# Patient Record
Sex: Female | Born: 1955 | Race: White | Hispanic: No | Marital: Married | Smoking: Former smoker
Health system: Southern US, Community
[De-identification: ages and names within clinical notes are randomized; demographics above are authoritative.]

## PROBLEM LIST (undated history)

## (undated) DIAGNOSIS — Z720 Tobacco use: Secondary | ICD-10-CM

## (undated) DIAGNOSIS — I214 Non-ST elevation (NSTEMI) myocardial infarction: Secondary | ICD-10-CM

## (undated) DIAGNOSIS — E785 Hyperlipidemia, unspecified: Secondary | ICD-10-CM

## (undated) DIAGNOSIS — I509 Heart failure, unspecified: Secondary | ICD-10-CM

## (undated) DIAGNOSIS — M1990 Unspecified osteoarthritis, unspecified site: Secondary | ICD-10-CM

## (undated) DIAGNOSIS — J8410 Pulmonary fibrosis, unspecified: Secondary | ICD-10-CM

## (undated) DIAGNOSIS — J439 Emphysema, unspecified: Secondary | ICD-10-CM

## (undated) HISTORY — PX: SPINE SURGERY: SHX786

## (undated) HISTORY — DX: Emphysema, unspecified: J43.9

## (undated) HISTORY — DX: Non-ST elevation (NSTEMI) myocardial infarction: I21.4

## (undated) HISTORY — DX: Hyperlipidemia, unspecified: E78.5

## (undated) HISTORY — DX: Heart failure, unspecified: I50.9

## (undated) HISTORY — DX: Unspecified osteoarthritis, unspecified site: M19.90

---

## 1998-05-20 ENCOUNTER — Other Ambulatory Visit: Admission: RE | Admit: 1998-05-20 | Discharge: 1998-05-20 | Payer: Self-pay | Admitting: Family Medicine

## 1999-04-29 ENCOUNTER — Encounter: Payer: Self-pay | Admitting: Internal Medicine

## 1999-04-29 ENCOUNTER — Emergency Department (HOSPITAL_COMMUNITY): Admission: EM | Admit: 1999-04-29 | Discharge: 1999-04-29 | Payer: Self-pay | Admitting: Emergency Medicine

## 1999-05-06 ENCOUNTER — Ambulatory Visit (HOSPITAL_COMMUNITY): Admission: RE | Admit: 1999-05-06 | Discharge: 1999-05-06 | Payer: Self-pay | Admitting: Neurosurgery

## 1999-05-06 ENCOUNTER — Encounter: Payer: Self-pay | Admitting: Neurosurgery

## 1999-05-20 ENCOUNTER — Encounter: Payer: Self-pay | Admitting: Neurosurgery

## 1999-05-20 ENCOUNTER — Ambulatory Visit (HOSPITAL_COMMUNITY): Admission: RE | Admit: 1999-05-20 | Discharge: 1999-05-20 | Payer: Self-pay | Admitting: Neurosurgery

## 1999-06-03 ENCOUNTER — Encounter: Payer: Self-pay | Admitting: Neurosurgery

## 1999-06-03 ENCOUNTER — Ambulatory Visit (HOSPITAL_COMMUNITY): Admission: RE | Admit: 1999-06-03 | Discharge: 1999-06-03 | Payer: Self-pay | Admitting: Neurosurgery

## 1999-07-28 ENCOUNTER — Encounter: Admission: RE | Admit: 1999-07-28 | Discharge: 1999-08-23 | Payer: Self-pay | Admitting: Neurosurgery

## 2001-02-03 ENCOUNTER — Ambulatory Visit (HOSPITAL_COMMUNITY): Admission: RE | Admit: 2001-02-03 | Discharge: 2001-02-03 | Payer: Self-pay | Admitting: Neurosurgery

## 2001-02-03 ENCOUNTER — Encounter: Payer: Self-pay | Admitting: Neurosurgery

## 2012-02-15 ENCOUNTER — Other Ambulatory Visit: Payer: Self-pay | Admitting: Orthopaedic Surgery

## 2012-02-15 DIAGNOSIS — M549 Dorsalgia, unspecified: Secondary | ICD-10-CM

## 2012-03-01 ENCOUNTER — Ambulatory Visit
Admission: RE | Admit: 2012-03-01 | Discharge: 2012-03-01 | Disposition: A | Discharge status: Home / Self Care | Payer: No Typology Code available for payment source | Source: Ambulatory Visit | Attending: Orthopaedic Surgery | Admitting: Orthopaedic Surgery

## 2012-03-01 VITALS — BP 93/51 | HR 73 | Ht 62.0 in | Wt 140.0 lb

## 2012-03-01 DIAGNOSIS — M549 Dorsalgia, unspecified: Secondary | ICD-10-CM

## 2012-03-01 MED ORDER — DIAZEPAM 5 MG PO TABS
5.0000 mg | ORAL_TABLET | Freq: Once | ORAL | Status: AC
  Administered 2012-03-01: 5 mg via ORAL

## 2012-03-01 MED ORDER — HYDROMORPHONE HCL PF 1 MG/ML IJ SOLN
1.0000 mg | Freq: Once | INTRAMUSCULAR | Status: AC
  Administered 2012-03-01: 1 mg via INTRAMUSCULAR

## 2012-03-01 MED ORDER — ONDANSETRON HCL 4 MG/2ML IJ SOLN
4.0000 mg | Freq: Once | INTRAMUSCULAR | Status: AC
  Administered 2012-03-01: 4 mg via INTRAMUSCULAR

## 2012-03-01 MED ORDER — IOHEXOL 180 MG/ML  SOLN
15.0000 mL | Freq: Once | INTRAMUSCULAR | Status: AC | PRN
  Administered 2012-03-01: 15 mL via INTRATHECAL

## 2012-03-01 NOTE — Discharge Instructions (Signed)

## 2012-11-06 ENCOUNTER — Ambulatory Visit: Payer: Managed Care, Other (non HMO) | Attending: Orthopaedic Surgery | Admitting: Physical Therapy

## 2012-11-06 DIAGNOSIS — IMO0001 Reserved for inherently not codable concepts without codable children: Secondary | ICD-10-CM | POA: Insufficient documentation

## 2012-11-06 DIAGNOSIS — R5381 Other malaise: Secondary | ICD-10-CM | POA: Insufficient documentation

## 2012-11-06 DIAGNOSIS — R293 Abnormal posture: Secondary | ICD-10-CM | POA: Insufficient documentation

## 2012-11-06 DIAGNOSIS — M545 Low back pain, unspecified: Secondary | ICD-10-CM | POA: Insufficient documentation

## 2012-11-11 ENCOUNTER — Encounter: Payer: BC Managed Care – PPO | Admitting: Physical Therapy

## 2012-11-13 ENCOUNTER — Ambulatory Visit: Payer: Managed Care, Other (non HMO) | Attending: Orthopaedic Surgery | Admitting: Physical Therapy

## 2012-11-13 DIAGNOSIS — R5381 Other malaise: Secondary | ICD-10-CM | POA: Insufficient documentation

## 2012-11-13 DIAGNOSIS — R293 Abnormal posture: Secondary | ICD-10-CM | POA: Insufficient documentation

## 2012-11-13 DIAGNOSIS — IMO0001 Reserved for inherently not codable concepts without codable children: Secondary | ICD-10-CM | POA: Insufficient documentation

## 2012-11-13 DIAGNOSIS — M545 Low back pain, unspecified: Secondary | ICD-10-CM | POA: Insufficient documentation

## 2012-11-19 ENCOUNTER — Ambulatory Visit: Payer: Managed Care, Other (non HMO) | Admitting: Physical Therapy

## 2012-11-21 ENCOUNTER — Ambulatory Visit: Payer: Managed Care, Other (non HMO) | Admitting: Physical Therapy

## 2012-11-28 ENCOUNTER — Ambulatory Visit: Payer: Managed Care, Other (non HMO) | Admitting: Physical Therapy

## 2012-12-03 ENCOUNTER — Ambulatory Visit: Payer: Managed Care, Other (non HMO) | Admitting: Physical Therapy

## 2012-12-05 ENCOUNTER — Ambulatory Visit: Payer: Managed Care, Other (non HMO) | Admitting: Physical Therapy

## 2012-12-10 ENCOUNTER — Ambulatory Visit: Payer: Managed Care, Other (non HMO) | Attending: Orthopaedic Surgery | Admitting: Physical Therapy

## 2012-12-10 DIAGNOSIS — M545 Low back pain, unspecified: Secondary | ICD-10-CM | POA: Insufficient documentation

## 2012-12-10 DIAGNOSIS — IMO0001 Reserved for inherently not codable concepts without codable children: Secondary | ICD-10-CM | POA: Insufficient documentation

## 2012-12-10 DIAGNOSIS — R293 Abnormal posture: Secondary | ICD-10-CM | POA: Insufficient documentation

## 2012-12-10 DIAGNOSIS — R5381 Other malaise: Secondary | ICD-10-CM | POA: Insufficient documentation

## 2012-12-12 ENCOUNTER — Ambulatory Visit: Payer: Managed Care, Other (non HMO) | Admitting: Physical Therapy

## 2012-12-17 ENCOUNTER — Ambulatory Visit: Payer: Managed Care, Other (non HMO) | Admitting: Physical Therapy

## 2012-12-19 ENCOUNTER — Ambulatory Visit: Payer: Managed Care, Other (non HMO) | Admitting: Physical Therapy

## 2012-12-24 ENCOUNTER — Encounter: Payer: BC Managed Care – PPO | Admitting: Physical Therapy

## 2012-12-26 ENCOUNTER — Encounter: Payer: BC Managed Care – PPO | Admitting: Physical Therapy

## 2013-03-26 ENCOUNTER — Other Ambulatory Visit: Payer: Self-pay | Admitting: Orthopaedic Surgery

## 2013-03-26 DIAGNOSIS — M419 Scoliosis, unspecified: Secondary | ICD-10-CM

## 2013-03-29 ENCOUNTER — Ambulatory Visit
Admission: RE | Admit: 2013-03-29 | Discharge: 2013-03-29 | Disposition: A | Discharge status: Home / Self Care | Payer: Managed Care, Other (non HMO) | Source: Ambulatory Visit | Attending: Orthopaedic Surgery | Admitting: Orthopaedic Surgery

## 2013-03-29 DIAGNOSIS — M419 Scoliosis, unspecified: Secondary | ICD-10-CM

## 2013-12-29 ENCOUNTER — Telehealth: Payer: Self-pay | Admitting: General Practice

## 2013-12-29 NOTE — Telephone Encounter (Signed)
Patient reschedual for 4/23

## 2014-01-01 ENCOUNTER — Encounter (INDEPENDENT_AMBULATORY_CARE_PROVIDER_SITE_OTHER): Payer: Self-pay

## 2014-01-01 ENCOUNTER — Encounter: Payer: Self-pay | Admitting: General Practice

## 2014-01-01 ENCOUNTER — Ambulatory Visit (INDEPENDENT_AMBULATORY_CARE_PROVIDER_SITE_OTHER): Payer: PRIVATE HEALTH INSURANCE | Admitting: General Practice

## 2014-01-01 VITALS — BP 128/88 | HR 93 | Temp 97.0°F | Ht 62.0 in | Wt 182.6 lb

## 2014-01-01 DIAGNOSIS — G8929 Other chronic pain: Secondary | ICD-10-CM | POA: Insufficient documentation

## 2014-01-01 DIAGNOSIS — G629 Polyneuropathy, unspecified: Secondary | ICD-10-CM

## 2014-01-01 DIAGNOSIS — Z0000 Encounter for general adult medical examination without abnormal findings: Secondary | ICD-10-CM

## 2014-01-01 DIAGNOSIS — Z7689 Persons encountering health services in other specified circumstances: Secondary | ICD-10-CM

## 2014-01-01 DIAGNOSIS — M549 Dorsalgia, unspecified: Secondary | ICD-10-CM

## 2014-01-01 DIAGNOSIS — M419 Scoliosis, unspecified: Secondary | ICD-10-CM | POA: Insufficient documentation

## 2014-01-01 LAB — POCT CBC
GRANULOCYTE PERCENT: 63.2 % (ref 37–80)
HCT, POC: 49.5 % — AB (ref 37.7–47.9)
Hemoglobin: 16 g/dL (ref 12.2–16.2)
LYMPH, POC: 2.6 (ref 0.6–3.4)
MCH, POC: 28.8 pg (ref 27–31.2)
MCHC: 32.4 g/dL (ref 31.8–35.4)
MCV: 89 fL (ref 80–97)
MPV: 7.3 fL (ref 0–99.8)
PLATELET COUNT, POC: 284 10*3/uL (ref 142–424)
POC GRANULOCYTE: 4.8 (ref 2–6.9)
POC LYMPH %: 34.7 % (ref 10–50)
RBC: 5.6 M/uL — AB (ref 4.04–5.48)
RDW, POC: 12.4 %
WBC: 7.6 10*3/uL (ref 4.6–10.2)

## 2014-01-01 NOTE — Patient Instructions (Signed)

## 2014-01-01 NOTE — Progress Notes (Signed)
   Subjective:    Patient ID: Dawn Nichols, female    DOB: 09-12-1955, 58 y.o.   MRN: [id number]  HPI Patient presents today to establish care. She denies having a complete physical or labs drawn in many years. Denies any known chronic health conditions. Reports having back surgery in August 2013 at [facility]. Also being followed by Spine/Scoliosis specilaist in once monthly, were she is receiving gabepentin, meloxicam, and percocet 10/325 (1 tablet, 4 times daily). Patient will continue to be managed by this specialist for back pain and get medications related from there. Smoking 1ppd x 40 years. Denies wanting to stop smoking at this time.    Review of Systems  Constitutional: Negative for fever and chills.  Respiratory: Negative for chest tightness and shortness of breath.   Cardiovascular: Negative for chest pain and palpitations.  Gastrointestinal: Negative for nausea, vomiting, abdominal pain, diarrhea, constipation and blood in stool.  Musculoskeletal: Positive for back pain.       Chronic left leg and back pain  Neurological: Negative for dizziness, weakness and headaches.  Psychiatric/Behavioral: Negative for suicidal ideas, sleep disturbance and self-injury. The patient is not nervous/anxious.        Objective:   Physical Exam  Constitutional: She is oriented to person, place, and time. She appears well-developed and well-nourished.  HENT:  Head: Normocephalic and atraumatic.  Right Ear: External ear normal.  Left Ear: External ear normal.  Mouth/Throat: Oropharynx is clear and moist.  Eyes: EOM are normal. Pupils are equal, round, and reactive to light.  Neck: Normal range of motion. Neck supple. No thyromegaly present.  Cardiovascular: Normal rate, regular rhythm and normal heart sounds.   Pulmonary/Chest: Effort normal and breath sounds normal. No respiratory distress. She exhibits no tenderness.  Abdominal: Soft. Bowel sounds are normal. She exhibits no  distension. There is no tenderness.  Lymphadenopathy:    She has no cervical adenopathy.  Neurological: She is alert and oriented to person, place, and time.  Skin: Skin is warm and dry.  Psychiatric: She has a normal mood and affect.          Assessment & Plan:  1. Annual physical exam  - POCT CBC - CMP14+EGFR - Lipid panel  2. Encounter to establish care Labs pending Discussed benefits of healthy eating RTO prn Patient verbalized understanding Erby Pian, FNP-C

## 2014-01-02 LAB — CMP14+EGFR
ALBUMIN: 4.4 g/dL (ref 3.5–5.5)
ALT: 12 IU/L (ref 0–32)
AST: 18 IU/L (ref 0–40)
Albumin/Globulin Ratio: 1.8 (ref 1.1–2.5)
Alkaline Phosphatase: 40 IU/L (ref 39–117)
BUN/Creatinine Ratio: 7 — ABNORMAL LOW (ref 9–23)
BUN: 6 mg/dL (ref 6–24)
CALCIUM: 9.7 mg/dL (ref 8.7–10.2)
CHLORIDE: 101 mmol/L (ref 97–108)
CO2: 25 mmol/L (ref 18–29)
CREATININE: 0.82 mg/dL (ref 0.57–1.00)
GFR calc Af Amer: 93 mL/min/{1.73_m2} (ref >59)
GFR calc non Af Amer: 80 mL/min/{1.73_m2} (ref >59)
GLOBULIN, TOTAL: 2.5 g/dL (ref 1.5–4.5)
Glucose: 96 mg/dL (ref 65–99)
Potassium: 5 mmol/L (ref 3.5–5.2)
Sodium: 140 mmol/L (ref 134–144)
Total Bilirubin: 0.4 mg/dL (ref 0.0–1.2)
Total Protein: 6.9 g/dL (ref 6.0–8.5)

## 2014-01-02 LAB — LIPID PANEL
CHOL/HDL RATIO: 4.6 ratio — AB (ref 0.0–4.4)
CHOLESTEROL TOTAL: 158 mg/dL (ref 100–199)
HDL: 34 mg/dL — AB (ref >39)
LDL CALC: 107 mg/dL — AB (ref 0–99)
TRIGLYCERIDES: 87 mg/dL (ref 0–149)
VLDL Cholesterol Cal: 17 mg/dL (ref 5–40)

## 2014-01-04 ENCOUNTER — Encounter: Payer: Self-pay | Admitting: General Practice

## 2014-01-04 NOTE — Progress Notes (Signed)
This encounter was created in error - please disregard.

## 2014-01-06 ENCOUNTER — Ambulatory Visit: Payer: Self-pay | Admitting: General Practice

## 2017-05-01 ENCOUNTER — Other Ambulatory Visit: Payer: Self-pay | Admitting: Orthopaedic Surgery

## 2017-05-01 DIAGNOSIS — M546 Pain in thoracic spine: Secondary | ICD-10-CM

## 2017-05-01 DIAGNOSIS — M545 Low back pain, unspecified: Secondary | ICD-10-CM

## 2017-05-16 ENCOUNTER — Ambulatory Visit
Admission: RE | Admit: 2017-05-16 | Discharge: 2017-05-16 | Disposition: A | Discharge status: Home / Self Care | Payer: 59 | Source: Ambulatory Visit | Attending: Orthopaedic Surgery | Admitting: Orthopaedic Surgery

## 2017-05-16 ENCOUNTER — Ambulatory Visit
Admission: RE | Admit: 2017-05-16 | Discharge: 2017-05-16 | Disposition: A | Discharge status: Home / Self Care | Payer: Managed Care, Other (non HMO) | Source: Ambulatory Visit | Attending: Orthopaedic Surgery | Admitting: Orthopaedic Surgery

## 2017-05-16 DIAGNOSIS — M545 Low back pain, unspecified: Secondary | ICD-10-CM

## 2017-05-16 DIAGNOSIS — M546 Pain in thoracic spine: Secondary | ICD-10-CM

## 2018-06-24 ENCOUNTER — Encounter (HOSPITAL_COMMUNITY): Payer: Self-pay

## 2018-06-24 ENCOUNTER — Inpatient Hospital Stay (HOSPITAL_COMMUNITY)
Admission: AD | Admit: 2018-06-24 | Discharge: 2018-06-28 | DRG: 280 | Disposition: A | Discharge status: Home / Self Care | Payer: 59 | Source: Other Acute Inpatient Hospital | Attending: Internal Medicine | Admitting: Internal Medicine

## 2018-06-24 ENCOUNTER — Other Ambulatory Visit: Payer: Self-pay

## 2018-06-24 DIAGNOSIS — I2510 Atherosclerotic heart disease of native coronary artery without angina pectoris: Secondary | ICD-10-CM | POA: Diagnosis present

## 2018-06-24 DIAGNOSIS — I2699 Other pulmonary embolism without acute cor pulmonale: Secondary | ICD-10-CM

## 2018-06-24 DIAGNOSIS — E669 Obesity, unspecified: Secondary | ICD-10-CM | POA: Diagnosis present

## 2018-06-24 DIAGNOSIS — G8929 Other chronic pain: Secondary | ICD-10-CM | POA: Diagnosis present

## 2018-06-24 DIAGNOSIS — I513 Intracardiac thrombosis, not elsewhere classified: Secondary | ICD-10-CM | POA: Diagnosis present

## 2018-06-24 DIAGNOSIS — E876 Hypokalemia: Secondary | ICD-10-CM | POA: Diagnosis present

## 2018-06-24 DIAGNOSIS — D72829 Elevated white blood cell count, unspecified: Secondary | ICD-10-CM | POA: Diagnosis present

## 2018-06-24 DIAGNOSIS — J449 Chronic obstructive pulmonary disease, unspecified: Secondary | ICD-10-CM | POA: Diagnosis present

## 2018-06-24 DIAGNOSIS — Z79899 Other long term (current) drug therapy: Secondary | ICD-10-CM

## 2018-06-24 DIAGNOSIS — I236 Thrombosis of atrium, auricular appendage, and ventricle as current complications following acute myocardial infarction: Secondary | ICD-10-CM | POA: Diagnosis not present

## 2018-06-24 DIAGNOSIS — R748 Abnormal levels of other serum enzymes: Secondary | ICD-10-CM

## 2018-06-24 DIAGNOSIS — I214 Non-ST elevation (NSTEMI) myocardial infarction: Secondary | ICD-10-CM | POA: Diagnosis present

## 2018-06-24 DIAGNOSIS — R918 Other nonspecific abnormal finding of lung field: Secondary | ICD-10-CM | POA: Diagnosis not present

## 2018-06-24 DIAGNOSIS — R0609 Other forms of dyspnea: Secondary | ICD-10-CM

## 2018-06-24 DIAGNOSIS — J9601 Acute respiratory failure with hypoxia: Secondary | ICD-10-CM | POA: Diagnosis present

## 2018-06-24 DIAGNOSIS — I509 Heart failure, unspecified: Secondary | ICD-10-CM | POA: Diagnosis not present

## 2018-06-24 DIAGNOSIS — J439 Emphysema, unspecified: Secondary | ICD-10-CM | POA: Diagnosis not present

## 2018-06-24 DIAGNOSIS — R0902 Hypoxemia: Secondary | ICD-10-CM | POA: Diagnosis not present

## 2018-06-24 DIAGNOSIS — F17210 Nicotine dependence, cigarettes, uncomplicated: Secondary | ICD-10-CM | POA: Diagnosis present

## 2018-06-24 DIAGNOSIS — I240 Acute coronary thrombosis not resulting in myocardial infarction: Secondary | ICD-10-CM

## 2018-06-24 DIAGNOSIS — G629 Polyneuropathy, unspecified: Secondary | ICD-10-CM | POA: Diagnosis present

## 2018-06-24 DIAGNOSIS — I5030 Unspecified diastolic (congestive) heart failure: Secondary | ICD-10-CM | POA: Diagnosis not present

## 2018-06-24 DIAGNOSIS — I5031 Acute diastolic (congestive) heart failure: Secondary | ICD-10-CM | POA: Diagnosis present

## 2018-06-24 DIAGNOSIS — M545 Low back pain: Secondary | ICD-10-CM | POA: Diagnosis present

## 2018-06-24 DIAGNOSIS — J432 Centrilobular emphysema: Secondary | ICD-10-CM | POA: Diagnosis present

## 2018-06-24 DIAGNOSIS — M419 Scoliosis, unspecified: Secondary | ICD-10-CM | POA: Diagnosis present

## 2018-06-24 DIAGNOSIS — R0602 Shortness of breath: Secondary | ICD-10-CM | POA: Diagnosis present

## 2018-06-24 HISTORY — DX: Tobacco use: Z72.0

## 2018-06-24 LAB — CBC WITH DIFFERENTIAL/PLATELET
Abs Immature Granulocytes: 0.05 10*3/uL (ref 0.00–0.07)
Basophils Absolute: 0.1 10*3/uL (ref 0.0–0.1)
Basophils Relative: 0 %
EOS PCT: 0 %
Eosinophils Absolute: 0 10*3/uL (ref 0.0–0.5)
HEMATOCRIT: 43.6 % (ref 36.0–46.0)
HEMOGLOBIN: 13.8 g/dL (ref 12.0–15.0)
Immature Granulocytes: 0 %
LYMPHS ABS: 2.6 10*3/uL (ref 0.7–4.0)
Lymphocytes Relative: 21 %
MCH: 27.8 pg (ref 26.0–34.0)
MCHC: 31.7 g/dL (ref 30.0–36.0)
MCV: 87.9 fL (ref 80.0–100.0)
MONO ABS: 0.7 10*3/uL (ref 0.1–1.0)
Monocytes Relative: 6 %
Neutro Abs: 8.6 10*3/uL — ABNORMAL HIGH (ref 1.7–7.7)
Neutrophils Relative %: 73 %
PLATELETS: 300 10*3/uL (ref 150–400)
RBC: 4.96 MIL/uL (ref 3.87–5.11)
RDW: 13.5 % (ref 11.5–15.5)
WBC: 12 10*3/uL — AB (ref 4.0–10.5)
nRBC: 0 % (ref 0.0–0.2)

## 2018-06-24 NOTE — Plan of Care (Signed)
  Problem: Education: Goal: Knowledge of General Education information will improve Description: Including pain rating scale, medication(s)/side effects and non-pharmacologic comfort measures Outcome: Progressing   Problem: Health Behavior/Discharge Planning: Goal: Ability to manage health-related needs will improve Outcome: Progressing   Problem: Clinical Measurements: Goal: Ability to maintain clinical measurements within normal limits will improve Outcome: Progressing   Problem: Clinical Measurements: Goal: Diagnostic test results will improve Outcome: Progressing   Problem: Clinical Measurements: Goal: Respiratory complications will improve Outcome: Progressing   

## 2018-06-24 NOTE — Progress Notes (Signed)
Patient arrived to unit via [facility]link. Patient alert and oriented x4. [facility] paged to notify of patient's arrival. CCMD notified. Awaiting new orders. Will continue to monitor patient.

## 2018-06-24 NOTE — Care Management (Signed)
This is a no charge note  Transfer from [facility] per Dr. Ramiro Harvest  62 year old lady with past medical history of tobacco abuse, scoliosis, back pain, peripheral neuropathy, who presents with weakness and shortness of breath.  Patient was found to have an elevated BNP 5000, troponin 0.45, no chest pain, EKG showed T wave inversion.  CT angiogram showed bilateral small PE. Blood pressure 117/89, heart rate 87, RR 20, oxygen saturation 91% on room air. IV heparin was started. Pt is accepted to tele bed as inpt. Card, Dr. Radford Pax was consulted by EDP.   Please call manager of [facility]ists at [phone] when pt arrives to floor   Ivor Costa, MD  [facility] Pager [phone]  If 7PM-7AM, please contact night-coverage [email or web address] Password [email or web address] 06/24/2018, 9:17 PM

## 2018-06-24 NOTE — Consult Note (Signed)
Cardiology Consultation Note    Patient ID: Dawn Nichols, MRN: [id number], DOB/AGE: [birth year] 61 y.o. Admit date: 06/24/2018   Date of Consult: 06/24/2018 Primary Physician: Patient, No Pcp Per  Reason for Consultation: Elevated troponin Requesting MD: Dr. Blaine Hamper  HPI: Dawn Nichols is a 62 y.o. female with a history of extensive tobacco use, who presents with worsening shortness of breath and weakness over the past 2-3 weeks.  The patient was in her usual state of health until 2 to 3 weeks ago, when she is at the beach and first develops a GI bug.  Upon going home she felt better but after that slowly had progressively worsening shortness of breath.  He denies having chest pains at any point.  She has had issues with lower extremity edema for several months.  Dawn Nichols her progressive shortness of breath, she presented to the [facility] ED.  There, she was hypoxemic requiring 4 to 5 L nasal cannula.  Labs were notable for a normal BMP and CBC, NT proBNP of over 5000, troponin T of 0.48.  She underwent a CT PE protocol, which showed small bilateral pulmonary emboli, severe emphysema, concern for right lower lobe mass and possible infectious/inflammatory changes in the right lung.  Patient was given a Lasix bolus with significant urine output, however her dyspnea was not improved with this.  She was started on a heparin drip and was transferred to [facility] for further evaluation.  Upon my interview, she reports continued shortness of breath, but denies any other focal complaints, including chest pain.  History reviewed. No pertinent past medical history.    Surgical History:  Past Surgical History:  Procedure Laterality Date  . CESAREAN SECTION     Two  . SPINE SURGERY       Home Meds: Prior to Admission medications   Medication Sig Start Date End Date Taking? Authorizing Provider  amoxicillin (AMOXIL) 500 MG capsule  12/23/13   [provider]  gabapentin (NEURONTIN) 600 MG tablet  Take 600 mg by mouth as needed. 12/07/13   [provider]  meloxicam (MOBIC) 15 MG tablet Take 15 mg by mouth daily. 12/29/13   [provider]  oxyCODONE-acetaminophen (PERCOCET) 10-325 MG per tablet Take 10-32 tablets by mouth 3 (three) times daily. PRN up to three times daily 12/12/13   [provider]    Inpatient Medications:     Allergies: No Known Allergies  Social History   Socioeconomic History  . Marital status: Married    Spouse name: Not on file  . Number of children: Not on file  . Years of education: Not on file  . Highest education level: Not on file  Occupational History  . Not on file  Social Needs  . Financial resource strain: Not on file  . Food insecurity:    Worry: Not on file    Inability: Not on file  . Transportation needs:    Medical: Not on file    Non-medical: Not on file  Tobacco Use  . Smoking status: Current Every Day Smoker    Packs/day: 1.00    Years: 38.00    Pack years: 38.00    Types: Cigarettes  . Smokeless tobacco: Never Used  Substance and Sexual Activity  . Alcohol use: Not on file  . Drug use: Not on file  . Sexual activity: Not on file  Lifestyle  . Physical activity:    Days per week: Not on file  Minutes per session: Not on file  . Stress: Not on file  Relationships  . Social connections:    Talks on phone: Not on file    Gets together: Not on file    Attends religious service: Not on file    Active member of club or organization: Not on file    Attends meetings of clubs or organizations: Not on file    Relationship status: Not on file  . Intimate partner violence:    Fear of current or ex partner: Not on file    Emotionally abused: Not on file    Physically abused: Not on file    Forced sexual activity: Not on file  Other Topics Concern  . Not on file  Social History Narrative  . Not on file     Family History  Problem Relation Age of Onset  . Cancer Mother   . Cancer Father         Brain tumor     Review of Systems: All other systems reviewed and are otherwise negative except as noted above.  Labs: No results for input(s): CKTOTAL, CKMB, TROPONINI in the last 72 hours. Lab Results  Component Value Date   WBC 7.6 01/01/2014   HGB 16.0 01/01/2014   HCT 49.5 (A) 01/01/2014   MCV 89.0 01/01/2014   No results for input(s): NA, K, CL, CO2, BUN, CREATININE, CALCIUM, PROT, BILITOT, ALKPHOS, ALT, AST, GLUCOSE in the last 168 hours.  Invalid input(s): LABALBU Lab Results  Component Value Date   CHOL 158 01/01/2014   HDL 34 (L) 01/01/2014   LDLCALC 107 (H) 01/01/2014   TRIG 87 01/01/2014   No results found for: DDIMER  Radiology/Studies:  No results found.  Wt Readings from Last 3 Encounters:  06/24/18 80.2 kg  01/01/14 82.8 kg  03/01/12 63.5 kg    EKG: Ordered and pending.  Physical Exam: Blood pressure 113/81, pulse 81, temperature 98.3 F (36.8 C), temperature source Oral, resp. rate 18, height 5\' 3"  (1.6 m), weight 80.2 kg, SpO2 99 %. Body mass index is 31.32 kg/m. General: Well developed, well nourished, in no acute distress. Head: Normocephalic, atraumatic, sclera non-icteric, no xanthomas, nares are without discharge.  Neck: Negative for carotid bruits.  JVP elevated to angle of mandible Lungs: Bibasilar crackles, mildly dyspneic  heart: RRR with S1 S2. No murmurs, rubs, or gallops appreciated. Abdomen: Soft, non-tender, non-distended with normoactive bowel sounds. No hepatomegaly. No rebound/guarding. No obvious abdominal masses. Msk:  Strength and tone appear normal for age. Extremities: No clubbing or cyanosis.  2+ pitting edema bilaterally.  Distal pedal pulses are 2+ and equal bilaterally. Neuro: Alert and oriented X 3. No facial asymmetry. No focal deficit. Moves all extremities spontaneously. Psych:  Responds to questions appropriately with a normal affect.     Assessment and Plan  62 year old lady who presents with progressive dyspnea  that is multifactorial in etiology.  She does have small bilateral pulmonary emboli, and a concern for a right lower lobe lung mass seen on her chest CT.   The patient also has evidence of volume overload on exam.  Her troponin is elevated, but she does not have any symptoms concerning for acute plaque rupture physiology.  More likely, this represents demand ischemia in the context of PE, heart failure, new lung mass, and resulting hypoxemia from all the above.  She will continue on IV heparin for her pulmonary emboli.  Plan to check echocardiogram in the morning.  Would continue gentle diuresis (  as the PEs are small, do not suspect that she is preload dependent).  Work-up of lung mass per primary team.  We will follow along with you.  Signed, Doylene Canning, MD 06/24/2018, 11:01 PM

## 2018-06-25 ENCOUNTER — Inpatient Hospital Stay (HOSPITAL_COMMUNITY): Payer: 59

## 2018-06-25 ENCOUNTER — Encounter (HOSPITAL_COMMUNITY): Payer: Self-pay | Admitting: Internal Medicine

## 2018-06-25 DIAGNOSIS — I5030 Unspecified diastolic (congestive) heart failure: Secondary | ICD-10-CM

## 2018-06-25 DIAGNOSIS — J432 Centrilobular emphysema: Secondary | ICD-10-CM | POA: Diagnosis present

## 2018-06-25 DIAGNOSIS — I214 Non-ST elevation (NSTEMI) myocardial infarction: Secondary | ICD-10-CM

## 2018-06-25 DIAGNOSIS — R918 Other nonspecific abnormal finding of lung field: Secondary | ICD-10-CM

## 2018-06-25 DIAGNOSIS — I5031 Acute diastolic (congestive) heart failure: Secondary | ICD-10-CM

## 2018-06-25 DIAGNOSIS — J439 Emphysema, unspecified: Secondary | ICD-10-CM

## 2018-06-25 DIAGNOSIS — I236 Thrombosis of atrium, auricular appendage, and ventricle as current complications following acute myocardial infarction: Secondary | ICD-10-CM

## 2018-06-25 DIAGNOSIS — R0902 Hypoxemia: Secondary | ICD-10-CM

## 2018-06-25 DIAGNOSIS — I252 Old myocardial infarction: Secondary | ICD-10-CM | POA: Insufficient documentation

## 2018-06-25 DIAGNOSIS — I2699 Other pulmonary embolism without acute cor pulmonale: Secondary | ICD-10-CM

## 2018-06-25 DIAGNOSIS — I509 Heart failure, unspecified: Secondary | ICD-10-CM

## 2018-06-25 HISTORY — DX: Non-ST elevation (NSTEMI) myocardial infarction: I21.4

## 2018-06-25 LAB — COMPREHENSIVE METABOLIC PANEL
ALBUMIN: 2.9 g/dL — AB (ref 3.5–5.0)
ALK PHOS: 28 U/L — AB (ref 38–126)
ALT: 109 U/L — AB (ref 0–44)
AST: 100 U/L — AB (ref 15–41)
Anion gap: 10 (ref 5–15)
BUN: 11 mg/dL (ref 8–23)
CALCIUM: 8.4 mg/dL — AB (ref 8.9–10.3)
CO2: 32 mmol/L (ref 22–32)
Chloride: 100 mmol/L (ref 98–111)
Creatinine, Ser: 0.85 mg/dL (ref 0.44–1.00)
GFR calc Af Amer: >60 mL/min (ref >60)
GFR calc non Af Amer: >60 mL/min (ref >60)
GLUCOSE: 107 mg/dL — AB (ref 70–99)
Potassium: 3.2 mmol/L — ABNORMAL LOW (ref 3.5–5.1)
SODIUM: 142 mmol/L (ref 135–145)
TOTAL PROTEIN: 7 g/dL (ref 6.5–8.1)
Total Bilirubin: 1.1 mg/dL (ref 0.3–1.2)

## 2018-06-25 LAB — RESPIRATORY PANEL BY PCR
Adenovirus: NOT DETECTED
Bordetella pertussis: NOT DETECTED
CORONAVIRUS 229E-RVPPCR: NOT DETECTED
CORONAVIRUS HKU1-RVPPCR: NOT DETECTED
CORONAVIRUS NL63-RVPPCR: NOT DETECTED
CORONAVIRUS OC43-RVPPCR: NOT DETECTED
Chlamydophila pneumoniae: NOT DETECTED
INFLUENZA A-RVPPCR: NOT DETECTED
INFLUENZA B-RVPPCR: NOT DETECTED
Influenza A H1 2009: NOT DETECTED
Influenza A H1: NOT DETECTED
Influenza A H3: NOT DETECTED
Metapneumovirus: NOT DETECTED
Mycoplasma pneumoniae: NOT DETECTED
PARAINFLUENZA VIRUS 1-RVPPCR: NOT DETECTED
PARAINFLUENZA VIRUS 3-RVPPCR: NOT DETECTED
PARAINFLUENZA VIRUS 4-RVPPCR: NOT DETECTED
Parainfluenza Virus 2: NOT DETECTED
RHINOVIRUS / ENTEROVIRUS - RVPPCR: NOT DETECTED
Respiratory Syncytial Virus: NOT DETECTED

## 2018-06-25 LAB — CBC
HCT: 41.1 % (ref 36.0–46.0)
Hemoglobin: 13.5 g/dL (ref 12.0–15.0)
MCH: 28.8 pg (ref 26.0–34.0)
MCHC: 32.8 g/dL (ref 30.0–36.0)
MCV: 87.6 fL (ref 80.0–100.0)
PLATELETS: 277 10*3/uL (ref 150–400)
RBC: 4.69 MIL/uL (ref 3.87–5.11)
RDW: 13.5 % (ref 11.5–15.5)
WBC: 11.9 10*3/uL — ABNORMAL HIGH (ref 4.0–10.5)
nRBC: 0 % (ref 0.0–0.2)

## 2018-06-25 LAB — URINALYSIS, ROUTINE W REFLEX MICROSCOPIC
BILIRUBIN URINE: NEGATIVE
Glucose, UA: NEGATIVE mg/dL
HGB URINE DIPSTICK: NEGATIVE
Ketones, ur: NEGATIVE mg/dL
LEUKOCYTES UA: NEGATIVE
NITRITE: NEGATIVE
PROTEIN: 30 mg/dL — AB
Specific Gravity, Urine: 1.039 — ABNORMAL HIGH (ref 1.005–1.030)
pH: 6 (ref 5.0–8.0)

## 2018-06-25 LAB — ECHOCARDIOGRAM COMPLETE
Height: 63 in
Weight: 2820.8 oz

## 2018-06-25 LAB — BRAIN NATRIURETIC PEPTIDE: B NATRIURETIC PEPTIDE 5: 458.3 pg/mL — AB (ref 0.0–100.0)

## 2018-06-25 LAB — PROCALCITONIN: Procalcitonin: <0.1 ng/mL

## 2018-06-25 LAB — HIV ANTIBODY (ROUTINE TESTING W REFLEX): HIV SCREEN 4TH GENERATION: NONREACTIVE

## 2018-06-25 LAB — TROPONIN I
Troponin I: 1.76 ng/mL (ref <0.03)
Troponin I: 1.83 ng/mL (ref <0.03)
Troponin I: 1.09 ng/mL (ref <0.03)

## 2018-06-25 LAB — MAGNESIUM: Magnesium: 1.9 mg/dL (ref 1.7–2.4)

## 2018-06-25 LAB — C-REACTIVE PROTEIN: CRP: 6.4 mg/dL — ABNORMAL HIGH (ref <1.0)

## 2018-06-25 LAB — SEDIMENTATION RATE: Sed Rate: 30 mm/hr — ABNORMAL HIGH (ref 0–22)

## 2018-06-25 LAB — HEPARIN LEVEL (UNFRACTIONATED): Heparin Unfractionated: <0.1 IU/mL — ABNORMAL LOW (ref 0.30–0.70)

## 2018-06-25 MED ORDER — ASPIRIN 81 MG PO CHEW
324.0000 mg | CHEWABLE_TABLET | Freq: Once | ORAL | Status: AC
  Administered 2018-06-25: 324 mg via ORAL
  Filled 2018-06-25: qty 4

## 2018-06-25 MED ORDER — GABAPENTIN 600 MG PO TABS
600.0000 mg | ORAL_TABLET | Freq: Every day | ORAL | Status: DC | PRN
  Administered 2018-06-27: 600 mg via ORAL
  Filled 2018-06-25: qty 1

## 2018-06-25 MED ORDER — HEPARIN BOLUS VIA INFUSION
2000.0000 [IU] | Freq: Once | INTRAVENOUS | Status: AC
  Administered 2018-06-25: 2000 [IU] via INTRAVENOUS
  Filled 2018-06-25: qty 2000

## 2018-06-25 MED ORDER — ATORVASTATIN CALCIUM 80 MG PO TABS
80.0000 mg | ORAL_TABLET | Freq: Every day | ORAL | Status: DC
  Administered 2018-06-25 – 2018-06-27 (×3): 80 mg via ORAL
  Filled 2018-06-25 (×2): qty 1
  Filled 2018-06-25: qty 2

## 2018-06-25 MED ORDER — POTASSIUM CHLORIDE CRYS ER 20 MEQ PO TBCR
40.0000 meq | EXTENDED_RELEASE_TABLET | Freq: Once | ORAL | Status: AC
  Administered 2018-06-25: 40 meq via ORAL
  Filled 2018-06-25: qty 2

## 2018-06-25 MED ORDER — FUROSEMIDE 10 MG/ML IJ SOLN
20.0000 mg | Freq: Every day | INTRAMUSCULAR | Status: DC
  Administered 2018-06-25 – 2018-06-28 (×4): 20 mg via INTRAVENOUS
  Filled 2018-06-25 (×4): qty 2

## 2018-06-25 MED ORDER — ONDANSETRON HCL 4 MG PO TABS: 4.0000 mg | ORAL_TABLET | Freq: Four times a day (QID) | ORAL | Status: DC | PRN

## 2018-06-25 MED ORDER — ASPIRIN 81 MG PO CHEW
81.0000 mg | CHEWABLE_TABLET | Freq: Every day | ORAL | Status: DC
  Administered 2018-06-26: 81 mg via ORAL
  Filled 2018-06-25: qty 1

## 2018-06-25 MED ORDER — OXYCODONE-ACETAMINOPHEN 10-325 MG PO TABS: 1.0000 | ORAL_TABLET | Freq: Three times a day (TID) | ORAL | Status: DC | PRN

## 2018-06-25 MED ORDER — OXYCODONE-ACETAMINOPHEN 5-325 MG PO TABS
1.0000 | ORAL_TABLET | Freq: Three times a day (TID) | ORAL | Status: DC | PRN
  Administered 2018-06-25 – 2018-06-28 (×6): 1 via ORAL
  Filled 2018-06-25 (×6): qty 1

## 2018-06-25 MED ORDER — HEPARIN (PORCINE) IN NACL 100-0.45 UNIT/ML-% IJ SOLN
1700.0000 [IU]/h | INTRAMUSCULAR | Status: DC
  Administered 2018-06-26: 1500 [IU]/h via INTRAVENOUS
  Administered 2018-06-26: 1700 [IU]/h via INTRAVENOUS
  Filled 2018-06-25: qty 250

## 2018-06-25 MED ORDER — ACETAMINOPHEN 650 MG RE SUPP: 650.0000 mg | Freq: Four times a day (QID) | RECTAL | Status: DC | PRN

## 2018-06-25 MED ORDER — ONDANSETRON HCL 4 MG/2ML IJ SOLN: 4.0000 mg | Freq: Four times a day (QID) | INTRAMUSCULAR | Status: DC | PRN

## 2018-06-25 MED ORDER — HEPARIN (PORCINE) IN NACL 100-0.45 UNIT/ML-% IJ SOLN
1300.0000 [IU]/h | INTRAMUSCULAR | Status: DC
  Administered 2018-06-25 (×2): 1300 [IU]/h via INTRAVENOUS
  Administered 2018-06-25: 1000 [IU]/h via INTRAVENOUS
  Administered 2018-06-25: 1300 [IU]/h via INTRAVENOUS
  Filled 2018-06-25: qty 250

## 2018-06-25 MED ORDER — FUROSEMIDE 10 MG/ML IJ SOLN
20.0000 mg | Freq: Once | INTRAMUSCULAR | Status: AC
  Administered 2018-06-25: 20 mg via INTRAVENOUS
  Filled 2018-06-25: qty 2

## 2018-06-25 MED ORDER — ACETAMINOPHEN 325 MG PO TABS: 650.0000 mg | ORAL_TABLET | Freq: Four times a day (QID) | ORAL | Status: DC | PRN

## 2018-06-25 MED ORDER — OXYCODONE HCL 5 MG PO TABS
5.0000 mg | ORAL_TABLET | Freq: Three times a day (TID) | ORAL | Status: DC | PRN
  Administered 2018-06-25 – 2018-06-28 (×10): 5 mg via ORAL
  Filled 2018-06-25 (×10): qty 1

## 2018-06-25 MED ORDER — HEPARIN BOLUS VIA INFUSION
2000.0000 [IU] | Freq: Once | INTRAVENOUS | Status: AC
  Administered 2018-06-26: 2000 [IU] via INTRAVENOUS
  Filled 2018-06-25: qty 2000

## 2018-06-25 MED FILL — Heparin Sodium (Porcine) 100 Unt/ML in Sodium Chloride 0.45%: INTRAMUSCULAR | Qty: 250 | Status: AC

## 2018-06-25 NOTE — H&P (Signed)
History and Physical    QUANIYA DAMAS HYI:[id number] DOB: Aug 28, 1956 DOA: 06/24/2018  PCP: Patient, No Pcp Per  Patient coming from: Patient was transferred from [facility]  Chief Complaint: Shortness of breath.  HPI: Dawn Nichols is a 62 y.o. female with history of tobacco abuse and scoliosis with low back pain and peripheral neuropathy has been experiencing shortness of breath on exertion which has been progressively worsening over the last 3 weeks.  Patient symptoms started after she visited the beach.  Denies any chest pain productive cough fever or chills.  ED Course: In the ER at [facility] patient had CT angiogram of the chest which was positive for bilateral PE scan also showed features concerning for malignancy.  Patient's troponin was positive at 0.45 with EKG showing T wave inversions anterolateral leads.  Patient was started on heparin.  On-call cardiologist at [facility] Dr. Radford Pax was consulted and patient transferred here.  On my exam patient is not in distress mildly short of breath.  Has already received 60 mg IV Lasix at [facility].  Cardiologist has already reviewed the patient.  Patient admitted for pulmonary embolism acute CHF non-ST elevation MI and abnormal lesions in the lung.  Review of Systems: As per HPI, rest all negative.   History reviewed. No pertinent past medical history.  Past Surgical History:  Procedure Laterality Date  . CESAREAN SECTION     Two  . SPINE SURGERY       reports that she has been smoking cigarettes. She has a 38.00 pack-year smoking history. She has never used smokeless tobacco. She reports that she drank alcohol. Her drug history is not on file.  No Known Allergies  Family History  Problem Relation Age of Onset  . Cancer Mother   . Cancer Father        Brain tumor    Prior to Admission medications   Medication Sig Start Date End Date Taking? Authorizing Provider  amoxicillin (AMOXIL) 500 MG capsule  12/23/13   [provider]  gabapentin (NEURONTIN) 600 MG tablet Take 600 mg by mouth as needed. 12/07/13   [provider]  meloxicam (MOBIC) 15 MG tablet Take 15 mg by mouth daily. 12/29/13   [provider]  oxyCODONE-acetaminophen (PERCOCET) 10-325 MG per tablet Take 10-32 tablets by mouth 3 (three) times daily. PRN up to three times daily 12/12/13   [provider]    Physical Exam: Vitals:   06/24/18 2218 06/24/18 2359  BP: 113/81 122/79  Pulse: 81 82  Resp: 18 18  Temp: 98.3 F (36.8 C) 98.5 F (36.9 C)  TempSrc: Oral Oral  SpO2: 99% 93%  Weight: 80.2 kg   Height: 5\' 3"  (1.6 m)       Constitutional: Moderately built and nourished. Vitals:   06/24/18 2218 06/24/18 2359  BP: 113/81 122/79  Pulse: 81 82  Resp: 18 18  Temp: 98.3 F (36.8 C) 98.5 F (36.9 C)  TempSrc: Oral Oral  SpO2: 99% 93%  Weight: 80.2 kg   Height: 5\' 3"  (1.6 m)    Eyes: Anicteric no pallor. ENMT: No discharge from the ears eyes nose or mouth. Neck: No mass felt.  No neck rigidity.  No JVD appreciated. Respiratory: No rhonchi or crepitations. Cardiovascular: S1-S2 heard no murmurs appreciated. Abdomen: Soft nontender bowel sounds present. Musculoskeletal: No edema.  No joint effusion. Skin: No rash. Neurologic: Alert awake oriented to time place and person.  Moves all extremities. Psychiatric: Appears normal  per normal affect.   Labs on Admission: I have personally reviewed following labs and imaging studies  CBC: Recent Labs  Lab 06/24/18 2325  WBC 12.0*  NEUTROABS 8.6*  HGB 13.8  HCT 43.6  MCV 87.9  PLT 703   Basic Metabolic Panel: Recent Labs  Lab 06/24/18 2325  NA 142  K 3.2*  CL 100  CO2 32  GLUCOSE 107*  BUN 11  CREATININE 0.85  CALCIUM 8.4*   GFR: Estimated Creatinine Clearance: 69.7 mL/min (by C-G formula based on SCr of 0.85 mg/dL). Liver Function Tests: Recent Labs  Lab 06/24/18 2325  AST 100*  ALT 109*  ALKPHOS 28*  BILITOT 1.1  PROT 7.0    ALBUMIN 2.9*   No results for input(s): LIPASE, AMYLASE in the last 168 hours. No results for input(s): AMMONIA in the last 168 hours. Coagulation Profile: No results for input(s): INR, PROTIME in the last 168 hours. Cardiac Enzymes: Recent Labs  Lab 06/24/18 2325  TROPONINI 1.76*   BNP (last 3 results) No results for input(s): PROBNP in the last 8760 hours. HbA1C: No results for input(s): HGBA1C in the last 72 hours. CBG: No results for input(s): GLUCAP in the last 168 hours. Lipid Profile: No results for input(s): CHOL, HDL, LDLCALC, TRIG, CHOLHDL, LDLDIRECT in the last 72 hours. Thyroid Function Tests: No results for input(s): TSH, T4TOTAL, FREET4, T3FREE, THYROIDAB in the last 72 hours. Anemia Panel: No results for input(s): VITAMINB12, FOLATE, FERRITIN, TIBC, IRON, RETICCTPCT in the last 72 hours. Urine analysis: No results found for: COLORURINE, APPEARANCEUR, LABSPEC, PHURINE, GLUCOSEU, HGBUR, BILIRUBINUR, KETONESUR, PROTEINUR, UROBILINOGEN, NITRITE, LEUKOCYTESUR Sepsis Labs: @LABRCNTIP (procalcitonin:4,lacticidven:4) )No results found for this or any previous visit (from the past 240 hour(s)).   Radiological Exams on Admission: No results found.  EKG: Independently reviewed.  Normal sinus rhythm with anterolateral T wave changes and changes concerning.  Assessment/Plan Active Problems:   Pulmonary embolism (HCC)   Acute CHF (congestive heart failure) (HCC)   NSTEMI (non-ST elevated myocardial infarction) (HCC)   Emphysema lung (HCC)   Non-ST elevated myocardial infarction ([place])    1. Acute bilateral pulmonary embolism -on heparin.  Will check Dopplers of the lower extremity and check 2D echo.  If patient remains hemodynamically stable and no cardiology procedures anticipated then change to oral anticoagulation. 2. Non-ST elevation MI -appreciate cardiology consult.  Cardiology feels patient MI secondary to pulmonary embolism.  On heparin.  Cycle cardiac markers  check 2D echo. 3. Acute CHF -check 2D echo patient received 60 mg IV Lasix in the ER at [facility].  I have added 20 more milligrams.  Further doses based on the response.  Closely follow intake output metabolic panel and daily weights. 4. Abnormal lung lesion seen in the CAT scan concerning for malignancy will need pulmonary consult in the morning. 5. History of scoliosis with low back pain on Percocet and gabapentin. 6. Tobacco abuse advised about quitting.   DVT prophylaxis: Heparin. Code Status: Full code. Family Communication: Discussed with patient. Disposition Plan: Home. Consults called: Cardiology. Admission status: Inpatient.   Rise Patience MD [facility] Pager [phone].  If 7PM-7AM, please contact night-coverage [email or web address] Password [email or web address]  06/25/2018, 4:07 AM

## 2018-06-25 NOTE — Progress Notes (Signed)
Bilateral lower extremity venous duplex has been completed. Negative for DVT.  06/25/18 12:40 PM Dawn Nichols RVT

## 2018-06-25 NOTE — Progress Notes (Addendum)
Same day note/AM admission.  Patient was seen and examined at bedside.   Brief narrative.  Patient with a history of tobacco abuse peripheral neuropathy presented to our hospital with shortness of breath for 3 weeks.  CT angiogram  of the chest done in the ED at [facility] showed bilateral pulmonary embolism and was started on IV heparin.  There was mild elevation troponin and T wave inversion in anterolateral leads and cardiology was consulted.  Patient received a 60 mg of Lasix at the outside hospital and was transferred to our hospital for further evaluation and treatment.  Assessment and plan.  Bilateral pulmonary embolism.  Lower extremity duplex ultrasound has been ordered pending.  Heparin drip.  Hemodynamically stable at this time.  Possible Non non-ST elevation MI.  Elevated troponins on heparin.  Check 2D echocardiogram,  cardiology has seen the patient.  Acute congestive heart failure.  2D echocardiogram.  Patient received 1 time dose of 60 mg of Lasix at the outside hospital.  Continue on IV Lasix.  Repeat a dose now due to crackles.  Abormal CT scan of the chest.  Right lung mass.  Possible malignancy.  History of tobacco abuse. Consult pulmonary for this findings in the context of pulmonary embolism and need for anticoagulation. Spoke with PCCM  Dr Nelda Marseille he recommends continuation of anticoagulation and outpatient follow up for this mass.    Hypokalemia replenished.  Check levels in a.m.  Leukocytosis.  No fever.  Likely reactive.  Check CBC in a.m.  Spoke with multiple family members at bedside and answered questions.   No charge

## 2018-06-25 NOTE — Progress Notes (Addendum)
ANTICOAGULATION CONSULT NOTE   Pharmacy Consult for Heparin  Indication: pulmonary embolus  No Known Allergies  Patient Measurements: Height: 5\' 3"  (160 cm) Weight: 176 lb 4.8 oz (80 kg) IBW/kg (Calculated) : 52.4 Vital Signs: Temp: 98.3 F (36.8 C) (10/15 1116) Temp Source: Oral (10/15 1116) BP: 118/77 (10/15 1116) Pulse Rate: 75 (10/15 1116)  Labs: Recent Labs    06/24/18 2325 06/25/18 0426 06/25/18 1332  HGB 13.8 13.5  --   HCT 43.6 41.1  --   PLT 300 277  --   HEPARINUNFRC  --  <0.10* <0.10*  CREATININE 0.85  --   --   TROPONINI 1.76* 1.83*  --     Estimated Creatinine Clearance: 69.6 mL/min (by C-G formula based on SCr of 0.85 mg/dL).  Assessment: 62 y/o F transfer from outside hospital with heparin running at 1000 units/hr for new onset PE.   Heparin level this afternoon came back undetectable, on 1150 units/hr. Hgb 13.5, plt 277. No s/sx of bleeding. Venous duplex negative for DVT. ECHO showing possible RV apical thrombus. No infusion issues.    Goal of Therapy:  Heparin level 0.3-0.7 units/ml Monitor platelets by anticoagulation protocol: Yes   Plan:  Heparin 2000 units BOLUS Increase heparin drip to 1300 units/hr Obtain heparin level 6 hours after rate increase Monitor daily heparin level, CBC, and for s/sx of bleeding Will monitor for oral anticoagulation plans  Doylene Canard, PharmD Clinical Pharmacist  Pager: [phone] Phone: [phone] 06/25/2018,2:56 PM  ADDENDUM Heparin level remains undetectable despite bolus and rate increase. No infusion issues per nursing. No signs of infiltration. Level drawn appropriately. Plan for cardiac cath on 10/16 in AM so no plans for oral AC prior. Order heparin bolus 2000 units then 1500 units/hr. Obtain heparin level 6 hours after rate increase.   Doylene Canard, PharmD Clinical Pharmacist  Pager: [phone] Phone: [phone]

## 2018-06-25 NOTE — Progress Notes (Signed)
ANTICOAGULATION CONSULT NOTE - Initial Consult  Pharmacy Consult for Heparin  Indication: pulmonary embolus  No Known Allergies  Patient Measurements: Height: 5\' 3"  (160 cm) Weight: 176 lb 12.9 oz (80.2 kg) IBW/kg (Calculated) : 52.4 Vital Signs: Temp: 98.5 F (36.9 C) (10/14 2359) Temp Source: Oral (10/14 2359) BP: 122/79 (10/14 2359) Pulse Rate: 82 (10/14 2359)  Labs: Recent Labs    06/24/18 2325  HGB 13.8  HCT 43.6  PLT 300    CrCl cannot be calculated (Patient's most recent lab result is older than the maximum 21 days allowed.).  Assessment: 62 y/o F transfer from outside hospital with heparin running at 1000 units/hr for new onset PE. CBC here is good.   Goal of Therapy:  Heparin level 0.3-0.7 units/ml Monitor platelets by anticoagulation protocol: Yes   Plan:  -Cont heparin at 1000 units/hr -Heparin level with AM labs at 0500  Narda Bonds 06/25/2018,12:13 AM

## 2018-06-25 NOTE — Progress Notes (Signed)
CRITICAL VALUE ALERT  Critical Value:  Tropin 1.76  Date & Time Notied:  06/25/18 0040  Provider Notified: Chaney Malling  Orders Received/Actions taken: No new orders a t this time.

## 2018-06-25 NOTE — Care Management (Signed)
#    2.   S/W  DENISE  @ [place] # [phone]  1. ELIQUIS  5 MG BID COVER- YES CO-AY- $ 35.00 TIER- NO PRIOR APPROVAL- NO  2. XARELTO 15 MG BID COVER- YES CO-PAY- $ 35.00 TIER- NO PRIOR APPROVAL- NO  PREFERRED PHARMACY :  YES [facility]

## 2018-06-25 NOTE — Progress Notes (Addendum)
Progress Note  Patient Name: Dawn Nichols Date of Encounter: 06/25/2018  Primary Cardiologist:  Elouise Munroe, MD  Subjective   Breathing about the same. No real improvement since admission.  Inpatient Medications    Scheduled Meds: . furosemide  20 mg Intravenous Daily   Continuous Infusions: . heparin 1,150 Units/hr (06/25/18 1258)   PRN Meds: acetaminophen **OR** acetaminophen, gabapentin, ondansetron **OR** ondansetron (ZOFRAN) IV, oxyCODONE-acetaminophen **AND** oxyCODONE   Vital Signs    Vitals:   06/24/18 2218 06/24/18 2359 06/25/18 0446 06/25/18 1116  BP: 113/81 122/79 130/83 118/77  Pulse: 81 82 90 75  Resp: 18 18 20 20   Temp: 98.3 F (36.8 C) 98.5 F (36.9 C) 98.5 F (36.9 C) 98.3 F (36.8 C)  TempSrc: Oral Oral Oral Oral  SpO2: 99% 93% 90% 95%  Weight: 80.2 kg  80 kg   Height: 5\' 3"  (1.6 m)       Intake/Output Summary (Last 24 hours) at 06/25/2018 1505 Last data filed at 06/25/2018 1258 Gross per 24 hour  Intake 494.49 ml  Output 1550 ml  Net -1055.51 ml   Filed Weights   06/24/18 2218 06/25/18 0446  Weight: 80.2 kg 80 kg    Telemetry    SR, no sig ectopy - Personally Reviewed  ECG    10/14, SR, HR 84, PAC seen, inferolateral ST changes, repeat pending - Personally Reviewed  Physical Exam   General: Well developed, well nourished, female appearing in no acute distress. Head: Normocephalic, atraumatic.  Neck: Supple without bruits, JVD mildly elevated Lungs:  Resp regular and unlabored, dense rales bilaterally. Heart: RRR, S1, S2, no S3, S4, or murmur; no rub. Abdomen: Soft, non-tender, non-distended with normoactive bowel sounds. No hepatomegaly. No rebound/guarding. No obvious abdominal masses. Extremities: No clubbing, cyanosis, no edema. Distal pedal pulses are 2+ bilaterally. Neuro: Alert and oriented X 3. Moves all extremities spontaneously. Psych: Normal affect.  Labs    Hematology Recent Labs  Lab 06/24/18 2325  06/25/18 0426  WBC 12.0* 11.9*  RBC 4.96 4.69  HGB 13.8 13.5  HCT 43.6 41.1  MCV 87.9 87.6  MCH 27.8 28.8  MCHC 31.7 32.8  RDW 13.5 13.5  PLT 300 277    Chemistry Recent Labs  Lab 06/24/18 2325  NA 142  K 3.2*  CL 100  CO2 32  GLUCOSE 107*  BUN 11  CREATININE 0.85  CALCIUM 8.4*  PROT 7.0  ALBUMIN 2.9*  AST 100*  ALT 109*  ALKPHOS 28*  BILITOT 1.1  GFRNONAA >60  GFRAA >60  ANIONGAP 10     Cardiac Enzymes Recent Labs  Lab 06/24/18 2325 06/25/18 0426  TROPONINI 1.76* 1.83*   No results for input(s): TROPIPOC in the last 168 hours.   BNP No results found for: BNP  ProBNP No results found for: PROBNP   Radiology    Images from CT Chest angio not available for review of PE or coronary calcifications.  Cardiac Studies   ECHO:  06/24/2018 - Left ventricle: The cavity size was normal. Systolic function was normal. The estimated ejection fraction was in the range of 55% to 60%. Possible hypokinesis of the basal-midinferior and inferoseptal myocardium; consistent with ischemia or infarction in the distribution of the right coronary artery. - Ventricular septum: The contour showed systolic flattening. These changes are consistent with RV pressure overload. - Right ventricle: Akinesis of the RV apex, consistent with   ischemia or infarction. There was a possible, medium-sized, 2.8 cm (L) x 1.3 cm (  W), protruding, fixed thrombus at the apex, associated with an akinetic segment; based on its appearance, thrombus cannot be excluded.  Impressions:  - Right ventricular function is depressed due to regional apical   dysfunction, with preserved annular excursion. This is the   opposite of the expected pattern seen in acute pulmonary   embolism. Consider primary right ventricular infarction. Possible RV apical thrombus, which could be the source of pulmonary embolism, especially if there is no evidence for lower extremity DVT.   Patient Profile     62 y.o.  female w/ hx tob use was admitted from [facility] w/ elevated pro-BNP, elevated trop, CT +small bilateral PE, severe emphysema, ?RLL mass, ?infection/inflammation R lung. Had Lasix w/ good UOP, on hep gtt.   Assessment & Plan     Active Problems: 1.  Pulmonary embolism ([place]) -Lower extremity Dopplers have been done, preliminary result is negative for DVT -See echo report, concern for RV thrombus  2.  Acute CHF (congestive heart failure) (HCC) - Acute diastolic CHF with RV dysfunction -Unclear if she should be diuresed further as she may be preload dependent  3.  NSTEMI (non-ST elevated myocardial infarction) (HCC) - Elevation in cardiac enzymes and ECG is abnormal. - Echocardiogram is also abnormal with RV wall motion abnormalities concerning for RCA infarct - She has not been n.p.o. But may be able to cath later today - add ASA, high-dose statin - with decreased RV function, not sure about BB  4.  Emphysema lung (HCC) - per IM  5. Possible lung CA: - Pt being evaluated by Pulm team, prelim plan is for repeat CT and decide on further eval as oupt.   SignedRosaria Ferries , PA-C 3:05 PM 06/25/2018 Pager: [phone] ---------------------------------------------------------------------------------------------------   History and all data above reviewed.  Patient examined.  I agree with the findings as above.  Dawn Nichols presents with shortness of breath, with findings of small bilateral PE and evidence of right ventricular apical akinesis with a linear echodensity that is mobile at the right ventricular apex, concerning for thrombus.  She has elevated biomarkers and a worsening ECG, concerning for right ventricular infarction.  She continues to be short of breath today, using supplemental oxygen, but otherwise is without current chest pain.  Constitutional: No acute distress Eyes: pupils equally round and reactive to light, sclera non-icteric, normal conjunctiva and  lids ENMT: moist mucous membranes Cardiovascular: regular rhythm, normal rate, no murmurs. S1 and S2 normal. Radial pulses normal bilaterally. No jugular venous distention.  Respiratory: clear to auscultation bilaterally GI : normal bowel sounds, soft and nontender. No distention.   MSK: extremities warm, well perfused. No edema.  NEURO: grossly nonfocal exam, moves all extremities. PSYCH: alert and oriented x 3, normal mood and affect.   All available labs, radiology testing, previous records reviewed. Agree with documented assessment and plan of my colleague as stated above with the following additions or changes:  Active Problems:   Pulmonary embolism (HCC)   Acute CHF (congestive heart failure) (HCC)   NSTEMI (non-ST elevated myocardial infarction) ([place])   Emphysema lung (HCC)   Non-ST elevated myocardial infarction [facility])   I have independently reviewed the echocardiogram and all relevant ECGs.  Plan: -She is elevated biomarkers and worsening T wave inversions on ECG.  Her troponin has peaked at 1.83, and is downtrending.  With these features of N STEMI, and echo findings suggesting RV apical akinesis with possible thrombus, we will arrange for her to be n.p.o. at midnight  for coronary angiography with possible PCI.  She will need formal cath consent in the morning.    -RV apical thrombus: Continue on heparin anticoagulation. Further [facility] decisions can be made post cath.  - Need to obtain CT scan from outside hospital for burden of PE, and characterization of RV thrombus if possible with a study that has already been completed.  - Possible diastolic HF - will hold diuresis currently due to RV infarct.   Cardiology will follow along - npo and cath tomorrow.  Elouise Munroe, MD [facility] 5:18 PM  06/25/2018

## 2018-06-25 NOTE — Plan of Care (Signed)
  Problem: Education: Goal: Knowledge of General Education information will improve Description Including pain rating scale, medication(s)/side effects and non-pharmacologic comfort measures Outcome: Progressing   Problem: Health Behavior/Discharge Planning: Goal: Ability to manage health-related needs will improve Outcome: Progressing   Problem: Clinical Measurements: Goal: Respiratory complications will improve Outcome: Progressing   Problem: Activity: Goal: Risk for activity intolerance will decrease Outcome: Progressing   Problem: Activity: Goal: Capacity to carry out activities will improve Outcome: Progressing   

## 2018-06-25 NOTE — Progress Notes (Signed)
  Echocardiogram 2D Echocardiogram has been performed.  Dawn Nichols 06/25/2018, 12:23 PM

## 2018-06-25 NOTE — Consult Note (Addendum)
NAME:  Dawn Nichols, MRN:  [id number], DOB:  [birth year], LOS: 1 ADMISSION DATE:  06/24/2018, CONSULTATION DATE:  06/25/2018 REFERRING MD:  Dr. Louanne Belton, CHIEF COMPLAINT:  PE/ lung mass   Brief History   62 yoF w/ 3 week history of progressive SOB found at OSH to have small bilateral PE's, and RLL mass.  Additionally found to have elevated troponin and BNP with EKG changes.  Cardiology evaluating for possible NSTEMI.    Past Medical History  Tobacco abuse, peripheral neuropathy, scoliosis  Significant Hospital Events   10/14 Transferred to [facility] 10/15 Pulm consult  Consults: date of consult/date signed off & final recs:  10/15 Cards 10/15 PCCM  Procedures (surgical and bedside):   Significant Diagnostic Tests:  10/14 CTA PE >> Small bilateral PE; severe emphysema w/ extensive septal thickening and concern for new parenchymal disease in the right lung which could represent an infectious or inflammatory process.  Emphysema.  Mediastinal and hilar lymphadenopathy.  Concern for RLL or right hilar lesion measuring 2.4 cm.   10/15 TTE>>  10/15 Bilateral venous duplex >> neg for DVT  Micro Data:  none  Antimicrobials:  none  Subjective:    Objective   Blood pressure 118/77, pulse 75, temperature 98.3 F (36.8 C), temperature source Oral, resp. rate 20, height 5\' 3"  (1.6 m), weight 80 kg, SpO2 95 %.        Intake/Output Summary (Last 24 hours) at 06/25/2018 1141 Last data filed at 06/25/2018 4098 Gross per 24 hour  Intake 410.73 ml  Output 1550 ml  Net -1139.27 ml   Filed Weights   06/24/18 2218 06/25/18 0446  Weight: 80.2 kg 80 kg    Examination: General:  Adult female sitting up in bed in NAD HEENT: MM pink/moist, no obvious JVD Neuro: alert, oriented, non focal CV:  rrr, no mumur PULM: even/non-labored, lungs bilaterally clear anteriorly, slight prolonged wheeze, posterior rales  GI: obese, soft, non-tender, bs active  Extremities: warm/dry, +2 BLE pitting edema,  no erythema/ warmth/ tenderness Skin: no rashes   Resolved Hospital Problem list    Assessment & Plan:  Abnormal chest CT findings of RLL vs right hilar mass 2.4 cm w/ mediastinal and hilar lymphadenopathy and extensive septal thickening/ fibrotic changes Ongoing tobacco abuse  -  New changes when compared to lung windows on 9/18 thoracic CT -  Can not rule out malignancy at this point given her extensive smoking hx, but ddx include infectious vs inflammatory process +/- edema - not a candidate for inpatient bronch given acute PE P:  - Assess PCT, if positive given mild leukocytosis, will start abx - will check RVP, HSP, and autoimmune panel - diuresis per primary team - no role for steroids at this point - tobacco cessation counseling - Outpatient pulmonary follow-up with November 7 at 215 pm with Wyn Quaker, NP With plans on repeat imaging in 6-8 weeks,  PFTs   Small Bilateral PE with small clot burden - unprovoked  - hemodynamically stable P:  Heparin gtt Pending TTE Bilateral LE venous duplex neg for DVT Wean O2 as needed for sats 90-96%  Elevated troponin's  - r/o NSTEMI given anterolateral EKG changes vs related to right heart strain from PE less likely given small clot burden - 1.76 -> 1.83 P:  Cards following TTE pending  Disposition / Summary of Today's Plan 06/25/18   Pending PCT, RVP, HSP, and autoimmune panel.  PCCM will continue to follow.      Labs  CBC: Recent Labs  Lab 06/24/18 2325 06/25/18 0426  WBC 12.0* 11.9*  NEUTROABS 8.6*  --   HGB 13.8 13.5  HCT 43.6 41.1  MCV 87.9 87.6  PLT 300 371    Basic Metabolic Panel: Recent Labs  Lab 06/24/18 2325 06/25/18 0426  NA 142  --   K 3.2*  --   CL 100  --   CO2 32  --   GLUCOSE 107*  --   BUN 11  --   CREATININE 0.85  --   CALCIUM 8.4*  --   MG  --  1.9   GFR: Estimated Creatinine Clearance: 69.6 mL/min (by C-G formula based on SCr of 0.85 mg/dL). Recent Labs  Lab 06/24/18 2325  06/25/18 0426  WBC 12.0* 11.9*    Liver Function Tests: Recent Labs  Lab 06/24/18 2325  AST 100*  ALT 109*  ALKPHOS 28*  BILITOT 1.1  PROT 7.0  ALBUMIN 2.9*   No results for input(s): LIPASE, AMYLASE in the last 168 hours. No results for input(s): AMMONIA in the last 168 hours.  ABG No results found for: PHART, PCO2ART, PO2ART, HCO3, TCO2, ACIDBASEDEF, O2SAT   Coagulation Profile: No results for input(s): INR, PROTIME in the last 168 hours.  Cardiac Enzymes: Recent Labs  Lab 06/24/18 2325 06/25/18 0426  TROPONINI 1.76* 1.83*    HbA1C: No results found for: HGBA1C  CBG: No results for input(s): GLUCAP in the last 168 hours.  Admitting History of Present Illness.   62 yoF with past medical history of tobacco abuse (1.5 ppd since she was 17), scoliosis and chronic back pain.  She was transferred from [facility] on 10/14 with shortness of breath.   Patient lives at home with her husband.  States she is mostly sedentary.  Quit working in 2013, worked in a [employer].  Denies any known exposures, no mold, or birds.  Denies weight loss or hemoptysis.  Denies family hx of blood clots, lung disease, or autoimmune processes. No prior pulmonary evaluation and doesn't use inhalers.  States has a chronic clear productive cough.  She has been unable to sleep flat for a long time given her scoliosis. She does not see a primary physician on a regular basis, last evaluation 3 years ago.  Symptoms started around Sept 23 when they went to [facility], she reports cold symptoms of nausea and subjective fever.  She felt like she got better but over the last two weeks has had exertional dyspnea.  Additionally she has noticed swelling in both lower legs for "some time".    She presented to [facility] with shortness of breath and felt dizzy after coughing episodes but denies syncope.  She was hypoxic and required supplemental O2.  CTA showed small bilateral PE's, fibrotic changes, and RLL  mass.  Additionally found to have slight leukocytosis, elevated troponin and BNP, and new anterolateral twave inversions. Diuresis started and since patient does not feel like her SOB has improved.  At [facility], she has been afebrile, hemodynamically stable with minimal O2 requirements.  Cardiology following to rule out cardiac event.  Pulmonary consulted given lung mass and PE.   Review of Systems:   POSITIVES IN BOLD Gen: Denies fever, chills, weight change, fatigue, night sweats HEENT: Denies sinus congestion, rhinorrhea, sore throat, dysphagia PULM: Denies shortness of breath, clear productive cough, sputum production, hemoptysis, wheezing CV: Denies chest pain, edema, orthopnea, paroxysmal nocturnal dyspnea, palpitations GI: Denies abdominal pain, nausea, vomiting, diarrhea, change in bowel habits Endocrine:  Denies appetite change Derm: Denies rash, skin change Heme: Denies easy bruising, bleeding,  Neuro: Denies headache, weakness, slurred speech, loss of memory or consciousness  Past Medical History  She,  has no past medical history on file.   Surgical History    Past Surgical History:  Procedure Laterality Date  . CESAREAN SECTION     Two  . SPINE SURGERY       Social History   Social History   Socioeconomic History  . Marital status: Married    Spouse name: Not on file  . Number of children: Not on file  . Years of education: Not on file  . Highest education level: Not on file  Occupational History  . Not on file  Social Needs  . Financial resource strain: Not on file  . Food insecurity:    Worry: Not on file    Inability: Not on file  . Transportation needs:    Medical: Not on file    Non-medical: Not on file  Tobacco Use  . Smoking status: Current Every Day Smoker    Packs/day: 1.00    Years: 38.00    Pack years: 38.00    Types: Cigarettes  . Smokeless tobacco: Never Used  Substance and Sexual Activity  . Alcohol use: Not Currently  . Drug use: Not on  file  . Sexual activity: Not on file  Lifestyle  . Physical activity:    Days per week: Not on file    Minutes per session: Not on file  . Stress: Not on file  Relationships  . Social connections:    Talks on phone: Not on file    Gets together: Not on file    Attends religious service: Not on file    Active member of club or organization: Not on file    Attends meetings of clubs or organizations: Not on file    Relationship status: Not on file  . Intimate partner violence:    Fear of current or ex partner: Not on file    Emotionally abused: Not on file    Physically abused: Not on file    Forced sexual activity: Not on file  Other Topics Concern  . Not on file  Social History Narrative  . Not on file  ,  reports that she has been smoking cigarettes. She has a 38.00 pack-year smoking history. She has never used smokeless tobacco. She reports that she drank alcohol.   Family History   Her family history includes Cancer in her father and mother.   Allergies No Known Allergies   Home Medications  Prior to Admission medications   Medication Sig Start Date End Date Taking? Authorizing Provider  gabapentin (NEURONTIN) 600 MG tablet Take 600 mg by mouth 4 (four) times daily.  12/07/13  Yes [provider]  oxyCODONE-acetaminophen (PERCOCET) 10-325 MG per tablet Take 1 tablet by mouth 3 (three) times daily as needed for pain. PRN up to three times daily 12/12/13  Yes [provider]  amoxicillin (AMOXIL) 500 MG capsule  12/23/13   [provider]  meloxicam (MOBIC) 15 MG tablet Take 15 mg by mouth daily. 12/29/13   [provider]         Kennieth Rad, AGACNP-BC [place] Pgr: [phone] or if no answer [phone] 06/25/2018, 2:55 PM  Attending Note:  61 year old female long term smoker presenting with SOB and was noted to have a PE and a lung mass as  well as diffuse GGO and emphysema.  On exam, bibasilar crackles.  I  reviewed chest CT myself, small PE clot burden noted with GGO and a RLL lung mass noted.  Patient is currently on heparin.  Discussed with [facility]-MD and PCCM-NP.  PE:  - Anticoagulation with heparin  - Will need to determine bridge, given need for bronch eventually would recommend NOAC  Hypoxemia:  - Titrate O2 for sat of 88-92%  - Will need an ambulatory desat study prior to discharge for ?home O2  GGO: ?autoimmune vs infection vs pulmonary edema in a patient with emphysema specially with BNP and trop  - RVP  - Procalcitonin  - No steroids for now  - Autoimmune work up initiated  Lung mass:  - No bronch for now  - Treat PE for 6-8 weeks then f/u with pulmonary as outpatient with a repeat non-contrast CT  PCCM will follow.  Patient seen and examined, agree with above note.  I dictated the care and orders written for this patient under my direction.  Rush Farmer, [place]

## 2018-06-25 NOTE — H&P (View-Only) (Signed)
Progress Note  Patient Name: Dawn Nichols Date of Encounter: 06/25/2018  Primary Cardiologist:  Elouise Munroe, MD  Subjective   Breathing about the same. No real improvement since admission.  Inpatient Medications    Scheduled Meds: . furosemide  20 mg Intravenous Daily   Continuous Infusions: . heparin 1,150 Units/hr (06/25/18 1258)   PRN Meds: acetaminophen **OR** acetaminophen, gabapentin, ondansetron **OR** ondansetron (ZOFRAN) IV, oxyCODONE-acetaminophen **AND** oxyCODONE   Vital Signs    Vitals:   06/24/18 2218 06/24/18 2359 06/25/18 0446 06/25/18 1116  BP: 113/81 122/79 130/83 118/77  Pulse: 81 82 90 75  Resp: 18 18 20 20   Temp: 98.3 F (36.8 C) 98.5 F (36.9 C) 98.5 F (36.9 C) 98.3 F (36.8 C)  TempSrc: Oral Oral Oral Oral  SpO2: 99% 93% 90% 95%  Weight: 80.2 kg  80 kg   Height: 5\' 3"  (1.6 m)       Intake/Output Summary (Last 24 hours) at 06/25/2018 1505 Last data filed at 06/25/2018 1258 Gross per 24 hour  Intake 494.49 ml  Output 1550 ml  Net -1055.51 ml   Filed Weights   06/24/18 2218 06/25/18 0446  Weight: 80.2 kg 80 kg    Telemetry    SR, no sig ectopy - Personally Reviewed  ECG    10/14, SR, HR 84, PAC seen, inferolateral ST changes, repeat pending - Personally Reviewed  Physical Exam   General: Well developed, well nourished, female appearing in no acute distress. Head: Normocephalic, atraumatic.  Neck: Supple without bruits, JVD mildly elevated Lungs:  Resp regular and unlabored, dense rales bilaterally. Heart: RRR, S1, S2, no S3, S4, or murmur; no rub. Abdomen: Soft, non-tender, non-distended with normoactive bowel sounds. No hepatomegaly. No rebound/guarding. No obvious abdominal masses. Extremities: No clubbing, cyanosis, no edema. Distal pedal pulses are 2+ bilaterally. Neuro: Alert and oriented X 3. Moves all extremities spontaneously. Psych: Normal affect.  Labs    Hematology Recent Labs  Lab 06/24/18 2325  06/25/18 0426  WBC 12.0* 11.9*  RBC 4.96 4.69  HGB 13.8 13.5  HCT 43.6 41.1  MCV 87.9 87.6  MCH 27.8 28.8  MCHC 31.7 32.8  RDW 13.5 13.5  PLT 300 277    Chemistry Recent Labs  Lab 06/24/18 2325  NA 142  K 3.2*  CL 100  CO2 32  GLUCOSE 107*  BUN 11  CREATININE 0.85  CALCIUM 8.4*  PROT 7.0  ALBUMIN 2.9*  AST 100*  ALT 109*  ALKPHOS 28*  BILITOT 1.1  GFRNONAA >60  GFRAA >60  ANIONGAP 10     Cardiac Enzymes Recent Labs  Lab 06/24/18 2325 06/25/18 0426  TROPONINI 1.76* 1.83*   No results for input(s): TROPIPOC in the last 168 hours.   BNP No results found for: BNP  ProBNP No results found for: PROBNP   Radiology    Images from CT Chest angio not available for review of PE or coronary calcifications.  Cardiac Studies   ECHO:  06/24/2018 - Left ventricle: The cavity size was normal. Systolic function was normal. The estimated ejection fraction was in the range of 55% to 60%. Possible hypokinesis of the basal-midinferior and inferoseptal myocardium; consistent with ischemia or infarction in the distribution of the right coronary artery. - Ventricular septum: The contour showed systolic flattening. These changes are consistent with RV pressure overload. - Right ventricle: Akinesis of the RV apex, consistent with   ischemia or infarction. There was a possible, medium-sized, 2.8 cm (L) x 1.3 cm (  W), protruding, fixed thrombus at the apex, associated with an akinetic segment; based on its appearance, thrombus cannot be excluded.  Impressions:  - Right ventricular function is depressed due to regional apical   dysfunction, with preserved annular excursion. This is the   opposite of the expected pattern seen in acute pulmonary   embolism. Consider primary right ventricular infarction. Possible RV apical thrombus, which could be the source of pulmonary embolism, especially if there is no evidence for lower extremity DVT.   Patient Profile     62 y.o.  female w/ hx tob use was admitted from [facility] w/ elevated pro-BNP, elevated trop, CT +small bilateral PE, severe emphysema, ?RLL mass, ?infection/inflammation R lung. Had Lasix w/ good UOP, on hep gtt.   Assessment & Plan     Active Problems: 1.  Pulmonary embolism ([place]) -Lower extremity Dopplers have been done, preliminary result is negative for DVT -See echo report, concern for RV thrombus  2.  Acute CHF (congestive heart failure) (HCC) - Acute diastolic CHF with RV dysfunction -Unclear if she should be diuresed further as she may be preload dependent  3.  NSTEMI (non-ST elevated myocardial infarction) (HCC) - Elevation in cardiac enzymes and ECG is abnormal. - Echocardiogram is also abnormal with RV wall motion abnormalities concerning for RCA infarct - She has not been n.p.o. But may be able to cath later today - add ASA, high-dose statin - with decreased RV function, not sure about BB  4.  Emphysema lung (HCC) - per IM  5. Possible lung CA: - Pt being evaluated by Pulm team, prelim plan is for repeat CT and decide on further eval as oupt.   SignedRosaria Ferries , PA-C 3:05 PM 06/25/2018 Pager: [phone] ---------------------------------------------------------------------------------------------------   History and all data above reviewed.  Patient examined.  I agree with the findings as above.  Dawn Nichols presents with shortness of breath, with findings of small bilateral PE and evidence of right ventricular apical akinesis with a linear echodensity that is mobile at the right ventricular apex, concerning for thrombus.  She has elevated biomarkers and a worsening ECG, concerning for right ventricular infarction.  She continues to be short of breath today, using supplemental oxygen, but otherwise is without current chest pain.  Constitutional: No acute distress Eyes: pupils equally round and reactive to light, sclera non-icteric, normal conjunctiva and  lids ENMT: moist mucous membranes Cardiovascular: regular rhythm, normal rate, no murmurs. S1 and S2 normal. Radial pulses normal bilaterally. No jugular venous distention.  Respiratory: clear to auscultation bilaterally GI : normal bowel sounds, soft and nontender. No distention.   MSK: extremities warm, well perfused. No edema.  NEURO: grossly nonfocal exam, moves all extremities. PSYCH: alert and oriented x 3, normal mood and affect.   All available labs, radiology testing, previous records reviewed. Agree with documented assessment and plan of my colleague as stated above with the following additions or changes:  Active Problems:   Pulmonary embolism (HCC)   Acute CHF (congestive heart failure) (HCC)   NSTEMI (non-ST elevated myocardial infarction) ([place])   Emphysema lung (HCC)   Non-ST elevated myocardial infarction [facility])   I have independently reviewed the echocardiogram and all relevant ECGs.  Plan: -She is elevated biomarkers and worsening T wave inversions on ECG.  Her troponin has peaked at 1.83, and is downtrending.  With these features of N STEMI, and echo findings suggesting RV apical akinesis with possible thrombus, we will arrange for her to be n.p.o. at midnight  for coronary angiography with possible PCI.  She will need formal cath consent in the morning.    -RV apical thrombus: Continue on heparin anticoagulation. Further [facility] decisions can be made post cath.  - Need to obtain CT scan from outside hospital for burden of PE, and characterization of RV thrombus if possible with a study that has already been completed.  - Possible diastolic HF - will hold diuresis currently due to RV infarct.   Cardiology will follow along - npo and cath tomorrow.  Elouise Munroe, MD [facility] 5:18 PM  06/25/2018

## 2018-06-25 NOTE — Progress Notes (Signed)
ANTICOAGULATION CONSULT NOTE   Pharmacy Consult for Heparin  Indication: pulmonary embolus  No Known Allergies  Patient Measurements: Height: 5\' 3"  (160 cm) Weight: 176 lb 4.8 oz (80 kg) IBW/kg (Calculated) : 52.4 Vital Signs: Temp: 98.5 F (36.9 C) (10/15 0446) Temp Source: Oral (10/15 0446) BP: 130/83 (10/15 0446) Pulse Rate: 90 (10/15 0446)  Labs: Recent Labs    06/24/18 2325 06/25/18 0426  HGB 13.8 13.5  HCT 43.6 41.1  PLT 300 277  HEPARINUNFRC  --  <0.10*  CREATININE 0.85  --   TROPONINI 1.76*  --     Estimated Creatinine Clearance: 69.6 mL/min (by C-G formula based on SCr of 0.85 mg/dL).  Assessment: 62 y/o F transfer from outside hospital with heparin running at 1000 units/hr for new onset PE. CBC here is good.   10/15 AM update: heparin level low, no issues per RN.   Goal of Therapy:  Heparin level 0.3-0.7 units/ml Monitor platelets by anticoagulation protocol: Yes   Plan:  Heparin 2000 units [employer] heparin drip to 1150 units/hr 1300 HL  Cartel Mauss 06/25/2018,5:25 AM

## 2018-06-26 ENCOUNTER — Encounter (HOSPITAL_COMMUNITY)
Admission: AD | Disposition: A | Discharge status: Home / Self Care | Payer: Self-pay | Source: Other Acute Inpatient Hospital | Attending: Internal Medicine

## 2018-06-26 DIAGNOSIS — I240 Acute coronary thrombosis not resulting in myocardial infarction: Secondary | ICD-10-CM

## 2018-06-26 DIAGNOSIS — R918 Other nonspecific abnormal finding of lung field: Secondary | ICD-10-CM

## 2018-06-26 DIAGNOSIS — R0902 Hypoxemia: Secondary | ICD-10-CM

## 2018-06-26 DIAGNOSIS — I513 Intracardiac thrombosis, not elsewhere classified: Secondary | ICD-10-CM

## 2018-06-26 HISTORY — PX: LEFT HEART CATH AND CORONARY ANGIOGRAPHY: CATH118249

## 2018-06-26 LAB — BASIC METABOLIC PANEL
Anion gap: 10 (ref 5–15)
BUN: 11 mg/dL (ref 8–23)
CALCIUM: 8.3 mg/dL — AB (ref 8.9–10.3)
CO2: 30 mmol/L (ref 22–32)
CREATININE: 0.73 mg/dL (ref 0.44–1.00)
Chloride: 100 mmol/L (ref 98–111)
GFR calc non Af Amer: >60 mL/min (ref >60)
GLUCOSE: 110 mg/dL — AB (ref 70–99)
Potassium: 3.5 mmol/L (ref 3.5–5.1)
Sodium: 140 mmol/L (ref 135–145)

## 2018-06-26 LAB — CBC
HEMATOCRIT: 44.3 % (ref 36.0–46.0)
Hemoglobin: 13.4 g/dL (ref 12.0–15.0)
MCH: 27.3 pg (ref 26.0–34.0)
MCHC: 30.2 g/dL (ref 30.0–36.0)
MCV: 90.4 fL (ref 80.0–100.0)
Platelets: 275 10*3/uL (ref 150–400)
RBC: 4.9 MIL/uL (ref 3.87–5.11)
RDW: 13.7 % (ref 11.5–15.5)
WBC: 8.7 10*3/uL (ref 4.0–10.5)
nRBC: 0 % (ref 0.0–0.2)

## 2018-06-26 LAB — HEPATITIS PANEL, ACUTE
HEP A IGM: NEGATIVE
HEP B C IGM: NEGATIVE
Hepatitis B Surface Ag: NEGATIVE

## 2018-06-26 LAB — MAGNESIUM: Magnesium: 2.2 mg/dL (ref 1.7–2.4)

## 2018-06-26 LAB — ANGIOTENSIN CONVERTING ENZYME: ANGIOTENSIN-CONVERTING ENZYME: 28 U/L (ref 14–82)

## 2018-06-26 LAB — HEPARIN LEVEL (UNFRACTIONATED)
Heparin Unfractionated: 0.14 IU/mL — ABNORMAL LOW (ref 0.30–0.70)
Heparin Unfractionated: 0.3 IU/mL (ref 0.30–0.70)

## 2018-06-26 LAB — ANTI-DNA ANTIBODY, DOUBLE-STRANDED: ds DNA Ab: 2 IU/mL (ref 0–9)

## 2018-06-26 LAB — ANTINUCLEAR ANTIBODIES, IFA: ANTINUCLEAR ANTIBODIES, IFA: NEGATIVE

## 2018-06-26 LAB — RHEUMATOID FACTOR: Rhuematoid fact SerPl-aCnc: <10 IU/mL (ref 0.0–13.9)

## 2018-06-26 SURGERY — LEFT HEART CATH AND CORONARY ANGIOGRAPHY
Anesthesia: LOCAL

## 2018-06-26 MED ORDER — ASPIRIN 81 MG PO CHEW: 81.0000 mg | CHEWABLE_TABLET | ORAL | Status: DC

## 2018-06-26 MED ORDER — SODIUM CHLORIDE 0.9 % IV SOLN
INTRAVENOUS | Status: DC
  Administered 2018-06-26: 15:00:00 via INTRAVENOUS

## 2018-06-26 MED ORDER — HEPARIN (PORCINE) IN NACL 1000-0.9 UT/500ML-% IV SOLN
INTRAVENOUS | Status: AC
  Filled 2018-06-26: qty 1000

## 2018-06-26 MED ORDER — MIDAZOLAM HCL 2 MG/2ML IJ SOLN
INTRAMUSCULAR | Status: DC | PRN
  Administered 2018-06-26: 1 mg via INTRAVENOUS

## 2018-06-26 MED ORDER — LIDOCAINE HCL (PF) 1 % IJ SOLN
INTRAMUSCULAR | Status: DC | PRN
  Administered 2018-06-26: 2 mL

## 2018-06-26 MED ORDER — SODIUM CHLORIDE 0.9% FLUSH: 3.0000 mL | INTRAVENOUS | Status: DC | PRN

## 2018-06-26 MED ORDER — SODIUM CHLORIDE 0.9% FLUSH
3.0000 mL | Freq: Two times a day (BID) | INTRAVENOUS | Status: DC
  Administered 2018-06-26 – 2018-06-28 (×3): 3 mL via INTRAVENOUS

## 2018-06-26 MED ORDER — SODIUM CHLORIDE 0.9 % IV SOLN
INTRAVENOUS | Status: DC
  Administered 2018-06-26 (×2): via INTRAVENOUS

## 2018-06-26 MED ORDER — HEPARIN (PORCINE) IN NACL 1000-0.9 UT/500ML-% IV SOLN
INTRAVENOUS | Status: DC | PRN
  Administered 2018-06-26: 500 mL

## 2018-06-26 MED ORDER — HEPARIN SODIUM (PORCINE) 1000 UNIT/ML IJ SOLN
INTRAMUSCULAR | Status: DC | PRN
  Administered 2018-06-26: 4000 [IU] via INTRAVENOUS

## 2018-06-26 MED ORDER — LIDOCAINE HCL (PF) 1 % IJ SOLN
INTRAMUSCULAR | Status: AC
  Filled 2018-06-26: qty 30

## 2018-06-26 MED ORDER — HEPARIN BOLUS VIA INFUSION
2000.0000 [IU] | Freq: Once | INTRAVENOUS | Status: AC
  Administered 2018-06-26: 2000 [IU] via INTRAVENOUS
  Filled 2018-06-26: qty 2000

## 2018-06-26 MED ORDER — HEPARIN (PORCINE) IN NACL 100-0.45 UNIT/ML-% IJ SOLN
2100.0000 [IU]/h | INTRAMUSCULAR | Status: DC
  Administered 2018-06-26 – 2018-06-27 (×2): 1800 [IU]/h via INTRAVENOUS
  Filled 2018-06-26: qty 250

## 2018-06-26 MED ORDER — SODIUM CHLORIDE 0.9% FLUSH: 3.0000 mL | Freq: Two times a day (BID) | INTRAVENOUS | Status: DC

## 2018-06-26 MED ORDER — ASPIRIN 81 MG PO CHEW
81.0000 mg | CHEWABLE_TABLET | Freq: Every day | ORAL | Status: DC
  Administered 2018-06-27 – 2018-06-28 (×2): 81 mg via ORAL
  Filled 2018-06-26 (×2): qty 1

## 2018-06-26 MED ORDER — VERAPAMIL HCL 2.5 MG/ML IV SOLN
INTRAVENOUS | Status: AC
  Filled 2018-06-26: qty 2

## 2018-06-26 MED ORDER — DIAZEPAM 5 MG PO TABS
5.0000 mg | ORAL_TABLET | Freq: Four times a day (QID) | ORAL | Status: DC | PRN
  Administered 2018-06-26 – 2018-06-27 (×3): 5 mg via ORAL
  Filled 2018-06-26 (×3): qty 1

## 2018-06-26 MED ORDER — SODIUM CHLORIDE 0.9 % IV SOLN: 250.0000 mL | INTRAVENOUS | Status: DC | PRN

## 2018-06-26 MED ORDER — FENTANYL CITRATE (PF) 100 MCG/2ML IJ SOLN
INTRAMUSCULAR | Status: DC | PRN
  Administered 2018-06-26: 25 ug via INTRAVENOUS

## 2018-06-26 MED ORDER — IOHEXOL 350 MG/ML SOLN
INTRAVENOUS | Status: DC | PRN
  Administered 2018-06-26: 60 mL via INTRA_ARTERIAL

## 2018-06-26 MED ORDER — MIDAZOLAM HCL 2 MG/2ML IJ SOLN
INTRAMUSCULAR | Status: AC
  Filled 2018-06-26: qty 2

## 2018-06-26 MED ORDER — ONDANSETRON HCL 4 MG/2ML IJ SOLN: 4.0000 mg | Freq: Four times a day (QID) | INTRAMUSCULAR | Status: DC | PRN

## 2018-06-26 MED ORDER — ACETAMINOPHEN 325 MG PO TABS: 650.0000 mg | ORAL_TABLET | ORAL | Status: DC | PRN

## 2018-06-26 MED ORDER — FENTANYL CITRATE (PF) 100 MCG/2ML IJ SOLN
INTRAMUSCULAR | Status: AC
  Filled 2018-06-26: qty 2

## 2018-06-26 MED ORDER — VERAPAMIL HCL 2.5 MG/ML IV SOLN
INTRAVENOUS | Status: DC | PRN
  Administered 2018-06-26: 17:00:00 via INTRA_ARTERIAL

## 2018-06-26 SURGICAL SUPPLY — 9 items
CATH OPTITORQUE TIG 4.0 5F (CATHETERS) ×2 IMPLANT
DEVICE RAD COMP TR BAND LRG (VASCULAR PRODUCTS) ×2 IMPLANT
GLIDESHEATH SLEND SS 6F .021 (SHEATH) ×2 IMPLANT
GUIDEWIRE INQWIRE 1.5J.035X260 (WIRE) ×1 IMPLANT
INQWIRE 1.5J .035X260CM (WIRE) ×2
KIT HEART LEFT (KITS) ×2 IMPLANT
PACK CARDIAC CATHETERIZATION (CUSTOM PROCEDURE TRAY) ×2 IMPLANT
TRANSDUCER W/STOPCOCK (MISCELLANEOUS) ×2 IMPLANT
TUBING CIL FLEX 10 FLL-RA (TUBING) ×2 IMPLANT

## 2018-06-26 NOTE — Plan of Care (Signed)
  Problem: Activity: Goal: Risk for activity intolerance will decrease Outcome: Progressing   Problem: Safety: Goal: Ability to remain free from injury will improve Outcome: Progressing   Problem: Activity: Goal: Capacity to carry out activities will improve Outcome: Progressing   

## 2018-06-26 NOTE — Interval H&P Note (Signed)
Cath Lab Visit (complete for each Cath Lab visit)  Clinical Evaluation Leading to the Procedure:   ACS: No.  Non-ACS:    Anginal Classification: CCS II  Anti-ischemic medical therapy: No Therapy  Non-Invasive Test Results: No non-invasive testing performed  Prior CABG: No previous CABG      History and Physical Interval Note:  06/26/2018 4:25 PM  Dawn Nichols  has presented today for surgery, with the diagnosis of cp  The various methods of treatment have been discussed with the patient and family. After consideration of risks, benefits and other options for treatment, the patient has consented to  Procedure(s): LEFT HEART CATH AND CORONARY ANGIOGRAPHY (N/A) as a surgical intervention .  The patient's history has been reviewed, patient examined, no change in status, stable for surgery.  I have reviewed the patient's chart and labs.  Questions were answered to the patient's satisfaction.     Shelva Majestic

## 2018-06-26 NOTE — Progress Notes (Addendum)
PROGRESS NOTE  Dawn Nichols:[id number] DOB: 05-25-1956 DOA: 06/24/2018 PCP: Patient, No Pcp Per   LOS: 2 days   Brief narrative:  Assessment/Plan:  Active Problems:   Pulmonary embolism (HCC)   Acute CHF (congestive heart failure) (HCC)   NSTEMI (non-ST elevated myocardial infarction) ([place])   Emphysema lung (HCC)   Non-ST elevated myocardial infarction (HCC)   Thrombus of right ventricle following MI ([place])  Bilateral pulmonary embolism.  Lower extremity duplex ultrasound negative for DVT.  Currently on Heparin drip.  Hemodynamically stable at this time.  Respiratory panel was negative.  Non ST elevation MI, with a right ventricular thrombus from possible right ventricle myocardial infarction.. on heparin drip.  Check 2D echocardiogram showed preserved LV function ejection fraction of 55 to 60% but hypokinesis of the basal mid inferior and inferior septal myocardium consistent with ischemia or infarction in the distribution of the right coronary artery.    There was some akinesis of the right ventricular apex consistent with ischemia or infarction with fixed thrombus at the apex.  Patient is awaiting for cardiac catheterization today.  On aspirin and Lipitor.  Will keep n.p.o.  Congestive heart failure, 2D echocardiogram with preserved LV function.  Right ventricle  pressure overload on echocardiogram. BNP elevated at 458.  Some weight loss documented after IV diuretics.    Abormal CT scan of the chest.  Right lung mass.  Possible malignancy.  History of tobacco abuse.  Seen by pulmonary.  Autoimmune work-up labs have been sent.  HIV is negative.  T-max of 98.7 F  Chronic nicotine abuse.  Patient was counseled about it.  She does not wish for a nicotine patch.   Hypokalemia replenished.    Leukocytosis.  No fever.  Likely reactive.  Normalized.  Code Status:  Full code  Family Communication: No one available at bedside.  Disposition Plan: Home in 2 to 3  days   Consultants:  PCCM, cardiology  Procedures:  None  Antibiotics:  None  HPI/Subjective: Patient complains of lower back pain.  Breathing is little better today.  Has cough without hemoptysis.  Denies chest pain.  Objective: Vitals:   06/25/18 2331 06/26/18 0522  BP: 113/85 119/86  Pulse: 71 70  Resp: 19 18  Temp: 98.2 F (36.8 C) 98.1 F (36.7 C)  SpO2: 95% 94%    Intake/Output Summary (Last 24 hours) at 06/26/2018 0927 Last data filed at 06/26/2018 0920 Gross per 24 hour  Intake 803.76 ml  Output 1100 ml  Net -296.24 ml   Filed Weights   06/24/18 2218 06/25/18 0446 06/26/18 0522  Weight: 80.2 kg 80 kg 78.6 kg    Body mass index is 30.7 kg/m.  Physical Exam:  General: Alert awake, not in obvious distress.  Average built.  Overweight  HEENT: PERRLA , oral mucosa is moist.  No pharyngeal erythema  CVS: S1-S2 heard, no murmur.  Regular rate and rhythm.  Respiratory: Diminished breath sounds bilaterally.  Crackles over the bases.  Abdomen: Soft, nontender bowel sounds are present.  Nondistended  CNS: Alert awake oriented.  No focal neurological deficits.  SKIN: Dry. No rashes.  Musculoskeletal: Normal muscle tone, power and bulk.  Bilateral lower extremity with venous insufficiency.  Data Review: I have personally reviewed the following laboratory data and studies,  CBC: Recent Labs  Lab 06/24/18 2325 06/25/18 0426 06/26/18 0449  WBC 12.0* 11.9* 8.7  NEUTROABS 8.6*  --   --   HGB 13.8 13.5 13.4  HCT 43.6 41.1 44.3  MCV 87.9 87.6 90.4  PLT 300 277 546   Basic Metabolic Panel: Recent Labs  Lab 06/24/18 2325 06/25/18 0426 06/26/18 0449  NA 142  --  140  K 3.2*  --  3.5  CL 100  --  100  CO2 32  --  30  GLUCOSE 107*  --  110*  BUN 11  --  11  CREATININE 0.85  --  0.73  CALCIUM 8.4*  --  8.3*  MG  --  1.9 2.2   Liver Function Tests: Recent Labs  Lab 06/24/18 2325  AST 100*  ALT 109*  ALKPHOS 28*  BILITOT 1.1  PROT 7.0   ALBUMIN 2.9*   No results for input(s): LIPASE, AMYLASE in the last 168 hours. No results for input(s): AMMONIA in the last 168 hours. Cardiac Enzymes: Recent Labs  Lab 06/24/18 2325 06/25/18 0426 06/25/18 1332  TROPONINI 1.76* 1.83* 1.09*   BNP (last 3 results) Recent Labs    06/25/18 1521  BNP 458.3*    ProBNP (last 3 results) No results for input(s): PROBNP in the last 8760 hours.  CBG: No results for input(s): GLUCAP in the last 168 hours. Recent Results (from the past 240 hour(s))  Respiratory Panel by PCR     Status: None   Collection Time: 06/25/18  2:42 PM  Result Value Ref Range Status   Adenovirus NOT DETECTED NOT DETECTED Final   Coronavirus 229E NOT DETECTED NOT DETECTED Final   Coronavirus HKU1 NOT DETECTED NOT DETECTED Final   Coronavirus NL63 NOT DETECTED NOT DETECTED Final   Coronavirus OC43 NOT DETECTED NOT DETECTED Final   Metapneumovirus NOT DETECTED NOT DETECTED Final   Rhinovirus / Enterovirus NOT DETECTED NOT DETECTED Final   Influenza A NOT DETECTED NOT DETECTED Final   Influenza A H1 NOT DETECTED NOT DETECTED Final   Influenza A H1 2009 NOT DETECTED NOT DETECTED Final   Influenza A H3 NOT DETECTED NOT DETECTED Final   Influenza B NOT DETECTED NOT DETECTED Final   Parainfluenza Virus 1 NOT DETECTED NOT DETECTED Final   Parainfluenza Virus 2 NOT DETECTED NOT DETECTED Final   Parainfluenza Virus 3 NOT DETECTED NOT DETECTED Final   Parainfluenza Virus 4 NOT DETECTED NOT DETECTED Final   Respiratory Syncytial Virus NOT DETECTED NOT DETECTED Final   Bordetella pertussis NOT DETECTED NOT DETECTED Final   Chlamydophila pneumoniae NOT DETECTED NOT DETECTED Final   Mycoplasma pneumoniae NOT DETECTED NOT DETECTED Final    Comment: Performed at [facility], [place]. [address], [place], [place] [address]     Studies: No results found.  Scheduled Meds: . aspirin  81 mg Oral Daily  . atorvastatin  80 mg Oral q1800  . furosemide  20 mg Intravenous  Daily   Continuous Infusions: . heparin 1,700 Units/hr (06/26/18 0800)    Time spent: More than 50% of that time was spent in counseling and/or coordination of care.  [facility] [name]  [facility] Pager [phone].  If 7PM-7AM, please contact night-coverage at [email or web address], password [email or web address] 06/26/2018, 9:27 AM

## 2018-06-26 NOTE — Progress Notes (Signed)
Progress Note  Patient Name: Dawn Nichols Date of Encounter: 06/26/2018  Primary Cardiologist:  Elouise Munroe, MD  Subjective   Seen just prior to cardiac cath. She has no acute concerns, is eager to go home from the hospital.  Inpatient Medications    Scheduled Meds: . [START ON 06/27/2018] aspirin  81 mg Oral Daily  . atorvastatin  80 mg Oral q1800  . furosemide  20 mg Intravenous Daily  . sodium chloride flush  3 mL Intravenous Q12H   Continuous Infusions: . sodium chloride 125 mL/hr at 06/26/18 2101  . sodium chloride    . heparin 1,800 Units/hr (06/26/18 2100)   PRN Meds: sodium chloride, acetaminophen, diazepam, gabapentin, ondansetron (ZOFRAN) IV, ondansetron **OR** [DISCONTINUED] ondansetron (ZOFRAN) IV, oxyCODONE-acetaminophen **AND** oxyCODONE, sodium chloride flush   Vital Signs    Vitals:   06/26/18 1656 06/26/18 1701 06/26/18 2004 06/26/18 2007  BP: 128/84 128/84 120/65   Pulse: 77 77 93   Resp: (!) 21 20 18    Temp:   98 F (36.7 C)   TempSrc:   Oral   SpO2: (!) 88% 90% (!) 86% 92%  Weight:      Height:        Intake/Output Summary (Last 24 hours) at 06/26/2018 2154 Last data filed at 06/26/2018 2147 Gross per 24 hour  Intake 240 ml  Output 1050 ml  Net -810 ml   Filed Weights   06/24/18 2218 06/25/18 0446 06/26/18 0522  Weight: 80.2 kg 80 kg 78.6 kg    Telemetry    SR, no sig ectopy - Personally Reviewed  ECG    10/14, SR, HR 84, PAC seen, inferolateral ST changes, repeat pending - Personally Reviewed  Physical Exam   Constitutional: No acute distress Eyes: pupils equally round and reactive to light, sclera non-icteric, normal conjunctiva and lids ENMT: moist mucous membranes Cardiovascular: regular rhythm, normal rate, no murmurs. S1 and S2 normal. Radial pulses normal bilaterally. No jugular venous distention.  Respiratory: clear to auscultation bilaterally GI : normal bowel sounds, soft and nontender. No distention.   MSK:  extremities warm, well perfused. No edema.  NEURO: grossly nonfocal exam, moves all extremities. PSYCH: alert and oriented x 3, normal mood and affect.  Labs    Hematology Recent Labs  Lab 06/24/18 2325 06/25/18 0426 06/26/18 0449  WBC 12.0* 11.9* 8.7  RBC 4.96 4.69 4.90  HGB 13.8 13.5 13.4  HCT 43.6 41.1 44.3  MCV 87.9 87.6 90.4  MCH 27.8 28.8 27.3  MCHC 31.7 32.8 30.2  RDW 13.5 13.5 13.7  PLT 300 277 275    Chemistry Recent Labs  Lab 06/24/18 2325 06/26/18 0449  NA 142 140  K 3.2* 3.5  CL 100 100  CO2 32 30  GLUCOSE 107* 110*  BUN 11 11  CREATININE 0.85 0.73  CALCIUM 8.4* 8.3*  PROT 7.0  --   ALBUMIN 2.9*  --   AST 100*  --   ALT 109*  --   ALKPHOS 28*  --   BILITOT 1.1  --   GFRNONAA >60 >60  GFRAA >60 >60  ANIONGAP 10 10     Cardiac Enzymes Recent Labs  Lab 06/24/18 2325 06/25/18 0426 06/25/18 1332  TROPONINI 1.76* 1.83* 1.09*   No results for input(s): TROPIPOC in the last 168 hours.   BNP    Component Value Date/Time   BNP 458.3 (H) 06/25/2018 1521    ProBNP No results found for: [place]   Radiology  Images from CT Chest angio not available for review of PE or coronary calcifications.  Cardiac Studies   ECHO:  06/24/2018 - Left ventricle: The cavity size was normal. Systolic function was normal. The estimated ejection fraction was in the range of 55% to 60%. Possible hypokinesis of the basal-midinferior and inferoseptal myocardium; consistent with ischemia or infarction in the distribution of the right coronary artery. - Ventricular septum: The contour showed systolic flattening. These changes are consistent with RV pressure overload. - Right ventricle: Akinesis of the RV apex, consistent with   ischemia or infarction. There was a possible, medium-sized, 2.8 cm (L) x 1.3 cm (W), protruding, fixed thrombus at the apex, associated with an akinetic segment; based on its appearance, thrombus cannot be excluded.  Impressions:  -  Right ventricular function is depressed due to regional apical   dysfunction, with preserved annular excursion. This is the   opposite of the expected pattern seen in acute pulmonary   embolism. Consider primary right ventricular infarction. Possible RV apical thrombus, which could be the source of pulmonary embolism, especially if there is no evidence for lower extremity DVT.   Patient Profile     62 y.o. female w/ hx tob use was admitted from [facility] w/ elevated pro-BNP, elevated trop, CT +small bilateral PE, severe emphysema, ?RLL mass, ?infection/inflammation R lung. Had Lasix w/ good UOP, on hep gtt.   Assessment & Plan   Active Problems:   Pulmonary embolism (HCC)   Acute CHF (congestive heart failure) (HCC)   NSTEMI (non-ST elevated myocardial infarction) (HCC)   Emphysema lung (HCC)   Non-ST elevated myocardial infarction (HCC)   Lung mass   Hypoxemia   Ground glass opacity present on imaging of lung   Thrombus of right ventricle without MI  Active Problems: Pulmonary embolism (HCC) -Lower extremity Dopplers have been done, negative for DVT - Likely RV thrombus - will need to decide on oral anticoagulation  RV thrombus: We will need to convert to oral anticoagulation tomorrow. Continue heparin 4-6 hours after cath if access site stable. Will discuss options of DOAC vs lovenox  She will need follow up imaging in approx 4 weeks to reevaluate.  2.  Acute CHF (congestive heart failure) (HCC) - Acute diastolic CHF with RV dysfunction Has done well on gentle diuresis   3.  NSTEMI (non-ST elevated myocardial infarction) (HCC) - Elevation in cardiac enzymes and ECG is abnormal. - Echocardiogram is also abnormal with RV wall motion abnormalities concerning for RCA infarct - add ASA, high-dose statin - Reviewed angiogram - Cath today demonstrates mild CAD  4.  Emphysema lung (HCC) - per IM  5. Possible lung CA: - Pt being evaluated by Pulm team, prelim plan is  for repeat CT and decide on further eval as oupt.  Elouise Munroe, MD [facility] 9:54 PM  06/26/2018

## 2018-06-26 NOTE — Progress Notes (Signed)
ANTICOAGULATION CONSULT NOTE   Pharmacy Consult for Heparin  Indication: pulmonary embolus  No Known Allergies  Patient Measurements: Height: 5\' 3"  (160 cm) Weight: 173 lb 4.8 oz (78.6 kg) IBW/kg (Calculated) : 52.4 Vital Signs: Temp: 98.1 F (36.7 C) (10/16 0522) Temp Source: Oral (10/16 0522) BP: 119/86 (10/16 0522) Pulse Rate: 70 (10/16 0522)  Labs: Recent Labs    06/24/18 2325  06/25/18 0426 06/25/18 1332 06/25/18 2111 06/26/18 0449  HGB 13.8  --  13.5  --   --  13.4  HCT 43.6  --  41.1  --   --  44.3  PLT 300  --  277  --   --  275  HEPARINUNFRC  --    < > <0.10* <0.10* <0.10* 0.14*  CREATININE 0.85  --   --   --   --  0.73  TROPONINI 1.76*  --  1.83* 1.09*  --   --    < > = values in this interval not displayed.    Estimated Creatinine Clearance: 73.3 mL/min (by C-G formula based on SCr of 0.73 mg/dL).  Assessment: 62 y/o F transfer from outside hospital with heparin running at 1000 units/hr for new onset PE.   Heparin level this afternoon came back undetectable, on 1150 units/hr. Hgb 13.5, plt 277. No s/sx of bleeding. Venous duplex negative for DVT. ECHO showing possible RV apical thrombus.  Heparin level 0.14 units/ml this am.  No issues with infusion   Goal of Therapy:  Heparin level 0.3-0.7 units/ml Monitor platelets by anticoagulation protocol: Yes   Plan:  Heparin 2000 units BOLUS Increase heparin drip to 1500 units/hr Obtain heparin level 6 hours after rate increase Monitor daily heparin level, CBC, and for s/sx of bleeding Will monitor for oral anticoagulation plans  Excell Seltzer, PharmD Clinical Pharmacist

## 2018-06-26 NOTE — Progress Notes (Addendum)
ANTICOAGULATION CONSULT NOTE   Pharmacy Consult for Heparin  Indication: pulmonary embolus  No Known Allergies  Patient Measurements: Height: 5\' 3"  (160 cm) Weight: 173 lb 4.8 oz (78.6 kg) IBW/kg (Calculated) : 52.4 Vital Signs: Temp: 98 F (36.7 C) (10/16 1205) Temp Source: Oral (10/16 1205) BP: 114/79 (10/16 1205) Pulse Rate: 74 (10/16 1205)  Labs: Recent Labs    06/24/18 2325  06/25/18 0426 06/25/18 1332 06/25/18 2111 06/26/18 0449 06/26/18 1501  HGB 13.8  --  13.5  --   --  13.4  --   HCT 43.6  --  41.1  --   --  44.3  --   PLT 300  --  277  --   --  275  --   HEPARINUNFRC  --    < > <0.10* <0.10* <0.10* 0.14* 0.30  CREATININE 0.85  --   --   --   --  0.73  --   TROPONINI 1.76*  --  1.83* 1.09*  --   --   --    < > = values in this interval not displayed.    Estimated Creatinine Clearance: 73.3 mL/min (by C-G formula based on SCr of 0.73 mg/dL).  Assessment: 62 y/o F transfer from outside hospital with heparin running at 1000 units/hr for new onset PE.   Heparin level came back therapeutic at 0.3, on 1700 units/hr. No s/sx of bleeding or infusion issues per nursing. Venous duplex negative for DVT. ECHO showing possible RV apical thrombus.  Goal of Therapy:  Heparin level 0.3-0.7 units/ml Monitor platelets by anticoagulation protocol: Yes   Plan:  Increase heparin drip to 1800 units/hr to keep within therapeutic range>> however, patient in route to cath lab currently  Monitor daily heparin level, CBC, and for s/sx of bleeding Will monitor for oral anticoagulation plans after cardiac cath  Doylene Canard, PharmD Clinical Pharmacist  Pager: [phone] Phone: [phone]  ADDENDUM Cath showing mild non-obstructive CAD - plan for medical management. Start heparin 4 hours after sheath removal (10/16@1700 ) for apical thrombus + PE. Will resume at 1800 units/hr at 2100 and obtain heparin level 6 hours after restart.   Doylene Canard, PharmD Clinical Pharmacist

## 2018-06-26 NOTE — Progress Notes (Addendum)
NAME:  Dawn Nichols, MRN:  [id number], DOB:  1956/03/31, LOS: 2 ADMISSION DATE:  06/24/2018, CONSULTATION DATE:  06/25/2018 REFERRING MD:  Dr. Louanne Belton, CHIEF COMPLAINT:  PE/ lung mass   Brief History   62 yoF w/ 3 week history of progressive SOB found at OSH to have small bilateral PE's, and RLL mass.  Additionally found to have elevated troponin and BNP with EKG changes.  Cardiology evaluating for possible NSTEMI.    Past Medical History  Tobacco abuse, peripheral neuropathy, scoliosis  Significant Hospital Events   10/14 Transferred to [facility] 10/15 Pulm consult  Consults: date of consult/date signed off & final recs:  10/15 Cards 10/15 PCCM  Procedures (surgical and bedside):   Significant Diagnostic Tests:  10/14 CTA PE >> Small bilateral PE; severe emphysema w/ extensive septal thickening and concern for new parenchymal disease in the right lung which could represent an infectious or inflammatory process.  Emphysema.  Mediastinal and hilar lymphadenopathy.  Concern for RLL or right hilar lesion measuring 2.4 cm.   10/15 TTE>>EF 55-60%, depressed RV function, possible RV apical thrombus  10/15 Bilateral venous duplex >> neg for DVT   Micro Data:  RVP 10/15>>> neg   Antimicrobials:  none  Subjective:  Feels fine.  Some mild SOB, mostly with exertion  For cardiac cath today  On heparin gtt   Objective   Blood pressure 114/79, pulse 74, temperature 98 F (36.7 C), temperature source Oral, resp. rate 18, height 5\' 3"  (1.6 m), weight 78.6 kg, SpO2 96 %.        Intake/Output Summary (Last 24 hours) at 06/26/2018 1405 Last data filed at 06/26/2018 0920 Gross per 24 hour  Intake 480 ml  Output 1100 ml  Net -620 ml   Filed Weights   06/24/18 2218 06/25/18 0446 06/26/18 0522  Weight: 80.2 kg 80 kg 78.6 kg    Examination: General:  wdwn female, NAD in bed  HEENT: MM pink/moist, no obvious JVD Neuro: awake, alert, appropriate, MAE  CV:  rrr, no mumur PULM: resps  even/non-labored on [place], clear  GI: obese, soft, non-tender, bs active  Extremities: warm/dry, +1 BLE pitting edema, no erythema/ warmth/ tenderness Skin: no rashes   Resolved Hospital Problem list    Assessment & Plan:  PE - hemodynamically stable.  Neg venous doppler.   PLAN -  Continue heparin gtt  Prefer NOAC bridge given need for eventual FOB  Supplemental O2 as needed to keep sats 88-92%  Ambulatory desat prior to d.c   GGO - ?autoimmune v infection v pulmonary edema.  Autoimmune w/u essentially neg thus far.  PLAN -  outpt pulm f/u - scheduled November 7 at 215 pm with Wyn Quaker, NP With plans on repeat imaging in 6-8 weeks,  PFTs  RVP NEG No role steroids at this time   NSTEMI  ? R ventricular thrombus  PLAN -  Cardiology following  For cath today     Disposition / Summary of Today's Plan 06/26/18   For cath today  outpt pulmonary follow up as above  PCCM signing off please call back if needed      Labs   CBC: Recent Labs  Lab 06/24/18 2325 06/25/18 0426 06/26/18 0449  WBC 12.0* 11.9* 8.7  NEUTROABS 8.6*  --   --   HGB 13.8 13.5 13.4  HCT 43.6 41.1 44.3  MCV 87.9 87.6 90.4  PLT 300 277 607    Basic Metabolic Panel: Recent Labs  Lab 06/24/18 2325  06/25/18 0426 06/26/18 0449  NA 142  --  140  K 3.2*  --  3.5  CL 100  --  100  CO2 32  --  30  GLUCOSE 107*  --  110*  BUN 11  --  11  CREATININE 0.85  --  0.73  CALCIUM 8.4*  --  8.3*  MG  --  1.9 2.2   GFR: Estimated Creatinine Clearance: 73.3 mL/min (by C-G formula based on SCr of 0.73 mg/dL). Recent Labs  Lab 06/24/18 2325 06/25/18 0426 06/25/18 1521 06/26/18 0449  PROCALCITON  --   --  <0.10  --   WBC 12.0* 11.9*  --  8.7    Liver Function Tests: Recent Labs  Lab 06/24/18 2325  AST 100*  ALT 109*  ALKPHOS 28*  BILITOT 1.1  PROT 7.0  ALBUMIN 2.9*   No results for input(s): LIPASE, AMYLASE in the last 168 hours. No results for input(s): AMMONIA in the last 168  hours.  ABG No results found for: PHART, PCO2ART, PO2ART, HCO3, TCO2, ACIDBASEDEF, O2SAT   Coagulation Profile: No results for input(s): INR, PROTIME in the last 168 hours.  Cardiac Enzymes: Recent Labs  Lab 06/24/18 2325 06/25/18 0426 06/25/18 1332  TROPONINI 1.76* 1.83* 1.09*    HbA1C: No results found for: HGBA1C  CBG: No results for input(s): GLUCAP in the last 168 hours.   Nickolas Madrid, NP 06/26/2018  2:05 PM Pager: [phone] or [phone]  Attending Note:  62 year old female long term smoker presenting with SOB, a CTA was done that I reviewed myself that noted a PE, lungs mass and diffuse GGO.  On exam, bibasilar crackles.  RVP is negative, PCT <.10 and auto-immune work-up not complete yet but negative thus far.  Discussed with PCCM-NP.  PE:             - Anticoagulation             - Start NOAC if that is the patient's choice  Hypoxemia:             - Titrate O2 for sat of 88-92%             - Will need an ambulatory desat study prior to discharge for home O2  Lungs mass:             - Repeat chest CT in 6-8 wks with f/u with PCCM as outpatient, apt made  GGO:             - Maintain dry as able             - F/U on auto-immune work up  [employer] will sign off, please call back if needed.  Patient seen and examined, agree with above note.  I dictated the care and orders written for this patient under my direction.  Rush Farmer, [place]

## 2018-06-27 ENCOUNTER — Other Ambulatory Visit: Payer: Self-pay | Admitting: Cardiology

## 2018-06-27 ENCOUNTER — Encounter (HOSPITAL_COMMUNITY): Payer: Self-pay | Admitting: Cardiovascular Disease

## 2018-06-27 DIAGNOSIS — I240 Acute coronary thrombosis not resulting in myocardial infarction: Secondary | ICD-10-CM

## 2018-06-27 DIAGNOSIS — I513 Intracardiac thrombosis, not elsewhere classified: Principal | ICD-10-CM

## 2018-06-27 LAB — CBC
HCT: 46.9 % — ABNORMAL HIGH (ref 36.0–46.0)
Hemoglobin: 13.9 g/dL (ref 12.0–15.0)
MCH: 27.3 pg (ref 26.0–34.0)
MCHC: 29.6 g/dL — ABNORMAL LOW (ref 30.0–36.0)
MCV: 92 fL (ref 80.0–100.0)
NRBC: 0 % (ref 0.0–0.2)
Platelets: 307 10*3/uL (ref 150–400)
RBC: 5.1 MIL/uL (ref 3.87–5.11)
RDW: 13.9 % (ref 11.5–15.5)
WBC: 8.1 10*3/uL (ref 4.0–10.5)

## 2018-06-27 LAB — BASIC METABOLIC PANEL
ANION GAP: 11 (ref 5–15)
BUN: 11 mg/dL (ref 8–23)
CALCIUM: 8 mg/dL — AB (ref 8.9–10.3)
CO2: 26 mmol/L (ref 22–32)
Chloride: 102 mmol/L (ref 98–111)
Creatinine, Ser: 0.75 mg/dL (ref 0.44–1.00)
GFR calc Af Amer: >60 mL/min (ref >60)
Glucose, Bld: 93 mg/dL (ref 70–99)
Potassium: 3.3 mmol/L — ABNORMAL LOW (ref 3.5–5.1)
Sodium: 139 mmol/L (ref 135–145)

## 2018-06-27 LAB — HEPARIN LEVEL (UNFRACTIONATED): Heparin Unfractionated: 0.15 IU/mL — ABNORMAL LOW (ref 0.30–0.70)

## 2018-06-27 LAB — MAGNESIUM: Magnesium: 2.1 mg/dL (ref 1.7–2.4)

## 2018-06-27 MED ORDER — APIXABAN 5 MG PO TABS: 5.0000 mg | ORAL_TABLET | Freq: Two times a day (BID) | ORAL | Status: DC

## 2018-06-27 MED ORDER — SALINE SPRAY 0.65 % NA SOLN
1.0000 | NASAL | Status: DC | PRN
  Administered 2018-06-27: 1 via NASAL
  Filled 2018-06-27 (×2): qty 44

## 2018-06-27 MED ORDER — APIXABAN 5 MG PO TABS
10.0000 mg | ORAL_TABLET | Freq: Two times a day (BID) | ORAL | Status: DC
  Administered 2018-06-27 – 2018-06-28 (×2): 10 mg via ORAL
  Filled 2018-06-27 (×2): qty 2

## 2018-06-27 MED ORDER — OXYMETAZOLINE HCL 0.05 % NA SOLN
2.0000 | Freq: Two times a day (BID) | NASAL | Status: DC | PRN
  Filled 2018-06-27: qty 15

## 2018-06-27 NOTE — Progress Notes (Signed)
   Patient was ambulated by nursing student 120 feet without oxygen per MD order.   Initial Oxygen check before ambulation.  06/27/18 1530  Oxygen Therapy  SpO2 88 %  O2 Device Room air  O2 Flow Rate (L/min)    During ambulation patient was Asymptomatic.  06/27/18 1530  Oxygen Therapy  SpO2 79 %  O2 Device Room air  O2 Flow Rate (L/min)    While ambulating oxygen was applied due to decrease oxygen stats . Patient was still asymptomatic.  06/27/18 1545  Oxygen Therapy  SpO2 84%  O2 Device Nasal Cannula  O2 Flow Rate (L/min) 3 L/min   After resting in room with oxygen applied and asymptomatic.    06/27/18 1550  Oxygen Therapy  SpO2 93 %  O2 Device Nasal Cannula  O2 Flow Rate (L/min) 3 L/min   Patient is currently resting, patient denies chest pain and shortness of breath.

## 2018-06-27 NOTE — Plan of Care (Signed)

## 2018-06-27 NOTE — Progress Notes (Signed)
PROGRESS NOTE  Dawn Nichols EXB:[id number] DOB: 02-Feb-1956 DOA: 06/24/2018 PCP: Patient, No Pcp Per   LOS: 3 days   Brief narrative:   Assessment/Plan:  Active Problems:   Pulmonary embolism (HCC)   Acute CHF (congestive heart failure) ([place])   NSTEMI (non-ST elevated myocardial infarction) ([place])   Emphysema lung (HCC)   Non-ST elevated myocardial infarction (HCC)   Lung mass   Hypoxemia   Ground glass opacity present on imaging of lung   Thrombus of right ventricle without MI  Acute hypoxic respiratory failure secondary to bilateral pulmonary embolism, possible COPD, pulmonary vascular congestion, non-ST elevation MI.  Lower extremity duplex ultrasound negative for DVT.  Currently on Heparin drip.  Hemodynamically stable at this time.  Respiratory panel was negative.  Non ST elevation MI, with a right ventricular thrombus from possible right ventricle myocardial infarction.. on heparin drip.  Check 2D echocardiogram showed preserved LV function ejection fraction of 55 to 60% but hypokinesis of the basal mid inferior and inferior septal myocardium consistent with ischemia or infarction in the distribution of the right coronary artery.    There was some akinesis of the right ventricular apex consistent with ischemia or infarction with fixed thrombus at the apex.  Patient is awaiting for cardiac catheterization today.  On aspirin and Lipitor.  Will keep n.p.o.  Congestive heart failure, 2D echocardiogram with preserved LV function.  Right ventricle  pressure overload on echocardiogram. BNP elevated at 458.  Some weight loss documented after IV diuretics.    Abormal CT scan of the chest.  Right lung mass.  Possible malignancy.  History of tobacco abuse.  Seen by pulmonary.  Autoimmune work-up labs have been sent.  HIV is negative.  T-max of 98.7 F  Chronic nicotine abuse.  Patient was counseled about it.  She does not wish for a nicotine patch.   Hypokalemia replenished.     Leukocytosis.  No fever.  Likely reactive.  Normalized.  Code Status:  Full code  Family Communication: No one available at bedside.  Disposition Plan: Home in 2 to 3 days   Consultants:  PCCM, cardiology  Procedures:  None  Antibiotics:  None  HPI/Subjective: Patient complains of lower back pain.  Breathing is little better today.  Has cough without hemoptysis.  Denies chest pain.  Objective: Vitals:   06/27/18 0600 06/27/18 0800  BP: 114/79 102/69  Pulse: 72 71  Resp: 20 18  Temp:  98.1 F (36.7 C)  SpO2: 91% 93%    Intake/Output Summary (Last 24 hours) at 06/27/2018 1055 Last data filed at 06/27/2018 1001 Gross per 24 hour  Intake 1654.54 ml  Output 700 ml  Net 954.54 ml   Filed Weights   06/25/18 0446 06/26/18 0522 06/27/18 0413  Weight: 80 kg 78.6 kg 78.9 kg    Body mass index is 30.82 kg/m.  Physical Exam:  General: Alert awake, not in obvious distress.  Average built.  Overweight  HEENT: PERRLA , oral mucosa is moist.  No pharyngeal erythema  CVS: S1-S2 heard, no murmur.  Regular rate and rhythm.  Respiratory: Diminished breath sounds bilaterally.  Crackles over the bases.  Abdomen: Soft, nontender bowel sounds are present.  Nondistended  CNS: Alert awake oriented.  No focal neurological deficits.  SKIN: Dry. No rashes.  Musculoskeletal: Normal muscle tone, power and bulk.  Bilateral lower extremity with venous insufficiency.  Data Review: I have personally reviewed the following laboratory data and studies,  CBC: Recent Labs  Lab 06/24/18 2325  06/25/18 0426 06/26/18 0449 06/27/18 0547  WBC 12.0* 11.9* 8.7 8.1  NEUTROABS 8.6*  --   --   --   HGB 13.8 13.5 13.4 13.9  HCT 43.6 41.1 44.3 46.9*  MCV 87.9 87.6 90.4 92.0  PLT 300 277 275 937   Basic Metabolic Panel: Recent Labs  Lab 06/24/18 2325 06/25/18 0426 06/26/18 0449 06/27/18 0547  NA 142  --  140 139  K 3.2*  --  3.5 3.3*  CL 100  --  100 102  CO2 32  --  30  26  GLUCOSE 107*  --  110* 93  BUN 11  --  11 11  CREATININE 0.85  --  0.73 0.75  CALCIUM 8.4*  --  8.3* 8.0*  MG  --  1.9 2.2 2.1   Liver Function Tests: Recent Labs  Lab 06/24/18 2325  AST 100*  ALT 109*  ALKPHOS 28*  BILITOT 1.1  PROT 7.0  ALBUMIN 2.9*   No results for input(s): LIPASE, AMYLASE in the last 168 hours. No results for input(s): AMMONIA in the last 168 hours. Cardiac Enzymes: Recent Labs  Lab 06/24/18 2325 06/25/18 0426 06/25/18 1332  TROPONINI 1.76* 1.83* 1.09*   BNP (last 3 results) Recent Labs    06/25/18 1521  BNP 458.3*    ProBNP (last 3 results) No results for input(s): PROBNP in the last 8760 hours.  CBG: No results for input(s): GLUCAP in the last 168 hours. Recent Results (from the past 240 hour(s))  Respiratory Panel by PCR     Status: None   Collection Time: 06/25/18  2:42 PM  Result Value Ref Range Status   Adenovirus NOT DETECTED NOT DETECTED Final   Coronavirus 229E NOT DETECTED NOT DETECTED Final   Coronavirus HKU1 NOT DETECTED NOT DETECTED Final   Coronavirus NL63 NOT DETECTED NOT DETECTED Final   Coronavirus OC43 NOT DETECTED NOT DETECTED Final   Metapneumovirus NOT DETECTED NOT DETECTED Final   Rhinovirus / Enterovirus NOT DETECTED NOT DETECTED Final   Influenza A NOT DETECTED NOT DETECTED Final   Influenza A H1 NOT DETECTED NOT DETECTED Final   Influenza A H1 2009 NOT DETECTED NOT DETECTED Final   Influenza A H3 NOT DETECTED NOT DETECTED Final   Influenza B NOT DETECTED NOT DETECTED Final   Parainfluenza Virus 1 NOT DETECTED NOT DETECTED Final   Parainfluenza Virus 2 NOT DETECTED NOT DETECTED Final   Parainfluenza Virus 3 NOT DETECTED NOT DETECTED Final   Parainfluenza Virus 4 NOT DETECTED NOT DETECTED Final   Respiratory Syncytial Virus NOT DETECTED NOT DETECTED Final   Bordetella pertussis NOT DETECTED NOT DETECTED Final   Chlamydophila pneumoniae NOT DETECTED NOT DETECTED Final   Mycoplasma pneumoniae NOT DETECTED  NOT DETECTED Final    Comment: Performed at [facility], [place]. [address], [place], [place] [address]     Studies: No results found.  Scheduled Meds: . aspirin  81 mg Oral Daily  . atorvastatin  80 mg Oral q1800  . furosemide  20 mg Intravenous Daily  . sodium chloride flush  3 mL Intravenous Q12H   Continuous Infusions: . sodium chloride Stopped (06/26/18 2314)  . sodium chloride    . heparin 2,100 Units/hr (06/27/18 0949)    Time spent: More than 50% of that time was spent in counseling and/or coordination of care.  [facility] Brynlee Pennywell  [facility] Pager [phone].   If 7PM-7AM, please contact night-coverage at [email or web address], password [facility] 06/27/2018, 10:55 AM

## 2018-06-27 NOTE — Progress Notes (Signed)
   Patient current oxygen level is as it follows.  Patient denies shortness of breath.    06/27/18 1809  Vitals  BP 111/77  MAP (mmHg) 86  BP Method Automatic  Patient Position (if appropriate) Sitting  Pulse Rate 71  Pulse Rate Source Monitor  Oxygen Therapy  SpO2 97 %  O2 Device Nasal Cannula  O2 Flow Rate (L/min) 2 L/min

## 2018-06-27 NOTE — Progress Notes (Signed)
PROGRESS NOTE  Dawn Nichols NFA:[id number] DOB: [birth year] DOA: 06/24/2018 PCP: Patient, No Pcp Per   LOS: 3 days   Brief narrative:   Dawn Nichols is a 62 y.o. female with history of tobacco abuse and scoliosis with low back pain and peripheral neuropathy presented to the hospital with shortness of breath on exertion for 3 weeks prior to presentation.  In the ED at [facility] she had a CT scan angiogram of the chest patient was positive for bilateral pulmonary embolism and a finding showed right hilar/lower lobe malignancy.  Troponin was also elevated with anterolateral T wave inversions.  Patient was then transferred to our hospital.  After admission, following conditions were addressed ,  Assessment/Plan:  Active Problems:   Pulmonary embolism (HCC)   Acute CHF (congestive heart failure) (HCC)   NSTEMI (non-ST elevated myocardial infarction) ([place])   Emphysema lung (HCC)   Non-ST elevated myocardial infarction ([place])   Lung mass   Hypoxemia   Ground glass opacity present on imaging of lung   Thrombus of right ventricle without MI  Bilateral pulmonary embolism with RV thrombus.  Will change to the DOAC today.  Discontinue heparin.  Wean off oxygen.  Ambulate the patient.  Lower extremity duplex ultrasound negative.  Patient will need outpatient echocardiogram in 4 weeks to reevaluate for RV thrombus..  Non-ST elevation MI status post cardiac cath. Mild obstructive CAD.  Cardiology on board.  Consider aspirin on discharge.  Emphysema on the CT scan.  Will monitor for oxygen need prior to discharge.  Smoking counseling has been done multiple times.  Ambulate the patient with pulse oximetry.  Right-sided lung mass, right hilar/lower lobe lesion around 2.4 cm.  With mediastinal and hilar lymphadenopathy.  Patient will need to follow-up with pulmonary as outpatient in 6 to 8 weeks.  Patient has been followed by pulmonary.  Acute diastolic heart failure with RV dysfunction.  Patient has  improved with diuresis.   VTE Prophylaxis: Heparin drip  Code Status:  Full code  Family Communication: No one at bedside  Disposition Plan: Change to oral anticoagulation.  Ambulate.  Wean oxygen.  Tentative discharge home in a.m if patient remains stable.   Consultants:  Cardiology, pulmonary  Procedures:  Cardiac catheterization 06/26/2018  Antibiotics:  None  Subjective: Patient denies any chest pain or shortness of breath.  Objective: Vitals:   06/27/18 0800 06/27/18 1200  BP: 102/69 101/79  Pulse: 71 69  Resp: 18 20  Temp: 98.1 F (36.7 C) 98.2 F (36.8 C)  SpO2: 93% 98%    Intake/Output Summary (Last 24 hours) at 06/27/2018 1304 Last data filed at 06/27/2018 1001 Gross per 24 hour  Intake 1654.54 ml  Output 700 ml  Net 954.54 ml   Filed Weights   06/25/18 0446 06/26/18 0522 06/27/18 0413  Weight: 80 kg 78.6 kg 78.9 kg    Physical Exam:  General: Alert awake, not in obvious distress.  Average built.  Nasal cannula.  HEENT: PERRLA , oral mucosa is moist.  No pharyngeal erythema  CVS: S1-S2 heard, no murmur.  Regular rate and rhythm.  Respiratory: Diminished breath sounds bilaterally.  No crackles or wheezes  Abdomen: Soft, nontender bowel sounds are present.  Nondistended  CNS: Alert awake oriented.  No focal neurological deficits.  SKIN: Dry. No rashes.  Musculoskeletal: Normal muscle tone, power and bulk  Data Review: I have personally reviewed the following laboratory data and studies,  CBC: Recent Labs  Lab 06/24/18 2325 06/25/18 0426 06/26/18  0449 06/27/18 0547  WBC 12.0* 11.9* 8.7 8.1  NEUTROABS 8.6*  --   --   --   HGB 13.8 13.5 13.4 13.9  HCT 43.6 41.1 44.3 46.9*  MCV 87.9 87.6 90.4 92.0  PLT 300 277 275 676   Basic Metabolic Panel: Recent Labs  Lab 06/24/18 2325 06/25/18 0426 06/26/18 0449 06/27/18 0547  NA 142  --  140 139  K 3.2*  --  3.5 3.3*  CL 100  --  100 102  CO2 32  --  30 26  GLUCOSE 107*  --  110*  93  BUN 11  --  11 11  CREATININE 0.85  --  0.73 0.75  CALCIUM 8.4*  --  8.3* 8.0*  MG  --  1.9 2.2 2.1   Liver Function Tests: Recent Labs  Lab 06/24/18 2325  AST 100*  ALT 109*  ALKPHOS 28*  BILITOT 1.1  PROT 7.0  ALBUMIN 2.9*   No results for input(s): LIPASE, AMYLASE in the last 168 hours. No results for input(s): AMMONIA in the last 168 hours. Cardiac Enzymes: Recent Labs  Lab 06/24/18 2325 06/25/18 0426 06/25/18 1332  TROPONINI 1.76* 1.83* 1.09*   BNP (last 3 results) Recent Labs    06/25/18 1521  BNP 458.3*    ProBNP (last 3 results) No results for input(s): PROBNP in the last 8760 hours.  CBG: No results for input(s): GLUCAP in the last 168 hours. Recent Results (from the past 240 hour(s))  Respiratory Panel by PCR     Status: None   Collection Time: 06/25/18  2:42 PM  Result Value Ref Range Status   Adenovirus NOT DETECTED NOT DETECTED Final   Coronavirus 229E NOT DETECTED NOT DETECTED Final   Coronavirus HKU1 NOT DETECTED NOT DETECTED Final   Coronavirus NL63 NOT DETECTED NOT DETECTED Final   Coronavirus OC43 NOT DETECTED NOT DETECTED Final   Metapneumovirus NOT DETECTED NOT DETECTED Final   Rhinovirus / Enterovirus NOT DETECTED NOT DETECTED Final   Influenza A NOT DETECTED NOT DETECTED Final   Influenza A H1 NOT DETECTED NOT DETECTED Final   Influenza A H1 2009 NOT DETECTED NOT DETECTED Final   Influenza A H3 NOT DETECTED NOT DETECTED Final   Influenza B NOT DETECTED NOT DETECTED Final   Parainfluenza Virus 1 NOT DETECTED NOT DETECTED Final   Parainfluenza Virus 2 NOT DETECTED NOT DETECTED Final   Parainfluenza Virus 3 NOT DETECTED NOT DETECTED Final   Parainfluenza Virus 4 NOT DETECTED NOT DETECTED Final   Respiratory Syncytial Virus NOT DETECTED NOT DETECTED Final   Bordetella pertussis NOT DETECTED NOT DETECTED Final   Chlamydophila pneumoniae NOT DETECTED NOT DETECTED Final   Mycoplasma pneumoniae NOT DETECTED NOT DETECTED Final     Comment: Performed at [facility], [place]. [address] [place], [place] [address]     Studies: No results found.  Scheduled Meds: . aspirin  81 mg Oral Daily  . atorvastatin  80 mg Oral q1800  . furosemide  20 mg Intravenous Daily  . sodium chloride flush  3 mL Intravenous Q12H   Continuous Infusions: . sodium chloride Stopped (06/26/18 2314)  . sodium chloride    . heparin 2,100 Units/hr (06/27/18 0949)    Time spent: 25 minutes. More than 50% of that time was spent in counseling and/or coordination of care.  Aunesti Pellegrino  [facility] Pager [phone]  If 7PM-7AM, please contact night-coverage at [email or web address], password [facility] 06/27/2018, 1:04 PM

## 2018-06-27 NOTE — Discharge Instructions (Signed)
Information on my medicine - ELIQUIS (apixaban)   Why was Eliquis prescribed for you? Eliquis was prescribed to treat blood clots that may have been found in the veins of your legs (deep vein thrombosis) or in your lungs (pulmonary embolism) and to reduce the risk of them occurring again.  What do You need to know about Eliquis ? The starting dose is 10 mg (two 5 mg tablets) taken TWICE daily for the FIRST SEVEN (7) DAYS, then on 07/04/18  the dose is reduced to ONE 5 mg tablet taken TWICE daily.  Eliquis may be taken with or without food.   Try to take the dose about the same time in the morning and in the evening. If you have difficulty swallowing the tablet whole please discuss with your pharmacist how to take the medication safely.  Take Eliquis exactly as prescribed and DO NOT stop taking Eliquis without talking to the doctor who prescribed the medication.  Stopping may increase your risk of developing a new blood clot.  Refill your prescription before you run out.  After discharge, you should have regular check-up appointments with your healthcare provider that is prescribing your Eliquis.    What do you do if you miss a dose? If a dose of ELIQUIS is not taken at the scheduled time, take it as soon as possible on the same day and twice-daily administration should be resumed. The dose should not be doubled to make up for a missed dose.  Important Safety Information A possible side effect of Eliquis is bleeding. You should call your healthcare provider right away if you experience any of the following: ? Bleeding from an injury or your nose that does not stop. ? Unusual colored urine (red or dark brown) or unusual colored stools (red or black). ? Unusual bruising for unknown reasons. ? A serious fall or if you hit your head (even if there is no bleeding).  Some medicines may interact with Eliquis and might increase your risk of bleeding or clotting while on Eliquis. To help  avoid this, consult your healthcare provider or pharmacist prior to using any new prescription or non-prescription medications, including herbals, vitamins, non-steroidal anti-inflammatory drugs (NSAIDs) and supplements.  This website has more information on Eliquis (apixaban): http://www.eliquis.com/eliquis/home

## 2018-06-27 NOTE — Progress Notes (Signed)
SATURATION QUALIFICATIONS: (This note is used to comply with regulatory documentation for home oxygen)  Patient Saturations on Room Air at Rest = 88%  Patient Saturations on Room Air while Ambulating =79 %  Patient Saturations on 3Liters of oxygen while Ambulating = 93%  Please briefly explain why patient needs home oxygen:

## 2018-06-27 NOTE — Progress Notes (Addendum)
[place] for Heparin  Indication: pulmonary embolus   Patient Measurements: Height: 5\' 3"  (160 cm) Weight: 174 lb (78.9 kg)(Scale B) IBW/kg (Calculated) : 52.4  Heparin dosing weight: 70 kg  Vital Signs: Temp: 98.1 F (36.7 C) (10/17 0800) Temp Source: Oral (10/17 0800) BP: 102/69 (10/17 0800) Pulse Rate: 71 (10/17 0800)  Assessment: 62 y/o female transfer from outside hospital. Venous duplex negative for DVT. ECHO showing possible RV apical thrombus. Patient had cardiac cath yesterday which showed showed mild non-obstructive CAD > will be treated conservatively with medical management.  Heparin was resumed s/p cath for apical thrombus and PE. CTA performed at outside hospital showed multiple small bilateral pulmonary embolisms. Additionally, it was noted that patient has a lesion in her RLL. Heparin level this morning returned SUBtherapeutic. SCr and CBC are wnl and stable.   Goal of Therapy:  Heparin level 0.3-0.7 units/ml Monitor platelets by anticoagulation protocol: Yes    Plan:  -Increase heparin to 2100 units/hr -Daily HL, CBC -Check confirmatory level    Harvel Quale 06/27/2018 9:09 AM  Addendum -Planning to switch to Eliquis this evening -Stop heparin at the same time the first dose of Eliquis is given -Eliquis 10 mg po bid x 7 days then 5 mg po bid   Harvel Quale 06/27/2018 2:39 PM

## 2018-06-27 NOTE — Progress Notes (Signed)
   06/27/18 1200  Pain Assessment  Pain Scale 0-10  Pain Score 5  Pain Type Chronic pain  Pain Location Back  Pain Orientation Mid;Lower  Pain Descriptors / Indicators Throbbing  Pain Frequency Constant  Pain Onset On-going  Patients Stated Pain Goal 0  Pain Intervention(s) Valium 5mg  as needed given to patient     06/27/18 1248  Pain Assessment  Pain Scale 0-10  Pain Score 4  Pain Type Chronic pain  Pain Location Back  Pain Orientation Lower;Mid  Pain Descriptors / Indicators Aching;Throbbing  Pain Frequency Constant

## 2018-06-27 NOTE — Progress Notes (Addendum)
Progress Note  Patient Name: Dawn Nichols Date of Encounter: 06/27/2018  Primary Cardiologist: Elouise Munroe, MD   Subjective   Affect flat, reviewed cath with minimal disease.  No pain.  No SOB  Inpatient Medications    Scheduled Meds: . apixaban  10 mg Oral BID  . [START ON 07/04/2018] apixaban  5 mg Oral BID  . aspirin  81 mg Oral Daily  . atorvastatin  80 mg Oral q1800  . furosemide  20 mg Intravenous Daily  . sodium chloride flush  3 mL Intravenous Q12H   Continuous Infusions: . sodium chloride Stopped (06/26/18 2314)  . sodium chloride    . heparin 1,800 Units/hr (06/27/18 0547)   PRN Meds: sodium chloride, acetaminophen, diazepam, gabapentin, ondansetron (ZOFRAN) IV, ondansetron **OR** [DISCONTINUED] ondansetron (ZOFRAN) IV, oxyCODONE-acetaminophen **AND** oxyCODONE, sodium chloride flush   Vital Signs    Vitals:   06/27/18 0009 06/27/18 0018 06/27/18 0413 06/27/18 0600  BP: 133/85  (!) 121/100 114/79  Pulse: 87  74 72  Resp: 20  19 20   Temp: 98.2 F (36.8 C)  97.8 F (36.6 C)   TempSrc: Oral  Oral   SpO2: (!) 78% 91% 92% 91%  Weight:   78.9 kg   Height:        Intake/Output Summary (Last 24 hours) at 06/27/2018 0713 Last data filed at 06/27/2018 0656 Gross per 24 hour  Intake 1774.54 ml  Output 500 ml  Net 1274.54 ml   Filed Weights   06/25/18 0446 06/26/18 0522 06/27/18 0413  Weight: 80 kg 78.6 kg 78.9 kg    Telemetry    SR - Personally Reviewed  ECG    No new - Personally Reviewed  Physical Exam   GEN: No acute distress.   Neck: No JVD Cardiac: RRR, no murmurs, rubs, or gallops. Cath site Rt wrist without hematoma Respiratory: rales  to auscultation bilaterally. GI: Soft, nontender, non-distended  MS: No edema; No deformity. Neuro:  Nonfocal  Psych: Normal affect   Labs    Chemistry Recent Labs  Lab 06/24/18 2325 06/26/18 0449  NA 142 140  K 3.2* 3.5  CL 100 100  CO2 32 30  GLUCOSE 107* 110*  BUN 11 11    CREATININE 0.85 0.73  CALCIUM 8.4* 8.3*  PROT 7.0  --   ALBUMIN 2.9*  --   AST 100*  --   ALT 109*  --   ALKPHOS 28*  --   BILITOT 1.1  --   GFRNONAA >60 >60  GFRAA >60 >60  ANIONGAP 10 10     Hematology Recent Labs  Lab 06/24/18 2325 06/25/18 0426 06/26/18 0449  WBC 12.0* 11.9* 8.7  RBC 4.96 4.69 4.90  HGB 13.8 13.5 13.4  HCT 43.6 41.1 44.3  MCV 87.9 87.6 90.4  MCH 27.8 28.8 27.3  MCHC 31.7 32.8 30.2  RDW 13.5 13.5 13.7  PLT 300 277 275    Cardiac Enzymes Recent Labs  Lab 06/24/18 2325 06/25/18 0426 06/25/18 1332  TROPONINI 1.76* 1.83* 1.09*   No results for input(s): TROPIPOC in the last 168 hours.   BNP Recent Labs  Lab 06/25/18 1521  BNP 458.3*     DDimer No results for input(s): DDIMER in the last 168 hours.   Radiology    No results found.  Cardiac Studies   Cardiac cath 06/28/18   Ost 1st Diag lesion is 20% stenosed.  Prox LAD lesion is 10% stenosed.  Non-stenotic Prox LAD to Mid LAD  lesion.  LV end diastolic pressure is low.   Mild nonobstructive CAD with mild luminal irregularity of 10% in the proximal LAD with 20% proximal diagonal stenosis and mild systolic myocardial bridging in the mid LAD; normal left circumflex and normal RCA.  LVEDP 10 mmHg.  RECOMMENDATION: Medical therapy.  Recommend Aspirin 81mg  daily for moderate CAD.  Echo 06/24/18 ECHO:  06/24/2018 - Left ventricle: The cavity size was normal. Systolic function wasnormal. The estimated ejection fraction was in the range of 55%to 60%. Possible hypokinesis of the basal-midinferior andinferoseptal myocardium; consistent with ischemia or infarctionin the distribution of the right coronary artery. - Ventricular septum: The contour showed systolic flattening. Thesechanges are consistent with RV pressure overload. - Right ventricle: Akinesis of the RV apex, consistent with ischemia or infarction. There was a possible, medium-sized, 2.8cm (L) x 1.3 cm (W),  protruding, fixed thrombus at the apex,associated with an akinetic segment; based on its appearance,thrombus cannot be excluded.  Impressions:  - Right ventricular function is depressed due to regional apical dysfunction, with preserved annular excursion. This is the opposite of the expected pattern seen in acute pulmonary embolism. Consider primary right ventricular infarction. PossibleRV apical thrombus, which could be the source of pulmonaryembolism, especially if there is no evidence for lower extremityDVT.  Patient Profile     62 y.o. female w/ hx tob use was admitted from [facility] w/ elevated pro-BNP, elevated trop, CT +small bilateral PE, severe emphysema, ?RLL mass, ?infection/inflammation R lung. Had Lasix w/ good UOP, on hep gtt.   Assessment & Plan    Pulmonary embolism -on heparin change to eliquis? -neg DVT  -RV apical thrombus   Acute CHF -diastolic  Elevated troponin but cardiac cath with minimal nonobstructive disease.  -ASA 81 mg daily   Emphysema  - per IM   Possible lung cancer -per Pulmonary team  Per Dr. Nelda Marseille " I reviewed chest CT myself, small PE clot burden noted with GGO and a RLL lung mass noted.  Patient is currently on heparin.  Discussed with TRH-MD and PCCM-NP" -no bronch for now treat PE for 6-8 weeks then f/u with pulmonary     For questions or updates, please contact [facility] Please consult [email or web address] for contact info under      Signed, Cecilie Kicks, NP  06/27/2018, 7:13 AM   ---------------------------------------------------------   History and all data above reviewed.  Patient examined.  I agree with the findings as above.  Dawn Nichols is doing well today, eager to go home.   Constitutional: No acute distress ENMT: moist mucous membranes Cardiovascular: regular rhythm, normal rate, no murmurs. S1 and S2 normal. Radial pulses normal bilaterally. No jugular venous distention.  Respiratory: clear to  auscultation bilaterally GI : normal bowel sounds, soft and nontender. No distention.   MSK: extremities warm, well perfused. No edema.  NEURO: grossly nonfocal exam, moves all extremities. PSYCH: alert and oriented x 3, normal mood and affect.  All available labs, radiology testing, previous records reviewed. Agree with documented assessment and plan of my colleague as stated above with the following additions or changes:  Active Problems:   Pulmonary embolism (HCC)   Acute CHF (congestive heart failure) (HCC)   NSTEMI (non-ST elevated myocardial infarction) (HCC)   Emphysema lung (HCC)   Lung mass   Hypoxemia   Ground glass opacity present on imaging of lung   Thrombus of right ventricle without MI   Plan:    Acute CHF (congestive heart failure) ([place]) - can be dismissed  on lasix 20 oral daily, to be reassessed by PCP in 1 week, may not require ongoing diuretic therapy.     NSTEMI (non-ST elevated myocardial infarction) (HCC) - mild CAD, will continue ASA 81 mg daily.    Emphysema lung (HCC)   Lung mass   Hypoxemia   Ground glass opacity present on imaging of lung - requires pulmonary follow up    Pulmonary embolism (HCC)   Thrombus of right ventricle without MI - will transition from heparin to apixaban this evening. DOAC should be a reasonable choice in a patient with PE and RV thrombus. RV thrombus should be reassessed in 1 month with echo, and she will have cardiology follow up in my clinic at that time.   [facility] will sign off.   Medication Recommendations:  apixaban for treatment of RV thrombus and PE, lasix 20 daily for 1 week and reassess. Other recommendations (labs, testing, etc): BMET in 1 week with pcp follow up Follow up as an outpatient:  Echo and Appt with Dr. Margaretann Loveless in 1 month.  Elouise Munroe, MD [facility] 6:07 PM  06/27/2018

## 2018-06-27 NOTE — Plan of Care (Signed)
  Problem: Education: Goal: Knowledge of General Education information will improve Description: Including pain rating scale, medication(s)/side effects and non-pharmacologic comfort measures Outcome: Progressing   Problem: Activity: Goal: Risk for activity intolerance will decrease Outcome: Progressing   Problem: Nutrition: Goal: Adequate nutrition will be maintained Outcome: Progressing   Problem: Elimination: Goal: Will not experience complications related to urinary retention Outcome: Progressing   Problem: Pain Managment: Goal: General experience of comfort will improve Outcome: Progressing   Problem: Safety: Goal: Ability to remain free from injury will improve Outcome: Progressing   Problem: Skin Integrity: Goal: Risk for impaired skin integrity will decrease Outcome: Progressing   

## 2018-06-28 LAB — CBC
HEMATOCRIT: 46 % (ref 36.0–46.0)
HEMOGLOBIN: 13.9 g/dL (ref 12.0–15.0)
MCH: 27.4 pg (ref 26.0–34.0)
MCHC: 30.2 g/dL (ref 30.0–36.0)
MCV: 90.7 fL (ref 80.0–100.0)
Platelets: 290 10*3/uL (ref 150–400)
RBC: 5.07 MIL/uL (ref 3.87–5.11)
RDW: 13.7 % (ref 11.5–15.5)
WBC: 7.4 10*3/uL (ref 4.0–10.5)
nRBC: 0 % (ref 0.0–0.2)

## 2018-06-28 LAB — COMPREHENSIVE METABOLIC PANEL
ALK PHOS: 25 U/L — AB (ref 38–126)
ALT: 46 U/L — AB (ref 0–44)
AST: 32 U/L (ref 15–41)
Albumin: 2.6 g/dL — ABNORMAL LOW (ref 3.5–5.0)
Anion gap: 12 (ref 5–15)
BILIRUBIN TOTAL: 0.6 mg/dL (ref 0.3–1.2)
BUN: 11 mg/dL (ref 8–23)
CALCIUM: 8.5 mg/dL — AB (ref 8.9–10.3)
CO2: 26 mmol/L (ref 22–32)
Chloride: 100 mmol/L (ref 98–111)
Creatinine, Ser: 0.79 mg/dL (ref 0.44–1.00)
GFR calc non Af Amer: >60 mL/min (ref >60)
Glucose, Bld: 116 mg/dL — ABNORMAL HIGH (ref 70–99)
Potassium: 3 mmol/L — ABNORMAL LOW (ref 3.5–5.1)
SODIUM: 138 mmol/L (ref 135–145)
TOTAL PROTEIN: 6.1 g/dL — AB (ref 6.5–8.1)

## 2018-06-28 LAB — HYPERSENSITIVITY PNEUMONITIS
A. Pullulans Abs: NEGATIVE
A.Fumigatus #1 Abs: NEGATIVE
Micropolyspora faeni, IgG: NEGATIVE
Pigeon Serum Abs: NEGATIVE
Thermoact. Saccharii: NEGATIVE
Thermoactinomyces vulgaris, IgG: NEGATIVE

## 2018-06-28 MED ORDER — ATORVASTATIN CALCIUM 80 MG PO TABS: 80.0000 mg | ORAL_TABLET | Freq: Every day | ORAL | 3 refills | Status: DC

## 2018-06-28 MED ORDER — NICOTINE 21 MG/24HR TD PT24: 21.0000 mg | MEDICATED_PATCH | TRANSDERMAL | 1 refills | Status: DC

## 2018-06-28 MED ORDER — ASPIRIN 81 MG PO CHEW: 81.0000 mg | CHEWABLE_TABLET | Freq: Every day | ORAL | 3 refills | Status: DC

## 2018-06-28 MED ORDER — FUROSEMIDE 20 MG PO TABS: 20.0000 mg | ORAL_TABLET | Freq: Every day | ORAL | 0 refills | Status: DC

## 2018-06-28 MED ORDER — APIXABAN 5 MG PO TABS: ORAL_TABLET | ORAL | 3 refills | Status: DC

## 2018-06-28 MED ORDER — ALBUTEROL SULFATE HFA 108 (90 BASE) MCG/ACT IN AERS: 2.0000 | INHALATION_SPRAY | Freq: Four times a day (QID) | RESPIRATORY_TRACT | 2 refills | Status: AC | PRN

## 2018-06-28 NOTE — Progress Notes (Signed)
SATURATION QUALIFICATIONS: (This note is used to comply with regulatory documentation for home oxygen)  Patient Saturations on Room Air at Rest = 94%  Patient Saturations on Room Air while Ambulating = 86%  Patient Saturations on 2 Liters of oxygen while Ambulating = 92%  Please briefly explain why patient needs home oxygen: 

## 2018-06-28 NOTE — Progress Notes (Addendum)
Patient is for home oxygen, Dan with [employer]; he will bring a portable oxygen tank to the room for discharge and more oxygen tanks will be delivered to the home after dc; Mindi Slicker [facility] [phone]

## 2018-06-28 NOTE — Discharge Summary (Signed)
Physician Discharge Summary  Dawn Nichols HEN:[id number] DOB: 08-10-1956 DOA: 06/24/2018  PCP: Patient, No Pcp Per  Admit date: 06/24/2018  Discharge date: 06/28/2018  Admitted From: Home  Disposition:  Home  Discharge Condition:Stable  CODE STATUS:  Full  Diet recommendation: Heart Healthy  Brief/Interim Summary:  Dawn Nichols a 62 y.o.femalewithhistory of tobacco abuse and scoliosis with low back pain and peripheral neuropathy presented to the hospital with shortness of breath on exertion for 3 weeks prior to presentation.  In the ED at the outside hospital, she had a CT scan angiogram of the chest which was positive for bilateral pulmonary embolism and a finding with possible right hilar/lower lobe malignancy.  Troponin was also elevated with anterolateral T wave inversions.  Patient was then transferred to our hospital at [facility] for further evaluation and treatment..   After admission, following conditions were addressed ,  Bilateral pulmonary embolism with RV thrombus.    She was initially on heparin drip due to elevated troponins and the need for possible cardiac intervention.  After cardiac catheterization patient was changed to Eliquis which she has tolerated.  Lower extremity duplex ultrasound was negative.  Patient will need outpatient echocardiogram in 4 weeks to reevaluate for RV thrombus.  Patient was explained the risk versus benefits of anticoagulation including bleeding precautions while on it.  Non-ST elevation MI.  Patient had limited troponin chest pain and EKG changes.  Patient underwent cardiac catheterization on 06/26/2018 with findings of mild obstructive CAD.  Cardiology will order the patient.   Will consider aspirin low-dose on discharge occluding statins.  Patient will have a follow-up with cardiology scheduled on 07/30/2018.  Emphysema on the CT scan.  Patient is a chronic smoker.  Patient likely has COPD.   Patient desaturated during ambulation and  has qualified for home oxygen at 2 L/min.  She will also be considered albuterol as needed inhaler on discharge.  Follow-up with pulmonary as outpatient after discharge.  Right-sided lung mass, right hilar/lower lobe lesion around 2.4 cm. with mediastinal and hilar lymphadenopathy.  Was seen by pulmonary during hospitalization.  Recommended anticoagulation first and outpatient follow-up for consideration of biopsy. Patient will need to follow-up with pulmonary as outpatient  and the appointment has been set for 07/18/2018.  Acute diastolic heart failure with RV dysfunction.  This improved with IV diuretics in the hospital.  Cardiology has recommended 1 week of oral Lasix on discharge.  Patient will have to have BMP in 1 week and follow-up with her primary care physician as well.   Disposition.  At this time patient has been considered stable for disposition home.  I also spoke with the patient's family at bedside.  She will require oxygen on discharge.  Discharge Diagnoses:  Active Problems:   Pulmonary embolism (HCC)   Acute CHF (congestive heart failure) (HCC)   NSTEMI (non-ST elevated myocardial infarction) ([place])   Emphysema lung (HCC)   Lung mass   Hypoxemia   Ground glass opacity present on imaging of lung   Thrombus of right ventricle without MI   Discharge Instructions  Discharge Instructions    Diet general   Complete by:  As directed    Low salt   Increase activity slowly   Complete by:  As directed      Allergies as of 06/28/2018   No Known Allergies     Medication List    STOP taking these medications   amoxicillin 500 MG capsule Commonly known as:  AMOXIL  meloxicam 15 MG tablet Commonly known as:  MOBIC     TAKE these medications   albuterol 108 (90 Base) MCG/ACT inhaler Commonly known as:  PROVENTIL HFA;VENTOLIN HFA Inhale 2 puffs into the lungs every 6 (six) hours as needed for wheezing or shortness of breath.   apixaban 5 MG Tabs tablet Commonly known  as:  ELIQUIS 2 tab po bid x 7 days then 1 tab po bid x 3 months   aspirin 81 MG chewable tablet Chew 1 tablet (81 mg total) by mouth daily. Start taking on:  06/29/2018   atorvastatin 80 MG tablet Commonly known as:  LIPITOR Take 1 tablet (80 mg total) by mouth daily at 6 PM.   furosemide 20 MG tablet Commonly known as:  LASIX Take 1 tablet (20 mg total) by mouth daily for 7 days.   gabapentin 600 MG tablet Commonly known as:  NEURONTIN Take 600 mg by mouth 4 (four) times daily.   nicotine 21 mg/24hr patch Commonly known as:  NICODERM CQ - dosed in mg/24 hours Place 1 patch (21 mg total) onto the skin daily.   oxyCODONE-acetaminophen 10-325 MG tablet Commonly known as:  PERCOCET Take 1 tablet by mouth 3 (three) times daily as needed for pain. PRN up to three times daily            Durable Medical Equipment  (From admission, onward)         Start     Ordered   06/28/18 1346  For home use only DME oxygen  Once    Question Answer Comment  Mode or (Route) Nasal cannula   Liters per Minute 2   Frequency Continuous (stationary and portable oxygen unit needed)   Oxygen conserving device Yes   Oxygen delivery system Gas      06/28/18 1346         Follow-up Information    Lauraine Rinne, NP Follow up on 07/18/2018.   Specialty:  Pulmonary Disease Why:  Appointment is at 215 pm.  If you need to reschedule please call office.  Contact information: [address] [place] [address] [phone]        Elouise Munroe, MD Follow up.   Specialty:  Cardiology Why:  Cardiology hospital follow up on 07/30/18 at 9:40. Please arrive 15 minutes early for check in.  Contact information: [address] [place] [address] [facility] Follow up.   Specialty:  Cardiology Why:  Echocardiogram scheduled at our [facility] location on 07/29/18 at 9:30. Please arrive at 9:00.  Contact information: [address] [place] [phone]         No Known Allergies  Consultations:  Cardiology, pulmonary   Procedures/Studies: Cardiac catheterization 07/22/18  Subjective: Patient denies any chest pain shortness of breath cough fever.  She wishes to go home.  She was noted to be hypoxic on ambulation.  Discharge Exam: Vitals:   06/28/18 0351 06/28/18 1239  BP: 104/71 103/77  Pulse: 64 80  Resp: 16 18  Temp: 98 F (36.7 C) 98.1 F (36.7 C)  SpO2: 95% 92%   Vitals:   06/27/18 1809 06/27/18 2000 06/28/18 0351 06/28/18 1239  BP: 111/77 102/71 104/71 103/77  Pulse: 71 72 64 80  Resp:  (!) 25 16 18   Temp:  98.3 F (36.8 C) 98 F (36.7 C) 98.1 F (36.7 C)  TempSrc:  Oral Oral Oral  SpO2: 97% 94% 95% 92%  Weight:   79.8 kg   Height:       General: Pt is alert, awake, not in acute distress, on nasal cannula. Cardiovascular: RRR, S1/S2 +, no rubs, no gallops Respiratory: CTA bilaterally, no wheezing, no rhonchi.  Diminished breath sounds bilaterally Abdominal: Soft, NT, ND, bowel sounds + CNS: non focal. Extremities: no edema, no cyanosis  The results of significant diagnostics from this hospitalization (including imaging, microbiology, ancillary and laboratory) are listed below for reference.    Microbiology: Recent Results (from the past 240 hour(s))  Respiratory Panel by PCR     Status: None   Collection Time: 06/25/18  2:42 PM  Result Value Ref Range Status   Adenovirus NOT DETECTED NOT DETECTED Final   Coronavirus 229E NOT DETECTED NOT DETECTED Final   Coronavirus HKU1 NOT DETECTED NOT DETECTED Final   Coronavirus NL63 NOT DETECTED NOT DETECTED Final   Coronavirus OC43 NOT DETECTED NOT DETECTED Final   Metapneumovirus NOT DETECTED NOT DETECTED Final   Rhinovirus / Enterovirus NOT DETECTED NOT DETECTED Final   Influenza A NOT DETECTED NOT DETECTED Final   Influenza A H1 NOT DETECTED NOT DETECTED Final   Influenza A H1  2009 NOT DETECTED NOT DETECTED Final   Influenza A H3 NOT DETECTED NOT DETECTED Final   Influenza B NOT DETECTED NOT DETECTED Final   Parainfluenza Virus 1 NOT DETECTED NOT DETECTED Final   Parainfluenza Virus 2 NOT DETECTED NOT DETECTED Final   Parainfluenza Virus 3 NOT DETECTED NOT DETECTED Final   Parainfluenza Virus 4 NOT DETECTED NOT DETECTED Final   Respiratory Syncytial Virus NOT DETECTED NOT DETECTED Final   Bordetella pertussis NOT DETECTED NOT DETECTED Final   Chlamydophila pneumoniae NOT DETECTED NOT DETECTED Final   Mycoplasma pneumoniae NOT DETECTED NOT DETECTED Final    Comment: Performed at [facility], [place] [address], [place], [place] [address]     Labs: BNP (last 3 results) Recent Labs    06/25/18 1521  BNP 854.6*   Basic Metabolic Panel: Recent Labs  Lab 06/24/18 2325 06/25/18 0426 06/26/18 0449 06/27/18 0547 06/28/18 0428  NA 142  --  140 139 138  K 3.2*  --  3.5 3.3* 3.0*  CL 100  --  100 102 100  CO2 32  --  30 26 26   GLUCOSE 107*  --  110* 93 116*  BUN 11  --  11 11 11   CREATININE 0.85  --  0.73 0.75 0.79  CALCIUM 8.4*  --  8.3* 8.0* 8.5*  MG  --  1.9 2.2 2.1  --    Liver Function Tests: Recent Labs  Lab 06/24/18 2325 06/28/18 0428  AST 100* 32  ALT 109* 46*  ALKPHOS 28* 25*  BILITOT 1.1 0.6  PROT 7.0 6.1*  ALBUMIN 2.9* 2.6*   No results for input(s): LIPASE, AMYLASE in the last 168 hours. No results for input(s): AMMONIA in the last 168 hours. CBC: Recent Labs  Lab 06/24/18 2325 06/25/18 0426 06/26/18 0449 06/27/18 0547 06/28/18 0428  WBC 12.0* 11.9* 8.7 8.1 7.4  NEUTROABS 8.6*  --   --   --   --   HGB 13.8 13.5 13.4 13.9 13.9  HCT 43.6 41.1 44.3 46.9* 46.0  MCV 87.9 87.6 90.4 92.0 90.7  PLT 300 277 275 307 290   Cardiac Enzymes: Recent Labs  Lab 06/24/18 2325 06/25/18 0426 06/25/18 1332  TROPONINI 1.76* 1.83* 1.09*  BNP: Invalid input(s): POCBNP CBG: No results for input(s): GLUCAP in the last 168  hours. D-Dimer No results for input(s): DDIMER in the last 72 hours. Hgb A1c No results for input(s): HGBA1C in the last 72 hours. Lipid Profile No results for input(s): CHOL, HDL, LDLCALC, TRIG, CHOLHDL, LDLDIRECT in the last 72 hours. Thyroid function studies No results for input(s): TSH, T4TOTAL, T3FREE, THYROIDAB in the last 72 hours.  Invalid input(s): FREET3 Anemia work up No results for input(s): VITAMINB12, FOLATE, FERRITIN, TIBC, IRON, RETICCTPCT in the last 72 hours. Urinalysis    Component Value Date/Time   COLORURINE AMBER (A) 06/25/2018 0409   APPEARANCEUR CLEAR 06/25/2018 0409   LABSPEC 1.039 (H) 06/25/2018 0409   PHURINE 6.0 06/25/2018 0409   GLUCOSEU NEGATIVE 06/25/2018 0409   HGBUR NEGATIVE 06/25/2018 0409   BILIRUBINUR NEGATIVE 06/25/2018 0409   KETONESUR NEGATIVE 06/25/2018 0409   PROTEINUR 30 (A) 06/25/2018 0409   NITRITE NEGATIVE 06/25/2018 0409   LEUKOCYTESUR NEGATIVE 06/25/2018 0409   Sepsis Labs Invalid input(s): PROCALCITONIN,  WBC,  LACTICIDVEN Microbiology Recent Results (from the past 240 hour(s))  Respiratory Panel by PCR     Status: None   Collection Time: 06/25/18  2:42 PM  Result Value Ref Range Status   Adenovirus NOT DETECTED NOT DETECTED Final   Coronavirus 229E NOT DETECTED NOT DETECTED Final   Coronavirus HKU1 NOT DETECTED NOT DETECTED Final   Coronavirus NL63 NOT DETECTED NOT DETECTED Final   Coronavirus OC43 NOT DETECTED NOT DETECTED Final   Metapneumovirus NOT DETECTED NOT DETECTED Final   Rhinovirus / Enterovirus NOT DETECTED NOT DETECTED Final   Influenza A NOT DETECTED NOT DETECTED Final   Influenza A H1 NOT DETECTED NOT DETECTED Final   Influenza A H1 2009 NOT DETECTED NOT DETECTED Final   Influenza A H3 NOT DETECTED NOT DETECTED Final   Influenza B NOT DETECTED NOT DETECTED Final   Parainfluenza Virus 1 NOT DETECTED NOT DETECTED Final   Parainfluenza Virus 2 NOT DETECTED NOT DETECTED Final   Parainfluenza Virus 3 NOT  DETECTED NOT DETECTED Final   Parainfluenza Virus 4 NOT DETECTED NOT DETECTED Final   Respiratory Syncytial Virus NOT DETECTED NOT DETECTED Final   Bordetella pertussis NOT DETECTED NOT DETECTED Final   Chlamydophila pneumoniae NOT DETECTED NOT DETECTED Final   Mycoplasma pneumoniae NOT DETECTED NOT DETECTED Final    Comment: Performed at [facility], [place] [address], [place], [place] [address]    Please note: You were cared for by a hospitalist during your hospital stay. Once you are discharged, your primary care physician will handle any further medical issues.  Please follow-up with cardiology and pulmonary as has been scheduled.  Time coordinating discharge: 40 minutes  SIGNED:  Flora Lipps, MD   [facility] 06/28/2018, 1:53 PM

## 2018-07-18 ENCOUNTER — Ambulatory Visit (INDEPENDENT_AMBULATORY_CARE_PROVIDER_SITE_OTHER): Payer: 59 | Admitting: Pulmonary Disease

## 2018-07-18 ENCOUNTER — Encounter: Payer: Self-pay | Admitting: Pulmonary Disease

## 2018-07-18 ENCOUNTER — Other Ambulatory Visit (INDEPENDENT_AMBULATORY_CARE_PROVIDER_SITE_OTHER): Payer: 59

## 2018-07-18 VITALS — BP 108/76 | HR 98 | Ht 61.5 in | Wt 175.2 lb

## 2018-07-18 DIAGNOSIS — Z23 Encounter for immunization: Secondary | ICD-10-CM

## 2018-07-18 DIAGNOSIS — I5031 Acute diastolic (congestive) heart failure: Secondary | ICD-10-CM

## 2018-07-18 DIAGNOSIS — J9611 Chronic respiratory failure with hypoxia: Secondary | ICD-10-CM | POA: Insufficient documentation

## 2018-07-18 DIAGNOSIS — Z0000 Encounter for general adult medical examination without abnormal findings: Secondary | ICD-10-CM

## 2018-07-18 DIAGNOSIS — R911 Solitary pulmonary nodule: Secondary | ICD-10-CM | POA: Diagnosis not present

## 2018-07-18 DIAGNOSIS — F17210 Nicotine dependence, cigarettes, uncomplicated: Secondary | ICD-10-CM

## 2018-07-18 DIAGNOSIS — R918 Other nonspecific abnormal finding of lung field: Secondary | ICD-10-CM

## 2018-07-18 DIAGNOSIS — R0602 Shortness of breath: Secondary | ICD-10-CM

## 2018-07-18 DIAGNOSIS — Z7189 Other specified counseling: Secondary | ICD-10-CM | POA: Insufficient documentation

## 2018-07-18 DIAGNOSIS — J432 Centrilobular emphysema: Secondary | ICD-10-CM

## 2018-07-18 DIAGNOSIS — I2699 Other pulmonary embolism without acute cor pulmonale: Secondary | ICD-10-CM

## 2018-07-18 DIAGNOSIS — Z720 Tobacco use: Secondary | ICD-10-CM | POA: Insufficient documentation

## 2018-07-18 LAB — BASIC METABOLIC PANEL
BUN: 7 mg/dL (ref 6–23)
CALCIUM: 9.3 mg/dL (ref 8.4–10.5)
CHLORIDE: 102 meq/L (ref 96–112)
CO2: 30 mEq/L (ref 19–32)
CREATININE: 0.71 mg/dL (ref 0.40–1.20)
GFR: 88.91 mL/min (ref >60.00)
Glucose, Bld: 104 mg/dL — ABNORMAL HIGH (ref 70–99)
Potassium: 4.7 mEq/L (ref 3.5–5.1)
SODIUM: 137 meq/L (ref 135–145)

## 2018-07-18 MED ORDER — UMECLIDINIUM-VILANTEROL 62.5-25 MCG/INH IN AEPB: 1.0000 | INHALATION_SPRAY | Freq: Every day | RESPIRATORY_TRACT | 0 refills | Status: DC

## 2018-07-18 NOTE — Assessment & Plan Note (Addendum)
  Walk today for POC >>> 4 L pulsed  Continue to wear oxygen 2 L with exertion to maintain oxygen saturations greater than 88 to 90%   We will order pulmonary function testing free to be completed in November/2019 >>>you will follow-up with Dr. Valeta Harms after that appointment >>>Follow up with Dr. Valeta Harms - 17min appt

## 2018-07-18 NOTE — Patient Instructions (Addendum)
Labwork today   PCP follow up  - 08/05/18  >>> needs management of Edema   Keep follow up with Cardiology  >>>Echo on 07/29/18 >>>appt with Cardiology   Walk today for POC  Continue to wear oxygen 2 L with exertion to maintain oxygen saturations greater than 88 to 90%  We will order pulmonary function testing free to be completed in November/2019 >>>you will follow-up with Dr. Valeta Harms after that appointment >>>Follow up with Dr. Valeta Harms - 73min appt    We will repeat CT without contrast in 6 to 8 weeks.   Note your daily symptoms > remember "red flags" for COPD:   >>>Increase in cough >>>increase in sputum production >>>increase in shortness of breath or activity  intolerance.   If you notice these symptoms, please call the office to be seen.   Will start:   Anoro Ellipta  >>> Take 1 puff daily in the morning right when you wake up >>>Rinse your mouth out after use >>>This is a daily maintenance inhaler, NOT a rescue inhaler >>>Contact our office if you are having difficulties affording or obtaining this medication >>>It is important for you to be able to take this daily and not miss any doses    Pneumovax 23 today  Review acid reflux information listed below  We recommend that you stop smoking.  >>>You need to set a quit date >>>If you have friends or family who smoke, let them know you are trying to quit and not to smoke around you or in your living environment  Smoking Cessation Resources:  1 800 QUIT NOW  >>> Patient to call this resource and utilize it to help support her quit smoking >>> Keep up your hard work with stopping smoking  You can also contact the [facility] >>>For smoking cessation classes call [phone]  We do not recommend using e-cigarettes as a form of stopping smoking  You can sign up for smoking cessation support texts and information:  >>>https://smokefree.gov/smokefreetxt   Nicotine patches: >>>Make sure you rotate  sites that you do not get skin irritation, Apply 1 patch each morning to a non-hairy skin site  If you are smoking greater than 10 cigarettes/day and weigh over 45 kg start with the nicotine patch of 21 mg a day for 6 weeks, then 14 mg a day for 2 weeks, then finished with 7 mg a day for 2 weeks, then stop  >>>If insomnia occurs you are having trouble sleeping you can take the patch off at night, and place a new one on in the morning >>>If the patch is removed at night and you have morning cravings start short acting nicotine replacement therapy such as gum or lozenges  Nicotine lozenge: Lozenges are commonly uses short acting NRT product  >>>Smokers who smoke within 30 minutes of awakening should use 4 mg dose >>>Smokers who wait more than 30 minutes after awakening to smoke should use 2 mg dose  Can use up to 1 lozenge every 1-2 hours for 6 weeks >>>Total amount of lozenges that can be used per day as 20 >>>Gradually reduce number of lozenges used per day after 2 weeks of use  Place lozenge in mouth and allowed to dissolve for 30 minutes loss and does not need to be chewed  Lozenges have advantages to be able to be used in people with TMG, poor dentition, dentures  >>>Avoid acidic beverages such as coffee, carbonated beverages before and during gum use.  A soft acidic beverages  lower oral pH which cause nicotine to not be absorbed properly >>>If you chew the gum too quickly or vigorously you could have nausea, vomiting, abdominal pain, constipation, hiccups, headache, sore jaw, mouth irritation ulcers       November/2019 we will be moving! We will no longer be at our [place] location.  Be on the look out for a post card/mailer to let you know we have officially moved.  Our new address and phone number will be:  [address] [place], [place] [address] Telephone number: [phone]  It is flu season:   >>>Remember to be washing your hands regularly, using hand sanitizer,  be careful to use around herself with has contact with people who are sick will increase her chances of getting sick yourself. >>> Best ways to protect herself from the flu: Receive the yearly flu vaccine, practice good hand hygiene washing with soap and also using hand sanitizer when available, eat a nutritious meals, get adequate rest, hydrate appropriately   Please contact the office if your symptoms worsen or you have concerns that you are not improving.   Thank you for choosing [facility] for your healthcare, and for allowing [place] to partner with you on your healthcare journey. I am thankful to be able to provide care to you today.   Wyn Quaker FNP-C     Gastroesophageal Reflux Disease, Adult Normally, food travels down the esophagus and stays in the stomach to be digested. If a person has gastroesophageal reflux disease (GERD), food and stomach acid move back up into the esophagus. When this happens, the esophagus becomes sore and swollen (inflamed). Over time, GERD can make small holes (ulcers) in the lining of the esophagus. Follow these instructions at home: Diet  Follow a diet as told by your doctor. You may need to avoid foods and drinks such as: ? Coffee and tea (with or without caffeine). ? Drinks that contain alcohol. ? Energy drinks and sports drinks. ? Carbonated drinks or sodas. ? Chocolate and cocoa. ? Peppermint and mint flavorings. ? Garlic and onions. ? Horseradish. ? Spicy and acidic foods, such as peppers, chili powder, curry powder, vinegar, hot sauces, and BBQ sauce. ? Citrus fruit juices and citrus fruits, such as oranges, lemons, and limes. ? Tomato-based foods, such as red sauce, chili, salsa, and pizza with red sauce. ? Fried and fatty foods, such as donuts, french fries, potato chips, and high-fat dressings. ? High-fat meats, such as hot dogs, rib eye steak, sausage, ham, and bacon. ? High-fat dairy items, such as whole milk, butter, and cream  cheese.  Eat small meals often. Avoid eating large meals.  Avoid drinking large amounts of liquid with your meals.  Avoid eating meals during the 2-3 hours before bedtime.  Avoid lying down right after you eat.  Do not exercise right after you eat. General instructions  Pay attention to any changes in your symptoms.  Take over-the-counter and prescription medicines only as told by your doctor. Do not take aspirin, ibuprofen, or other NSAIDs unless your doctor says it is okay.  Do not use any tobacco products, including cigarettes, chewing tobacco, and e-cigarettes. If you need help quitting, ask your doctor.  Wear loose clothes. Do not wear anything tight around your waist.  Raise (elevate) the head of your bed about 6 inches (15 cm).  Try to lower your stress. If you need help doing this, ask your doctor.  If you are overweight, lose an amount of  weight that is healthy for you. Ask your doctor about a safe weight loss goal.  Keep all follow-up visits as told by your doctor. This is important. Contact a doctor if:  You have new symptoms.  You lose weight and you do not know why it is happening.  You have trouble swallowing, or it hurts to swallow.  You have wheezing or a cough that keeps happening.  Your symptoms do not get better with treatment.  You have a hoarse voice. Get help right away if:  You have pain in your arms, neck, jaw, teeth, or back.  You feel sweaty, dizzy, or light-headed.  You have chest pain or shortness of breath.  You throw up (vomit) and your throw up looks like blood or coffee grounds.  You pass out (faint).  Your poop (stool) is bloody or black.  You cannot swallow, drink, or eat. This information is not intended to replace advice given to you by your health care provider. Make sure you discuss any questions you have with your health care provider. Document Released: 02/14/2008 Document Revised: 02/03/2016 Document Reviewed:  12/23/2014 Elsevier Interactive Patient Education  2018 [employer] for Gastroesophageal Reflux Disease, Adult When you have gastroesophageal reflux disease (GERD), the foods you eat and your eating habits are very important. Choosing the right foods can help ease your discomfort. What guidelines do I need to follow?  Choose fruits, vegetables, whole grains, and low-fat dairy products.  Choose low-fat meat, fish, and poultry.  Limit fats such as oils, salad dressings, butter, nuts, and avocado.  Keep a food diary. This helps you identify foods that cause symptoms.  Avoid foods that cause symptoms. These may be different for everyone.  Eat small meals often instead of 3 large meals a day.  Eat your meals slowly, in a place where you are relaxed.  Limit fried foods.  Cook foods using methods other than frying.  Avoid drinking alcohol.  Avoid drinking large amounts of liquids with your meals.  Avoid bending over or lying down until 2-3 hours after eating. What foods are not recommended? These are some foods and drinks that may make your symptoms worse: Vegetables Tomatoes. Tomato juice. Tomato and spaghetti sauce. Chili peppers. Onion and garlic. Horseradish. Fruits Oranges, grapefruit, and lemon (fruit and juice). Meats High-fat meats, fish, and poultry. This includes hot dogs, ribs, ham, sausage, salami, and bacon. Dairy Whole milk and chocolate milk. Sour cream. Cream. Butter. Ice cream. Cream cheese. Drinks Coffee and tea. Bubbly (carbonated) drinks or energy drinks. Condiments Hot sauce. Barbecue sauce. Sweets/Desserts Chocolate and cocoa. Donuts. Peppermint and spearmint. Fats and Oils High-fat foods. This includes [place] fries and potato chips. Other Vinegar. Strong spices. This includes black pepper, white pepper, red pepper, cayenne, curry powder, cloves, ginger, and chili powder. The items listed above may not be a complete list of foods  and drinks to avoid. Contact your dietitian for more information. This information is not intended to replace advice given to you by your health care provider. Make sure you discuss any questions you have with your health care provider. Document Released: 02/27/2012 Document Revised: 02/03/2016 Document Reviewed: 07/02/2013 Elsevier Interactive Patient Education  2017 [employer] with Quitting Smoking Quitting smoking is a physical and mental challenge. You will face cravings, withdrawal symptoms, and temptation. Before quitting, work with your health care provider to make a plan that can help you cope. Preparation can help you quit and keep  you from giving in. How can I cope with cravings? Cravings usually last for 5-10 minutes. If you get through it, the craving will pass. Consider taking the following actions to help you cope with cravings:  Keep your mouth busy: ? Chew sugar-free gum. ? Suck on hard candies or a straw. ? Brush your teeth.  Keep your hands and body busy: ? Immediately change to a different activity when you feel a craving. ? Squeeze or play with a ball. ? Do an activity or a hobby, like making bead jewelry, practicing needlepoint, or working with wood. ? Mix up your normal routine. ? Take a short exercise break. Go for a quick walk or run up and down stairs. ? Spend time in public places where smoking is not allowed.  Focus on doing something kind or helpful for someone else.  Call a friend or family member to talk during a craving.  Join a support group.  Call a quit line, such as 1-800-QUIT-NOW.  Talk with your health care provider about medicines that might help you cope with cravings and make quitting easier for you.  How can I deal with withdrawal symptoms? Your body may experience negative effects as it tries to get used to not having nicotine in the system. These effects are called withdrawal symptoms. They may include:  Feeling hungrier  than normal.  Trouble concentrating.  Irritability.  Trouble sleeping.  Feeling depressed.  Restlessness and agitation.  Craving a cigarette.  To manage withdrawal symptoms:  Avoid places, people, and activities that trigger your cravings.  Remember why you want to quit.  Get plenty of sleep.  Avoid coffee and other caffeinated drinks. These may worsen some of your symptoms.  How can I handle social situations? Social situations can be difficult when you are quitting smoking, especially in the first few weeks. To manage this, you can:  Avoid parties, bars, and other social situations where people might be smoking.  Avoid alcohol.  Leave right away if you have the urge to smoke.  Explain to your family and friends that you are quitting smoking. Ask for understanding and support.  Plan activities with friends or family where smoking is not an option.  What are some ways I can cope with stress? Wanting to smoke may cause stress, and stress can make you want to smoke. Find ways to manage your stress. Relaxation techniques can help. For example:  Breathe slowly and deeply, in through your nose and out through your mouth.  Listen to soothing, relaxing music.  Talk with a family member or friend about your stress.  Light a candle.  Soak in a bath or take a shower.  Think about a peaceful place.  What are some ways I can prevent weight gain? Be aware that many people gain weight after they quit smoking. However, not everyone does. To keep from gaining weight, have a plan in place before you quit and stick to the plan after you quit. Your plan should include:  Having healthy snacks. When you have a craving, it may help to: ? Eat plain popcorn, crunchy carrots, celery, or other cut vegetables. ? Chew sugar-free gum.  Changing how you eat: ? Eat small portion sizes at meals. ? Eat 4-6 small meals throughout the day instead of 1-2 large meals a day. ? Be mindful when  you eat. Do not watch television or do other things that might distract you as you eat.  Exercising regularly: ? Make time to exercise  each day. If you do not have time for a long workout, do short bouts of exercise for 5-10 minutes several times a day. ? Do some form of strengthening exercise, like weight lifting, and some form of aerobic exercise, like running or swimming.  Drinking plenty of water or other low-calorie or no-calorie drinks. Drink 6-8 glasses of water daily, or as much as instructed by your health care provider.  Summary  Quitting smoking is a physical and mental challenge. You will face cravings, withdrawal symptoms, and temptation to smoke again. Preparation can help you as you go through these challenges.  You can cope with cravings by keeping your mouth busy (such as by chewing gum), keeping your body and hands busy, and making calls to family, friends, or a helpline for people who want to quit smoking.  You can cope with withdrawal symptoms by avoiding places where people smoke, avoiding drinks with caffeine, and getting plenty of rest.  Ask your health care provider about the different ways to prevent weight gain, avoid stress, and handle social situations. This information is not intended to replace advice given to you by your health care provider. Make sure you discuss any questions you have with your health care provider. Document Released: 08/25/2016 Document Revised: 08/25/2016 Document Reviewed: 08/25/2016 Elsevier Interactive Patient Education  [employer].

## 2018-07-18 NOTE — Progress Notes (Signed)
Thanks. I will be glad to see her to discuss bronchoscopy. Garner Nash, DO [facility] 07/18/2018 4:50 PM

## 2018-07-18 NOTE — Assessment & Plan Note (Signed)
Labwork today   PCP follow up  - 08/05/18  >>> needs management of Edema   Keep follow up with Cardiology  >>>Echo on 07/29/18 >>>appt with Cardiology   Stop smoking Pneumovax 23 today

## 2018-07-18 NOTE — Assessment & Plan Note (Signed)
  Walk today for POC  Continue to wear oxygen 2 L with exertion to maintain oxygen saturations greater than 88 to 90%  We will order pulmonary function testing free to be completed in November/2019 >>>you will follow-up with Dr. Valeta Harms after that appointment >>>Follow up with Dr. Valeta Harms - 52min appt    We will repeat CT without contrast in 6 to 8 weeks.  Note your daily symptoms > remember "red flags" for COPD:   >>>Increase in cough >>>increase in sputum production >>>increase in shortness of breath or activity  intolerance.   If you notice these symptoms, please call the office to be seen.   Will start:   Anoro Ellipta  >>> Take 1 puff daily in the morning right when you wake up >>>Rinse your mouth out after use >>>This is a daily maintenance inhaler, NOT a rescue inhaler >>>Contact our office if you are having difficulties affording or obtaining this medication >>>It is important for you to be able to take this daily and not miss any doses   Pneumovax 23 today  We recommend that you stop smoking.  >>>You need to set a quit date >>>If you have friends or family who smoke, let them know you are trying to quit and not to smoke around you or in your living environment  Smoking Cessation Resources:  1 800 QUIT NOW  >>> Patient to call this resource and utilize it to help support her quit smoking >>> Keep up your hard work with stopping smoking  You can also contact the [facility] >>>For smoking cessation classes call [phone]  We do not recommend using e-cigarettes as a form of stopping smoking  You can sign up for smoking cessation support texts and information:  >>>https://smokefree.gov/smokefreetxt

## 2018-07-18 NOTE — Assessment & Plan Note (Addendum)
Labwork today  >>>BMET  PCP follow up  - 08/05/18  >>> needs management of Edema   Keep follow up with Cardiology  >>>Echo on 07/29/18 >>>appt with Cardiology   Purchase lower extremity support hose to help with lower extremity swelling Elevate legs when able Weigh yourself daily in the morning Record those weights Follow low-sodium diet

## 2018-07-18 NOTE — Assessment & Plan Note (Signed)
-   Continue Eliquis 

## 2018-07-18 NOTE — Progress Notes (Signed)
@Patient  ID: Dawn Nichols, female    DOB: 10-27-1955, 62 y.o.   MRN: [id number]  Chief Complaint  Patient presents with  . Hospitalization Follow-up    lung mass, SOB     Referring provider: No ref. provider found  HPI:  62 year old female patient initially consulted with our team on 06/25/2018 during inpatient admission for bilateral PEs.  Patient found to have right lower lobe mass on CT Angio.  Patient also found to have severe emphysema.  Probable and suspected COPD.  PMH: Peripheral neuropathy, scoliosis Smoker/ Smoking History: Current every day smoker. 45 pack year smoker.  Maintenance:  none Pt of: Icard  Recent [place] [facility] Encounters:   06/25/2018-consult-[place] 62 year old female with 3-week history of progressive shortness of breath found to have small bilateral PEs in her right lower lobe mass.  Additionally found to have elevated troponin and BNP with EKG changes.  Cardiology was consulted for possible non-STEMI. Plan:  Repeat imaging in 6 to 8 weeks, PFTs  Discharge date 06/28/2018- follow-up with PCP >>>BMP in 1 week  07/18/2018  - Visit   62 year old female patient following up for hospital follow-up.  Patient was recently seen in the hospital suspected to have COPD needs pulmonary function testing.  CT angio showing bilateral small PEs that were unprovoked patient is now on Eliquis.  Patient CT Angio also showed severe emphysema.  Patient reports she received her flu vaccine after getting discharged from the hospital.  Patient has not followed up with primary care yet patient is called me and appointment and will see primary care later on in November.  Patient also has follow-up with cardiology in November as well as a echocardiogram.  CT Angio also revealed lung mass that will probably need biopsy in the future.  Patient denies fever, chills, hemoptysis, worsening shortness of breath, fatigue, nausea vomiting.  Patient does report chronic fatigue as well as  chronic shortness of breath which she has that with for many years.  Patient denies cough with sputum production at this time.   Tests:  06/25/2018-ANA-negative 06/25/2018-ACE-28 06/25/2018-anti-DNA antibody-2 06/25/2018-C-reactive protein 6.4 06/25/2018- rheumatoid factor negative 06/25/2018-sed rate-30 06/25/2018-BNP-458  10/14 CTA PE >> Small bilateral PE; severe emphysema w/ extensive septal thickening and concern for new parenchymal disease in the right lung which could represent an infectious or inflammatory process.  Emphysema.  Mediastinal and hilar lymphadenopathy.  Concern for RLL or right hilar lesion measuring 2.4 cm.   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  Imaging: Vas [place] Lower Extremity Venous (dvt)  Result Date: 06/25/2018  Lower Venous Study Indications: Pulmonary embolism.  Performing Technologist: Oliver Hum RVT  Examination Guidelines: A complete evaluation includes B-mode imaging, spectral Doppler, color Doppler, and power Doppler as needed of all accessible portions of each vessel. Bilateral testing is considered an integral part of a complete examination. Limited examinations for reoccurring indications may be performed as noted.  Right Venous Findings: +---------+---------------+---------+-----------+----------+-------+          CompressibilityPhasicitySpontaneityPropertiesSummary +---------+---------------+---------+-----------+----------+-------+ CFV      Full  Yes      Yes                          +---------+---------------+---------+-----------+----------+-------+ SFJ      Full                                                 +---------+---------------+---------+-----------+----------+-------+ FV Prox  Full                                                 +---------+---------------+---------+-----------+----------+-------+ FV Mid   Full                                                 +---------+---------------+---------+-----------+----------+-------+ FV DistalFull                                                 +---------+---------------+---------+-----------+----------+-------+ PFV      Full                                                 +---------+---------------+---------+-----------+----------+-------+ POP      Full           Yes      Yes                          +---------+---------------+---------+-----------+----------+-------+ PTV      Full                                                 +---------+---------------+---------+-----------+----------+-------+ PERO     Full                                                 +---------+---------------+---------+-----------+----------+-------+  Left Venous Findings: +---------+---------------+---------+-----------+----------+-------+          CompressibilityPhasicitySpontaneityPropertiesSummary +---------+---------------+---------+-----------+----------+-------+ CFV      Full           Yes      Yes                          +---------+---------------+---------+-----------+----------+-------+ SFJ      Full                                                 +---------+---------------+---------+-----------+----------+-------+ FV Prox  Full                                                 +---------+---------------+---------+-----------+----------+-------+  FV Mid   Full                                                  +---------+---------------+---------+-----------+----------+-------+ FV DistalFull                                                 +---------+---------------+---------+-----------+----------+-------+ PFV      Full                                                 +---------+---------------+---------+-----------+----------+-------+ POP      Full           Yes      Yes                          +---------+---------------+---------+-----------+----------+-------+ PTV      Full                                                 +---------+---------------+---------+-----------+----------+-------+ PERO     Full                                                 +---------+---------------+---------+-----------+----------+-------+    Summary: Right: There is no evidence of deep vein thrombosis in the lower extremity. No cystic structure found in the popliteal fossa. Left: There is no evidence of deep vein thrombosis in the lower extremity. No cystic structure found in the popliteal fossa.  *See table(s) above for measurements and observations. Electronically signed by Deitra Mayo MD on 06/25/2018 at 4:46:15 PM.    Final     Chart Review:    Specialty Problems      Pulmonary Problems   Emphysema lung ([place])    10/14 CTA PE >> Small bilateral PE; severe emphysema w/ extensive septal thickening and concern for new parenchymal disease in the right lung which could represent an infectious or inflammatory process.  Emphysema.  Mediastinal and hilar lymphadenopathy.  Concern for RLL or right hilar lesion measuring 2.4 cm.   Follow-up CT ordered PFTs ordered       Lung mass    10/14 CTA PE >> Small bilateral PE; severe emphysema w/ extensive septal thickening and concern for new parenchymal disease in the right lung which could represent an infectious or inflammatory process.  Emphysema.  Mediastinal and hilar lymphadenopathy.  Concern for RLL or right hilar lesion measuring 2.4  cm.   Follow-up CT ordered PFTs ordered       Chronic respiratory failure with hypoxia (H[facility])      No Known Allergies  Immunization History  Administered Date(s) Administered  . Influenza Whole 06/24/2018  . Pneumococcal Polysaccharide-23 07/18/2018    >>>flu shot already this season  >>>pneumonia shot today   Past Medical History:  Diagnosis Date  .  Tobacco use     Tobacco History: Social History   Tobacco Use  Smoking Status Current Every Day Smoker  . Packs/day: 1.00  . Years: 45.00  . Pack years: 45.00  . Types: Cigarettes  Smokeless Tobacco Never Used   Ready to quit: Yes Counseling given: Yes  Smoking assessment and cessation counseling  Patient currently smoking: 1 pack/day I have advised the patient to quit/stop smoking as soon as possible due to high risk for multiple medical problems.  It will also be very difficult for [place] to manage patient's  respiratory symptoms and status if we continue to expose her lungs to a known irritant.  We do not advise e-cigarettes as a form of stopping smoking.  Patient is willing to quit smoking.  Patient has not set quit date yet.  I have advised the patient that we can assist and have options of nicotine replacement therapy, provided smoking cessation education today, provided smoking cessation counseling, and provided cessation resources.  Patient reports she has tried nicotine patches before but has not tried gum or lozenges.  Patient is willing to try nicotine patches and lozenges.  Patient has not tried Chantix or Wellbutrin but she had a family member who used Wellbutrin and did not "react well to it".  Patient is open to trying Chantix if she continues to struggle to stop smoking.   Follow-up next office visit office visit for assessment of smoking cessation.  Smoking cessation counseling advised for: 12 min   Outpatient Encounter Medications as of 07/18/2018  Medication Sig  . albuterol (PROVENTIL HFA;VENTOLIN  HFA) 108 (90 Base) MCG/ACT inhaler Inhale 2 puffs into the lungs every 6 (six) hours as needed for wheezing or shortness of breath.  [name] apixaban (ELIQUIS) 5 MG TABS tablet 2 tab po bid x 7 days then 1 tab po bid x 3 months  . aspirin 81 MG chewable tablet Chew 1 tablet (81 mg total) by mouth daily.  [name] atorvastatin (LIPITOR) 80 MG tablet Take 1 tablet (80 mg total) by mouth daily at 6 PM.  . gabapentin (NEURONTIN) 600 MG tablet Take 600 mg by mouth 4 (four) times daily.   [name] oxyCODONE-acetaminophen (PERCOCET) 10-325 MG per tablet Take 1 tablet by mouth 3 (three) times daily as needed for pain. PRN up to three times daily  . furosemide (LASIX) 20 MG tablet Take 1 tablet (20 mg total) by mouth daily for 7 days.  . nicotine (NICODERM CQ - DOSED IN MG/24 HOURS) 21 mg/24hr patch Place 1 patch (21 mg total) onto the skin daily. (Patient not taking: Reported on 07/18/2018)  . umeclidinium-vilanterol (ANORO ELLIPTA) 62.5-25 MCG/INH AEPB Inhale 1 puff into the lungs daily.   No facility-administered encounter medications on file as of 07/18/2018.      Review of Systems  Review of Systems  Constitutional: Positive for fatigue. Negative for chills, fever and unexpected weight change.  HENT: Negative for congestion, ear pain, postnasal drip, sinus pressure and sinus pain.   Respiratory: Positive for chest tightness and shortness of breath. Negative for cough and wheezing.   Cardiovascular: Negative for chest pain and palpitations.  Gastrointestinal: Negative for blood in stool, diarrhea, nausea and vomiting.  Genitourinary: Negative for dysuria, frequency and urgency.  Musculoskeletal: Negative for arthralgias.  Skin: Negative for color change.  Allergic/Immunologic: Negative for environmental allergies and food allergies.  Neurological: Negative for dizziness, light-headedness and headaches.  Psychiatric/Behavioral: Negative for dysphoric mood. The patient is not nervous/anxious.   All other systems  reviewed and are negative.    Physical Exam  BP 108/76   Pulse 98   Ht 5' 1.5" (1.562 m)   Wt 175 lb 3.2 oz (79.5 kg)   SpO2 92% Comment: 85 after walking to room , 2 minutes of deep breathing came up to 92 on room air  BMI 32.57 kg/m   Wt Readings from Last 5 Encounters:  07/18/18 175 lb 3.2 oz (79.5 kg)  06/28/18 176 lb (79.8 kg)  01/01/14 182 lb 9.6 oz (82.8 kg)  03/01/12 140 lb (63.5 kg)   RA   Physical Exam  Constitutional: She is oriented to person, place, and time and well-developed, well-nourished, and in no distress. No distress.  HENT:  Head: Normocephalic and atraumatic.  Right Ear: Hearing, tympanic membrane, external ear and ear canal normal.  Left Ear: Hearing, tympanic membrane, external ear and ear canal normal.  Nose: Nose normal. Right sinus exhibits no maxillary sinus tenderness and no frontal sinus tenderness. Left sinus exhibits no maxillary sinus tenderness and no frontal sinus tenderness.  Mouth/Throat: Uvula is midline and oropharynx is clear and moist. No oropharyngeal exudate.  Eyes: Pupils are equal, round, and reactive to light.  Neck: Normal range of motion. Neck supple. No JVD present.  Cardiovascular: Normal rate, regular rhythm and normal heart sounds.  Pulmonary/Chest: Effort normal. No accessory muscle usage. No respiratory distress. She has no decreased breath sounds. She has no wheezes. She has no rhonchi. She has rales (Bibasilar crackles).  Abdominal: Soft. Bowel sounds are normal. There is no tenderness.  Musculoskeletal: Normal range of motion. She exhibits no edema.  Scoliosis  Lymphadenopathy:    She has no cervical adenopathy.  Neurological: She is alert and oriented to person, place, and time. Gait normal.  Skin: Skin is warm and dry. She is not diaphoretic. No erythema.     Psychiatric: Memory, affect and judgment normal. Her mood appears anxious.  Nursing note and vitals reviewed.   Walk in office today for POC reveals patient  needs 4 L pulse to maintain oxygen saturations greater than 90%  Lab Results:  CBC    Component Value Date/Time   WBC 7.4 06/28/2018 0428   RBC 5.07 06/28/2018 0428   HGB 13.9 06/28/2018 0428   HCT 46.0 06/28/2018 0428   PLT 290 06/28/2018 0428   MCV 90.7 06/28/2018 0428   MCV 89.0 01/01/2014 1022   MCH 27.4 06/28/2018 0428   MCHC 30.2 06/28/2018 0428   RDW 13.7 06/28/2018 0428   LYMPHSABS 2.6 06/24/2018 2325   MONOABS 0.7 06/24/2018 2325   EOSABS 0.0 06/24/2018 2325   BASOSABS 0.1 06/24/2018 2325    BMET    Component Value Date/Time   NA 137 07/18/2018 1454   NA 140 01/01/2014 0933   K 4.7 07/18/2018 1454   CL 102 07/18/2018 1454   CO2 30 07/18/2018 1454   GLUCOSE 104 (H) 07/18/2018 1454   BUN 7 07/18/2018 1454   BUN 6 01/01/2014 0933   CREATININE 0.71 07/18/2018 1454   CALCIUM 9.3 07/18/2018 1454   GFRNONAA >60 06/28/2018 0428   GFRAA >60 06/28/2018 0428    BNP    Component Value Date/Time   BNP 458.3 (H) 06/25/2018 1521    ProBNP No results found for: PROBNP    Assessment & Plan:   62 year old female patient completing hospital follow-up today.  Patient slightly overwhelmed with work-up and discharge after hospital.  I have reviewed with the patient in the hospital visits as well as  CT.   Patient will need to complete pulmonary function testing, will start patient on Anoro Ellipta, emphasized the importance of her stopping smoking.  Could consider Chantix in the future if patient struggles with patches as well as lozenge therapy.  Patient is not actively interested in starting Chantix at this time.  Patient needs to set a quit date.  Pneumovax 23 given today.   Walk for POC today.  Order for POC placed.  Patient will need to wear oxygen which she already has at home.  Patient to check oxygen saturations to ensure her oxygen saturations are remaining greater than 90%.  Patient to follow-up with Dr. Valeta Harms and a 30-minute visit this patient will probably need  bronchoscopy after repeat CT in 6 to 8 weeks.  Tobacco abuse  We recommend that you stop smoking.  >>>You need to set a quit date >>>If you have friends or family who smoke, let them know you are trying to quit and not to smoke around you or in your living environment  Smoking Cessation Resources:  1 800 QUIT NOW  >>> Patient to call this resource and utilize it to help support her quit smoking >>> Keep up your hard work with stopping smoking  You can also contact the [facility] >>>For smoking cessation classes call [phone]  We do not recommend using e-cigarettes as a form of stopping smoking  You can sign up for smoking cessation support texts and information:  >>>https://smokefree.gov/smokefreetxt   Nicotine patches: >>>Make sure you rotate sites that you do not get skin irritation, Apply 1 patch each morning to a non-hairy skin site  If you are smoking greater than 10 cigarettes/day and weigh over 45 kg start with the nicotine patch of 21 mg a day for 6 weeks, then 14 mg a day for 2 weeks, then finished with 7 mg a day for 2 weeks, then stop  >>>If insomnia occurs you are having trouble sleeping you can take the patch off at night, and place a new one on in the morning >>>If the patch is removed at night and you have morning cravings start short acting nicotine replacement therapy such as gum or lozenges  Nicotine lozenge: Lozenges are commonly uses short acting NRT product  >>>Smokers who smoke within 30 minutes of awakening should use 4 mg dose >>>Smokers who wait more than 30 minutes after awakening to smoke should use 2 mg dose  Can use up to 1 lozenge every 1-2 hours for 6 weeks >>>Total amount of lozenges that can be used per day as 20 >>>Gradually reduce number of lozenges used per day after 2 weeks of use  Place lozenge in mouth and allowed to dissolve for 30 minutes loss and does not need to be chewed  Lozenges have advantages to be able to be  used in people with TMG, poor dentition, dentures  >>>Avoid acidic beverages such as coffee, carbonated beverages before and during gum use.  A soft acidic beverages lower oral pH which cause nicotine to not be absorbed properly >>>If you chew the gum too quickly or vigorously you could have nausea, vomiting, abdominal pain, constipation, hiccups, headache, sore jaw, mouth irritation ulcers   Healthcare maintenance Labwork today   PCP follow up  - 08/05/18  >>> needs management of Edema   Keep follow up with Cardiology  >>>Echo on 07/29/18 >>>appt with Cardiology   Stop smoking Pneumovax 23 today    Lung mass Walk today for POC  Continue to  wear oxygen 2 L with exertion to maintain oxygen saturations greater than 88 to 90%  We will order pulmonary function testing free to be completed in November/2019 >>>you will follow-up with Dr. Valeta Harms after that appointment >>>Follow up with Dr. Valeta Harms - 50min appt   We will repeat CT without contrast in 6 to 8 weeks.  We recommend that you stop smoking.    Emphysema lung ([place])  Walk today for POC  Continue to wear oxygen 2 L with exertion to maintain oxygen saturations greater than 88 to 90%  We will order pulmonary function testing free to be completed in November/2019 >>>you will follow-up with Dr. Valeta Harms after that appointment >>>Follow up with Dr. Valeta Harms - 77min appt    We will repeat CT without contrast in 6 to 8 weeks.  Note your daily symptoms > remember "red flags" for COPD:   >>>Increase in cough >>>increase in sputum production >>>increase in shortness of breath or activity  intolerance.   If you notice these symptoms, please call the office to be seen.   Will start:   Anoro Ellipta  >>> Take 1 puff daily in the morning right when you wake up >>>Rinse your mouth out after use >>>This is a daily maintenance inhaler, NOT a rescue inhaler >>>Contact our office if you are having difficulties affording or obtaining  this medication >>>It is important for you to be able to take this daily and not miss any doses   Pneumovax 23 today  We recommend that you stop smoking.  >>>You need to set a quit date >>>If you have friends or family who smoke, let them know you are trying to quit and not to smoke around you or in your living environment  Smoking Cessation Resources:  1 [place]  >>> Patient to call this resource and utilize it to help support her quit smoking >>> Keep up your hard work with stopping smoking  You can also contact the [facility] >>>For smoking cessation classes call [phone]  We do not recommend using e-cigarettes as a form of stopping smoking  You can sign up for smoking cessation support texts and information:  >>>https://smokefree.gov/smokefreetxt   Acute CHF (congestive heart failure) ([place]) Labwork today  >>>BMET  PCP follow up  - 08/05/18  >>> needs management of Edema   Keep follow up with Cardiology  >>>Echo on 07/29/18 >>>appt with Cardiology    Pulmonary embolism ([place]) Continue Eliquis  Chronic respiratory failure with hypoxia ([place])  Walk today for POC >>> 4 L pulsed  Continue to wear oxygen 2 L with exertion to maintain oxygen saturations greater than 88 to 90%   We will order pulmonary function testing free to be completed in November/2019 >>>you will follow-up with Dr. Valeta Harms after that appointment >>>Follow up with Dr. Valeta Harms - 72min appt     This appointment was 55 minutes along with over 50% that time direct face-to-face patient care, assessment, plan of care follow-up.   Lauraine Rinne, NP 07/18/2018

## 2018-07-18 NOTE — Progress Notes (Signed)
Kidney functioning stable.  No changes with plan of care.  Keep follow-up with cardiology.  Like I said an office visit would be good for you to get lower extremity support hose to help work on lower extremity swelling.  Continue to follow low-sodium diet.  Continue to weigh yourself daily.  Wyn Quaker, FNP

## 2018-07-18 NOTE — Assessment & Plan Note (Signed)
  We recommend that you stop smoking.  >>>You need to set a quit date >>>If you have friends or family who smoke, let them know you are trying to quit and not to smoke around you or in your living environment  Smoking Cessation Resources:  1 800 QUIT NOW  >>> Patient to call this resource and utilize it to help support her quit smoking >>> Keep up your hard work with stopping smoking  You can also contact the [facility]) >>>For smoking cessation classes call [phone]  We do not recommend using e-cigarettes as a form of stopping smoking  You can sign up for smoking cessation support texts and information:  >>>https://smokefree.gov/smokefreetxt   Nicotine patches: >>>Make sure you rotate sites that you do not get skin irritation, Apply 1 patch each morning to a non-hairy skin site  If you are smoking greater than 10 cigarettes/day and weigh over 45 kg start with the nicotine patch of 21 mg a day for 6 weeks, then 14 mg a day for 2 weeks, then finished with 7 mg a day for 2 weeks, then stop  >>>If insomnia occurs you are having trouble sleeping you can take the patch off at night, and place a new one on in the morning >>>If the patch is removed at night and you have morning cravings start short acting nicotine replacement therapy such as gum or lozenges  Nicotine lozenge: Lozenges are commonly uses short acting NRT product  >>>Smokers who smoke within 30 minutes of awakening should use 4 mg dose >>>Smokers who wait more than 30 minutes after awakening to smoke should use 2 mg dose  Can use up to 1 lozenge every 1-2 hours for 6 weeks >>>Total amount of lozenges that can be used per day as 20 >>>Gradually reduce number of lozenges used per day after 2 weeks of use  Place lozenge in mouth and allowed to dissolve for 30 minutes loss and does not need to be chewed  Lozenges have advantages to be able to be used in people with TMG, poor dentition, dentures  >>>Avoid  acidic beverages such as coffee, carbonated beverages before and during gum use.  A soft acidic beverages lower oral pH which cause nicotine to not be absorbed properly >>>If you chew the gum too quickly or vigorously you could have nausea, vomiting, abdominal pain, constipation, hiccups, headache, sore jaw, mouth irritation ulcers

## 2018-07-18 NOTE — Assessment & Plan Note (Signed)
Walk today for POC  Continue to wear oxygen 2 L with exertion to maintain oxygen saturations greater than 88 to 90%  We will order pulmonary function testing free to be completed in November/2019 >>>you will follow-up with Dr. Valeta Harms after that appointment >>>Follow up with Dr. Valeta Harms - 48min appt   We will repeat CT without contrast in 6 to 8 weeks.  We recommend that you stop smoking.

## 2018-07-19 ENCOUNTER — Telehealth: Payer: Self-pay | Admitting: Pulmonary Disease

## 2018-07-19 NOTE — Telephone Encounter (Signed)
Called and spoke with patient she wanted to know if she can use both the albuterol and Anoro in the same day. Advised patient that was ok. Nothing further needed.

## 2018-07-23 ENCOUNTER — Telehealth: Payer: Self-pay | Admitting: Pulmonary Disease

## 2018-07-23 DIAGNOSIS — J9611 Chronic respiratory failure with hypoxia: Secondary | ICD-10-CM

## 2018-07-23 NOTE — Telephone Encounter (Signed)
Called and spoke to patient, patient states she uses a POC but because she has back problems it is too heavy. Informed patient I would call [facility] and get more information regarding a smaller tank. Called and spoke to [place] at [facility] they have a ML6 tanks that weighs 2lbs and at 4L pulsed it will last her 3 and 1/2  Hours. Called and spoke to patient, patient is okay with this and would like a order placed. Informed patient I would let BM know and we would get back with her.   BM please advise if okay to place order for [facility] tank and patient request.

## 2018-07-23 NOTE — Telephone Encounter (Signed)
Okay to place order.   Wyn Quaker FNP

## 2018-07-23 NOTE — Telephone Encounter (Signed)
Per Wyn Quaker, NP, ok for DME order for POC.  Order placed for POC, ML6 tank. Patient notified of order.  Nothing further at this time.

## 2018-07-25 ENCOUNTER — Telehealth: Payer: Self-pay | Admitting: Pulmonary Disease

## 2018-07-25 NOTE — Telephone Encounter (Signed)
Called and spoke with [place], [facility].  Order for [facility] tank was placed 07/23/18.  [facility] does not currently have POC. Corene Cornea stated that they are processing her ML6 order, and someone will contact her shortly to change out tanks.  Called and spoke with Patient.  Explained the [facility] currently does not have POC, the ML6 weighs 2 lbs and last 31/2 hours.  Patient stated understanding. Nothing further at this time.

## 2018-07-29 ENCOUNTER — Telehealth: Payer: Self-pay | Admitting: Pulmonary Disease

## 2018-07-29 ENCOUNTER — Other Ambulatory Visit: Payer: Self-pay

## 2018-07-29 ENCOUNTER — Ambulatory Visit (HOSPITAL_COMMUNITY): Payer: 59 | Attending: Cardiology

## 2018-07-29 DIAGNOSIS — I240 Acute coronary thrombosis not resulting in myocardial infarction: Secondary | ICD-10-CM

## 2018-07-29 DIAGNOSIS — I513 Intracardiac thrombosis, not elsewhere classified: Secondary | ICD-10-CM | POA: Diagnosis not present

## 2018-07-29 MED ORDER — UMECLIDINIUM-VILANTEROL 62.5-25 MCG/INH IN AEPB: 1.0000 | INHALATION_SPRAY | Freq: Every day | RESPIRATORY_TRACT | 3 refills | Status: DC

## 2018-07-29 MED ORDER — PERFLUTREN LIPID MICROSPHERE
1.0000 mL | INTRAVENOUS | Status: AC | PRN
  Administered 2018-07-29: 2 mL via INTRAVENOUS

## 2018-07-29 NOTE — Telephone Encounter (Signed)
There are lots of medications that are similar to Anoro that we could put the patient on.  If the patient is unable to afford Anoro at this time she could present to our office and we can provide a sample if we have one.  Patient also needs to contact her insurance company and asked them what medication is formulary for them.  She is looking for a LABA/LAMA inhaler.  Once she has received an update from her insurance company on what medication they would prefer for her to be on that we can definitely trial her on that.  Wyn Quaker FNP

## 2018-07-29 NOTE — Telephone Encounter (Signed)
Called and spoke to patient, made patient aware she needs to call her insurance and find out what inhalers are on her formulary. Patient voices understanding and states she will call back once she contacts her insurance. Nothing further is needed at this time.

## 2018-07-29 NOTE — Telephone Encounter (Signed)
Called and spoke to patient, patient states she was started on a ne inhaler, Anoro, and needed it sent to her pharmacy. Confirmed pharmacy and order sent. Nothing further is needed at this time.

## 2018-07-29 NOTE — Telephone Encounter (Signed)
Called and spoke to patient, per patient her insurance does cover her anoro. She states earlier she never checked with the pharmacy she was reading somewhere online where it states it would cost that much. Patient is now requesting that we send in refill of Anoro. Made patient aware the refill was already sent. Voiced understanding. Nothing further is needed at this time.

## 2018-07-29 NOTE — Telephone Encounter (Signed)
Called and spoke with patient, she stated that the Anoro has helped her however the cost is too expensive, she is wanting to know if there is something similar to Anoro that she can take and it may not cost as much.   Aaron Edelman please advise, thank you.

## 2018-07-30 ENCOUNTER — Encounter: Payer: Self-pay | Admitting: Internal Medicine

## 2018-07-30 ENCOUNTER — Telehealth: Payer: Self-pay | Admitting: Pulmonary Disease

## 2018-07-30 ENCOUNTER — Ambulatory Visit (INDEPENDENT_AMBULATORY_CARE_PROVIDER_SITE_OTHER): Payer: 59 | Admitting: Internal Medicine

## 2018-07-30 VITALS — BP 110/70 | HR 100 | Ht 61.0 in | Wt 174.0 lb

## 2018-07-30 DIAGNOSIS — I2699 Other pulmonary embolism without acute cor pulmonale: Secondary | ICD-10-CM | POA: Diagnosis not present

## 2018-07-30 DIAGNOSIS — Z720 Tobacco use: Secondary | ICD-10-CM | POA: Diagnosis not present

## 2018-07-30 DIAGNOSIS — I5031 Acute diastolic (congestive) heart failure: Secondary | ICD-10-CM | POA: Diagnosis not present

## 2018-07-30 DIAGNOSIS — I252 Old myocardial infarction: Secondary | ICD-10-CM | POA: Diagnosis not present

## 2018-07-30 NOTE — Telephone Encounter (Signed)
Her CT results from October were:   10/14/19CTA PE>>Small bilateral PE; severe emphysema w/ extensive septal thickening and concern for new parenchymal disease in the right lung which could represent an infectious or inflammatory process. Emphysema. Mediastinal and hilar lymphadenopathy. Concern for RLL or right hilar lesion measuring 2.4 cm.     I stand by the recommendations provided at her last office vist. I recommend that she completes her follow up CT to follow her lung mass. Keep follow up with Dr. Valeta Harms. No changes in plan of care.   So yes she still needs to complete the CT.   Wyn Quaker FNP

## 2018-07-30 NOTE — Telephone Encounter (Signed)
Spoke with pt, she wanted to ask Aaron Edelman if she had to do the CT chest scheduled on 12/19. I advised her that I would ask Aaron Edelman but most likely he will say to have the CT done. I explained that with her smoking history and emphysema, its best to have it done. Aaron Edelman please advise.

## 2018-07-30 NOTE — Telephone Encounter (Signed)
Called pt and advised message from the provider. Pt understood and verbalized understanding. Nothing further is needed.    

## 2018-07-30 NOTE — Progress Notes (Signed)
Cardiology Office Note:    Date:  08/18/2018   ID:  Dawn Nichols, DOB 03-18-1956, MRN [id number]  PCP:  [name], DO  Cardiologist:  Elouise Munroe, MD  Electrophysiologist:  None   Referring MD: No ref. provider found   Follow-up echocardiogram evaluating for RV thrombus  History of Present Illness:    Dawn Nichols is a 62 y.o. female with a hx of tobacco use, low back pain, scoliosis, and peripheral neuropathy.  I had seen her initially in the hospital when she presented for shortness of breath on exertion for 3 weeks prior to presentation.  She underwent a CT PE angiogram of the chest that was positive for bilateral pulmonary emboli.  She was also found to have a possible right hilar/lower lobe malignancy.  Due to anterolateral T wave inversions and troponin elevation, coronary angiogram was pursued, finding only mild nonobstructive CAD.  Prior to coronary angiogram, an echocardiogram was performed demonstrating right ventricular depressed function with regional apical dysfunction, and cannot exclude RV apical thrombus.  In the setting of pulmonary emboli this was concerning for additional right-sided thrombi as a potential source for her PE.  Lower extremity Dopplers were negative.  She was discharged home on apixaban 5 mg twice daily.  She has done well since that time and has no acute concerns we reviewed the findings of her echocardiogram performed yesterday, showing moderately dilated RV with moderate decrease in systolic function and akinesis of the apex.  Prominent trabeculations were seen in the RV apex, but no thrombus was seen.  I have communicated these results to the patient and we were both encouraged to see that there was no residual thrombus in the RV.  It was difficult to say whether there was truly thrombus on the initial echocardiogram, however she has been on anticoagulation since that time.    Past Medical History:  Diagnosis Date  . Arthritis   . CHF  (congestive heart failure) ([place])   . Emphysema of lung ([place])   . Hyperlipidemia   . NSTEMI (non-ST elevated myocardial infarction) ([place]) 06/25/2018  . Tobacco use     Past Surgical History:  Procedure Laterality Date  . CESAREAN SECTION     Two  . LEFT HEART CATH AND CORONARY ANGIOGRAPHY N/A 06/26/2018   Procedure: LEFT HEART CATH AND CORONARY ANGIOGRAPHY;  Surgeon: Troy Sine, MD;  Location: [place] [facility];  Service: Cardiovascular;  Laterality: N/A;  . SPINE SURGERY      Current Medications: Current Meds  Medication Sig  . albuterol (PROVENTIL HFA;VENTOLIN HFA) 108 (90 Base) MCG/ACT inhaler Inhale 2 puffs into the lungs every 6 (six) hours as needed for wheezing or shortness of breath.  [name] apixaban (ELIQUIS) 5 MG TABS tablet 2 tab po bid x 7 days then 1 tab po bid x 3 months  . aspirin 81 MG chewable tablet Chew 1 tablet (81 mg total) by mouth daily.  [name] atorvastatin (LIPITOR) 80 MG tablet Take 1 tablet (80 mg total) by mouth daily at 6 PM.  . gabapentin (NEURONTIN) 600 MG tablet Take 600 mg by mouth 4 (four) times daily.   . nicotine (NICODERM CQ - DOSED IN MG/24 HOURS) 21 mg/24hr patch Place 1 patch (21 mg total) onto the skin daily.  [name] oxyCODONE-acetaminophen (PERCOCET) 10-325 MG per tablet Take 1 tablet by mouth 3 (three) times daily as needed for pain. PRN up to three times daily  . [DISCONTINUED] furosemide (LASIX) 20 MG tablet Take  1 tablet (20 mg total) by mouth daily for 7 days.  . [DISCONTINUED] umeclidinium-vilanterol (ANORO ELLIPTA) 62.5-25 MCG/INH AEPB Inhale 1 puff into the lungs daily.     Allergies:   Patient has no known allergies.   Social History   Socioeconomic History  . Marital status: Married    Spouse name: Not on file  . Number of children: 2  . Years of education: Not on file  . Highest education level: 12th grade  Occupational History  . Occupation: Disabled  Social Needs  . Financial resource strain: Not very hard  . Food insecurity:     Worry: Never true    Inability: Never true  . Transportation needs:    Medical: No    Non-medical: No  Tobacco Use  . Smoking status: Current Every Day Smoker    Packs/day: 0.50    Years: 45.00    Pack years: 22.50    Types: Cigarettes  . Smokeless tobacco: Never Used  . Tobacco comment: 4-5 cigarettes per day 11.26.19  Substance and Sexual Activity  . Alcohol use: Not Currently  . Drug use: Never  . Sexual activity: Not Currently  Lifestyle  . Physical activity:    Days per week: 0 days    Minutes per session: 0 min  . Stress: Not at all  Relationships  . Social connections:    Talks on phone: More than three times a week    Gets together: Twice a week    Attends religious service: 1 to 4 times per year    Active member of club or organization: No    Attends meetings of clubs or organizations: Never    Relationship status: Married  Other Topics Concern  . Not on file  Social History Narrative  . Not on file     Family History: The patient's family history includes Cancer in her father and mother; Diabetes in her sister.  ROS:   Please see the history of present illness.    All other systems reviewed and are negative.  EKGs/Labs/Other Studies Reviewed:    The following studies were reviewed today:  EKG:  EKG is ordered today.  The ekg ordered today demonstrates normal sinus rhythm, left atrial enlargement, ventricular rate 100 bpm  Recent Labs: 06/25/2018: B Natriuretic Peptide 458.3 06/27/2018: Magnesium 2.1 06/28/2018: ALT 46; Hemoglobin 13.9; Platelets 290 07/18/2018: BUN 7; Creatinine, Ser 0.71; Potassium 4.7; Sodium 137  Recent Lipid Panel    Component Value Date/Time   CHOL 158 01/01/2014 0933   TRIG 87 01/01/2014 0933   HDL 34 (L) 01/01/2014 0933   CHOLHDL 4.6 (H) 01/01/2014 0933   LDLCALC 107 (H) 01/01/2014 0933    Physical Exam:    VS:  BP 110/70   Pulse 100   Ht 5\' 1"  (1.549 m)   Wt 174 lb (78.9 kg)   BMI 32.88 kg/m     Wt Readings  from Last 3 Encounters:  08/07/18 173 lb (78.5 kg)  08/07/18 173 lb (78.5 kg)  08/06/18 172 lb (78 kg)     Constitutional: No acute distress Eyes: pupils equally round and reactive to light, sclera non-icteric, normal conjunctiva and lids ENMT: normal dentition, moist mucous membranes Cardiovascular: regular rhythm, normal rate, no murmurs. S1 and S2 normal. Radial pulses normal bilaterally. No jugular venous distention.  Respiratory: clear to auscultation bilaterally GI : normal bowel sounds, soft and nontender. No distention.   MSK: extremities warm, well perfused. No edema.  NEURO: grossly nonfocal exam, moves  all extremities. PSYCH: alert and oriented x 3, normal mood and affect.      ASSESSMENT:    1. Acute diastolic congestive heart failure (HCC)   2. Other acute pulmonary embolism without acute cor pulmonale ([place])   3. Tobacco abuse   4. History of non-ST elevation myocardial infarction (NSTEMI)    PLAN:    Her right ventricle is moderately dilated, and this could be in the setting of pulmonary emboli.  It is strange that there is apical akinesis despite mild coronary artery disease.  We will follow this over time.  She had a concerning lung mass that will need CT follow-up, to be coordinated by her primary care physician, however I would be happy to assist if necessary. I will see her in follow-up in 3 months time.  Medication Adjustments/Labs and Tests Ordered: Current medicines are reviewed at length with the patient today.  Concerns regarding medicines are outlined above.  Orders Placed This Encounter  Procedures  . EKG 12-Lead   No orders of the defined types were placed in this encounter.   Patient Instructions  Medication Instructions:  Your physician recommends that you continue on your current medications as directed. Please refer to the Current Medication list given to you today.  If you need a refill on your cardiac medications before your next  appointment, please call your pharmacy.   Lab work: None ordered  Testing/Procedures: None ordered  Follow-Up: At [facility], you and your health needs are our priority.  As part of our continuing mission to provide you with exceptional heart care, we have created designated Provider Care Teams.  These Care Teams include your primary Cardiologist (physician) and Advanced Practice Providers (APPs -  Physician Assistants and Nurse Practitioners) who all work together to provide you with the care you need, when you need it. You will need a follow up appointment in 3 months.    You may see Elouise Munroe, MD or one of the following Advanced Practice Providers on your designated Care Team:   Rosaria Ferries, PA-C . Jory Sims, DNP, ANP  Any Other Special Instructions Will Be Listed Below (If Applicable).       Signed, Elouise Munroe, MD  08/18/2018 9:40 PM    [place]

## 2018-07-30 NOTE — Patient Instructions (Signed)
Medication Instructions:  Your physician recommends that you continue on your current medications as directed. Please refer to the Current Medication list given to you today.  If you need a refill on your cardiac medications before your next appointment, please call your pharmacy.   Lab work: None ordered  Testing/Procedures: None ordered  Follow-Up: At [facility], you and your health needs are our priority.  As part of our continuing mission to provide you with exceptional heart care, we have created designated Provider Care Teams.  These Care Teams include your primary Cardiologist (physician) and Advanced Practice Providers (APPs -  Physician Assistants and Nurse Practitioners) who all work together to provide you with the care you need, when you need it. You will need a follow up appointment in 3 months.    You may see Elouise Munroe, MD or one of the following Advanced Practice Providers on your designated Care Team:   Rosaria Ferries, PA-C . Jory Sims, DNP, ANP  Any Other Special Instructions Will Be Listed Below (If Applicable).

## 2018-07-31 ENCOUNTER — Telehealth: Payer: Self-pay | Admitting: Pulmonary Disease

## 2018-07-31 MED ORDER — UMECLIDINIUM-VILANTEROL 62.5-25 MCG/INH IN AEPB: 1.0000 | INHALATION_SPRAY | Freq: Every day | RESPIRATORY_TRACT | 3 refills | Status: DC

## 2018-07-31 NOTE — Telephone Encounter (Signed)
Called and spoke to patient, pharmacy did not receive anoro refill. Upon review script was sent as a sample. Script resent. Called and made patient aware refill has been sent. Nothing further needed at this time.

## 2018-08-05 ENCOUNTER — Other Ambulatory Visit: Payer: Self-pay | Admitting: Pulmonary Disease

## 2018-08-05 ENCOUNTER — Ambulatory Visit (INDEPENDENT_AMBULATORY_CARE_PROVIDER_SITE_OTHER): Payer: PRIVATE HEALTH INSURANCE | Admitting: Family Medicine

## 2018-08-05 ENCOUNTER — Encounter: Payer: Self-pay | Admitting: Family Medicine

## 2018-08-05 VITALS — BP 114/75 | HR 110 | Temp 97.1°F | Ht 61.0 in | Wt 173.0 lb

## 2018-08-05 DIAGNOSIS — I5031 Acute diastolic (congestive) heart failure: Secondary | ICD-10-CM

## 2018-08-05 DIAGNOSIS — Z720 Tobacco use: Secondary | ICD-10-CM

## 2018-08-05 DIAGNOSIS — Z7689 Persons encountering health services in other specified circumstances: Secondary | ICD-10-CM | POA: Diagnosis not present

## 2018-08-05 DIAGNOSIS — J432 Centrilobular emphysema: Secondary | ICD-10-CM

## 2018-08-05 DIAGNOSIS — I252 Old myocardial infarction: Secondary | ICD-10-CM

## 2018-08-05 DIAGNOSIS — I2699 Other pulmonary embolism without acute cor pulmonale: Secondary | ICD-10-CM

## 2018-08-05 DIAGNOSIS — J9611 Chronic respiratory failure with hypoxia: Secondary | ICD-10-CM

## 2018-08-05 DIAGNOSIS — M4135 Thoracogenic scoliosis, thoracolumbar region: Secondary | ICD-10-CM

## 2018-08-05 NOTE — Progress Notes (Signed)
Subjective: WE:[id number] care,  COPD, NSTEMI, PE HPI: Dawn Nichols is a 62 y.o. female presenting to clinic today for:  1. COPD/ PE/ NSTEMI/ recent hospitalization Patient was hospitalized in October for bilateral pulmonary embolus with right ventricular thrombus.  She had an NSTEMI and noted to have acute diastolic heart failure with right ventricular dysfunction.  She is currently under the care of both pulmonology and cardiology.  She is currently anticoagulated and uses 4 L of oxygen with exertion PRN.  She is on disability for her back, who she sees pain management for.  She is working on tobacco cessation and is currently on a 21 mg patch.  She smokes roughly about 1/4 to 1/3 pack/day.  She is to smoke 1 pack/day and had done so for over 45 years.  She is interested in potentially pursuing Chantix if the patches do not work for her.  She has a follow-up with pulmonology to discuss this further.  Today she does not endorse chest pain or shortness of breath outside of normal.  Symptoms have improved since she was hospitalized.  2.  Preventative care Patient has not had a Pap smear, mammogram in many years.  No history of abnormals.  She denies any abnormal uterine bleeding.  She went through menopause greater than 10 years ago.  Denies any nipple discharge, breast lumps or skin changes.  She has never had a colonoscopy.  Denies any rectal bleeding, unplanned weight loss or abdominal pain.  No family history of colon cancers.  There is a family history of brain cancer in her father and malignant melanoma in her mother.  Otherwise no known cancers.  Past Medical History:  Diagnosis Date  . COPD (chronic obstructive pulmonary disease) ([place])   . Emphysema of lung ([place])   . GERD (gastroesophageal reflux disease)   . Myocardial infarction ([place])   . Oxygen deficiency   . Tobacco use    Past Surgical History:  Procedure Laterality Date  . CESAREAN SECTION     Two  . LEFT HEART CATH AND CORONARY  ANGIOGRAPHY N/A 06/26/2018   Procedure: LEFT HEART CATH AND CORONARY ANGIOGRAPHY;  Surgeon: Troy Sine, MD;  Location: [place] [facility];  Service: Cardiovascular;  Laterality: N/A;  . SPINE SURGERY     Social History   Socioeconomic History  . Marital status: Married    Spouse name: Not on file  . Number of children: Not on file  . Years of education: Not on file  . Highest education level: Not on file  Occupational History  . Not on file  Social Needs  . Financial resource strain: Not on file  . Food insecurity:    Worry: Not on file    Inability: Not on file  . Transportation needs:    Medical: Not on file    Non-medical: Not on file  Tobacco Use  . Smoking status: Current Every Day Smoker    Packs/day: 0.50    Years: 45.00    Pack years: 22.50    Types: Cigarettes  . Smokeless tobacco: Never Used  Substance and Sexual Activity  . Alcohol use: Not Currently  . Drug use: Not on file  . Sexual activity: Not on file  Lifestyle  . Physical activity:    Days per week: Not on file    Minutes per session: Not on file  . Stress: Not on file  Relationships  . Social connections:    Talks on phone: Not on file  Gets together: Not on file    Attends religious service: Not on file    Active member of club or organization: Not on file    Attends meetings of clubs or organizations: Not on file    Relationship status: Not on file  . Intimate partner violence:    Fear of current or ex partner: Not on file    Emotionally abused: Not on file    Physically abused: Not on file    Forced sexual activity: Not on file  Other Topics Concern  . Not on file  Social History Narrative  . Not on file   Current Meds  Medication Sig  . albuterol (PROVENTIL HFA;VENTOLIN HFA) 108 (90 Base) MCG/ACT inhaler Inhale 2 puffs into the lungs every 6 (six) hours as needed for wheezing or shortness of breath.  [name] apixaban (ELIQUIS) 5 MG TABS tablet 2 tab po bid x 7 days then 1 tab po bid x  3 months  . aspirin 81 MG chewable tablet Chew 1 tablet (81 mg total) by mouth daily.  [name] gabapentin (NEURONTIN) 600 MG tablet Take 600 mg by mouth 4 (four) times daily.   . nicotine (NICODERM CQ - DOSED IN MG/24 HOURS) 21 mg/24hr patch Place 1 patch (21 mg total) onto the skin daily.  [name] oxyCODONE-acetaminophen (PERCOCET) 10-325 MG per tablet Take 1 tablet by mouth 3 (three) times daily as needed for pain. PRN up to three times daily  . umeclidinium-vilanterol (ANORO ELLIPTA) 62.5-25 MCG/INH AEPB Inhale 1 puff into the lungs daily.   Family History  Problem Relation Age of Onset  . Cancer Mother   . Cancer Father        Brain tumor   No Known Allergies   Health Maintenance: Pap, Colonoscopy, Mammo, TDap  ROS: Per HPI  Objective: Office vital signs reviewed. BP 114/75   Pulse (!) 110   Temp (!) 97.1 F (36.2 C) (Oral)   Ht 5\' 1"  (1.549 m)   Wt 173 lb (78.5 kg)   SpO2 92%   BMI 32.69 kg/m   Physical Examination:  General: Awake, alert, well nourished, No acute distress HEENT: Normal, sclera white, MMM Cardio: regular rate and rhythm, S1S2 heard, no murmurs appreciated Pulm: clear to auscultation bilaterally, no wheezes, rhonchi or rales; normal work of breathing on room air Extremities: warm, well perfused, No edema, cyanosis or clubbing; +2 pulses bilaterally MSK: Substantial scoliotic curve with prominent left rib pump noted.  She ambulates independently.    Assessment/ Plan: 62 y.o. female   1. Centrilobular emphysema (HCC) Pulse ox within normal limits on room air. Seeing [facility] pulmonology w/ next appt in 2 weeks.  Will continue to follow w/ their recommendations.  Currently anticoagulated and on Anoro.  CT with emphysema as well as right sided lung mass.  Biopsy planned at some point, I suspect once she has been stable from a pulmonary embolism standpoint.  2. Establishing care with new doctor, encounter for I reviewed her discharge summary and imaging report.  3.  Tobacco abuse Action phase of cessation.  Considering Chantix if Nicotine patches not helpful.  No hx of seizures.  4. History of non-ST elevation myocardial infarction (NSTEMI) Seeing cardiology.  5. Acute pulmonary embolism, unspecified pulmonary embolism type, unspecified whether acute cor pulmonale present ([place]) As above  6. Acute diastolic congestive heart failure ([place]) As above  7. Thoracogenic scoliosis of thoracolumbar region Followed by spine and treated with chronic opioids.  She will schedule a physical exam with  Pap smear.  We discussed obtaining fasting labs including lipid panel during that visit.  She will schedule her mammogram.  We discussed Cologuard briefly during today's visit.  Plan for Cologuard versus colonoscopy.  I suspect given recent cardiac issues and normal/average risk for colon cancer, Cologuard would be less risky for this patient with multiple comorbidities.   [name], DO [place] [phone]

## 2018-08-05 NOTE — Patient Instructions (Addendum)
Have your specialists send me your records with each visit.  Plan to see me soon for your physical.  We will update your pap smear, labs and get you up to speed on preventative health care.  Schedule your mammogram when you check out.  We will talk about colon cancer screening at your next visit.  I think you may be a good candidate for Cologuard.

## 2018-08-06 ENCOUNTER — Ambulatory Visit (INDEPENDENT_AMBULATORY_CARE_PROVIDER_SITE_OTHER): Payer: 59 | Admitting: Pulmonary Disease

## 2018-08-06 ENCOUNTER — Encounter: Payer: Self-pay | Admitting: Pulmonary Disease

## 2018-08-06 VITALS — BP 142/78 | HR 106 | Ht 61.0 in | Wt 172.0 lb

## 2018-08-06 DIAGNOSIS — R590 Localized enlarged lymph nodes: Secondary | ICD-10-CM

## 2018-08-06 DIAGNOSIS — Z720 Tobacco use: Secondary | ICD-10-CM

## 2018-08-06 DIAGNOSIS — R918 Other nonspecific abnormal finding of lung field: Secondary | ICD-10-CM | POA: Diagnosis not present

## 2018-08-06 DIAGNOSIS — R911 Solitary pulmonary nodule: Secondary | ICD-10-CM

## 2018-08-06 DIAGNOSIS — J9611 Chronic respiratory failure with hypoxia: Secondary | ICD-10-CM

## 2018-08-06 DIAGNOSIS — I2699 Other pulmonary embolism without acute cor pulmonale: Secondary | ICD-10-CM | POA: Diagnosis not present

## 2018-08-06 DIAGNOSIS — J849 Interstitial pulmonary disease, unspecified: Secondary | ICD-10-CM

## 2018-08-06 LAB — PULMONARY FUNCTION TEST
DL/VA % PRED: 62 %
DL/VA: 2.73 ml/min/mmHg/L
DLCO unc % pred: 46 %
DLCO unc: 9.34 ml/min/mmHg
FEF 25-75 POST: 2.26 L/s
FEF 25-75 Pre: 1.54 L/sec
FEF2575-%CHANGE-POST: 46 %
FEF2575-%PRED-PRE: 72 %
FEF2575-%Pred-Post: 105 %
FEV1-%CHANGE-POST: 5 %
FEV1-%Pred-Post: 93 %
FEV1-%Pred-Pre: 88 %
FEV1-PRE: 2 L
FEV1-Post: 2.11 L
FEV1FVC-%CHANGE-POST: 6 %
FEV1FVC-%Pred-Pre: 101 %
FEV6-%Change-Post: 0 %
FEV6-%PRED-PRE: 89 %
FEV6-%Pred-Post: 89 %
FEV6-Post: 2.52 L
FEV6-Pre: 2.53 L
FEV6FVC-%Change-Post: 0 %
FEV6FVC-%Pred-Post: 103 %
FEV6FVC-%Pred-Pre: 103 %
FVC-%CHANGE-POST: 0 %
FVC-%PRED-PRE: 86 %
FVC-%Pred-Post: 86 %
FVC-POST: 2.52 L
FVC-PRE: 2.53 L
POST FEV6/FVC RATIO: 100 %
PRE FEV1/FVC RATIO: 79 %
PRE FEV6/FVC RATIO: 100 %
Post FEV1/FVC ratio: 84 %
RV % pred: 71 %
RV: 1.33 L
TLC % pred: 90 %
TLC: 4.18 L

## 2018-08-06 NOTE — Progress Notes (Signed)
Synopsis: Referred in November for SOB by [name], DO  Subjective:   PATIENT ID: Dawn Nichols GENDER: female DOB: May 10, 1962, MRN: [id number]  Chief Complaint  Patient presents with  . Consult    States she is here to figure out if she has COPD or emphysema. States she has interrmittent SOB. She uses [employer] prn. DME [facility].     She is here today with complaints of SOB. She was seen in the ED recently and diagnosed with pneumonia, admitted to the hospital. Before that she was on a trip to the beach and was having difficulty walking and keeping up with her family. She was found to be hypoxic at a local urgent care and she was taken to the ED. She had positive troponin, positive BNP, CTA positive for PE and was started eliquis. CT imaging also revealed a right hilar mass and adenopathy.   Current smoker, past 45 years, 1 ppd, and recently has cut down to 4-5 per day.  Since her hospitalization she is feeling somewhat better.  She is taking her anticoagulant as directed.  She still has significant dyspnea on exertion.  Occasionally has cough.   Past Medical History:  Diagnosis Date  . NSTEMI (non-ST elevated myocardial infarction) ([place]) 06/25/2018  . Tobacco use      Family History  Problem Relation Age of Onset  . Cancer Mother   . Cancer Father        Brain tumor     Past Surgical History:  Procedure Laterality Date  . CESAREAN SECTION     Two  . LEFT HEART CATH AND CORONARY ANGIOGRAPHY N/A 06/26/2018   Procedure: LEFT HEART CATH AND CORONARY ANGIOGRAPHY;  Surgeon: Troy Sine, MD;  Location: [facility];  Service: Cardiovascular;  Laterality: N/A;  . SPINE SURGERY      Social History   Socioeconomic History  . Marital status: Married    Spouse name: Not on file  . Number of children: Not on file  . Years of education: Not on file  . Highest education level: Not on file  Occupational History  . Not on file  Social Needs  . Financial resource strain: Not  on file  . Food insecurity:    Worry: Not on file    Inability: Not on file  . Transportation needs:    Medical: Not on file    Non-medical: Not on file  Tobacco Use  . Smoking status: Current Every Day Smoker    Packs/day: 0.50    Years: 45.00    Pack years: 22.50    Types: Cigarettes  . Smokeless tobacco: Never Used  . Tobacco comment: 4-5 cigarettes per day 11.26.19  Substance and Sexual Activity  . Alcohol use: Not Currently  . Drug use: Not on file  . Sexual activity: Not on file  Lifestyle  . Physical activity:    Days per week: Not on file    Minutes per session: Not on file  . Stress: Not on file  Relationships  . Social connections:    Talks on phone: Not on file    Gets together: Not on file    Attends religious service: Not on file    Active member of club or organization: Not on file    Attends meetings of clubs or organizations: Not on file    Relationship status: Not on file  . Intimate partner violence:    Fear of current or ex partner: Not on  file    Emotionally abused: Not on file    Physically abused: Not on file    Forced sexual activity: Not on file  Other Topics Concern  . Not on file  Social History Narrative  . Not on file     No Known Allergies   Outpatient Medications Prior to Visit  Medication Sig Dispense Refill  . albuterol (PROVENTIL HFA;VENTOLIN HFA) 108 (90 Base) MCG/ACT inhaler Inhale 2 puffs into the lungs every 6 (six) hours as needed for wheezing or shortness of breath. 1 Inhaler 2  . apixaban (ELIQUIS) 5 MG TABS tablet 2 tab po bid x 7 days then 1 tab po bid x 3 months 60 tablet 3  . aspirin 81 MG chewable tablet Chew 1 tablet (81 mg total) by mouth daily. 30 tablet 3  . gabapentin (NEURONTIN) 600 MG tablet Take 600 mg by mouth 4 (four) times daily.     . nicotine (NICODERM CQ - DOSED IN MG/24 HOURS) 21 mg/24hr patch Place 1 patch (21 mg total) onto the skin daily. 30 patch 1  . oxyCODONE-acetaminophen (PERCOCET) 10-325 MG per  tablet Take 1 tablet by mouth 3 (three) times daily as needed for pain. PRN up to three times daily    . umeclidinium-vilanterol (ANORO ELLIPTA) 62.5-25 MCG/INH AEPB Inhale 1 puff into the lungs daily. 60 each 3  . atorvastatin (LIPITOR) 80 MG tablet Take 1 tablet (80 mg total) by mouth daily at 6 PM. 60 tablet 3   No facility-administered medications prior to visit.     Review of Systems  Constitutional: Negative for chills, fever, malaise/fatigue and weight loss.  HENT: Negative for hearing loss, sore throat and tinnitus.   Eyes: Negative for blurred vision and double vision.  Respiratory: Positive for cough and shortness of breath. Negative for hemoptysis, sputum production, wheezing and stridor.   Cardiovascular: Negative for chest pain, palpitations, orthopnea, leg swelling and PND.       Dyspnea on exertion  Gastrointestinal: Negative for abdominal pain, constipation, diarrhea, heartburn, nausea and vomiting.  Genitourinary: Negative for dysuria, hematuria and urgency.  Musculoskeletal: Negative for joint pain and myalgias.  Skin: Negative for itching and rash.  Neurological: Negative for dizziness, tingling, weakness and headaches.  Endo/Heme/Allergies: Negative for environmental allergies. Does not bruise/bleed easily.  Psychiatric/Behavioral: Negative for depression. The patient is not nervous/anxious and does not have insomnia.   All other systems reviewed and are negative.    Objective:  Physical Exam  Constitutional: She is oriented to person, place, and time. She appears well-developed and well-nourished. No distress.  HENT:  Head: Normocephalic and atraumatic.  Mouth/Throat: Oropharynx is clear and moist.  Eyes: Pupils are equal, round, and reactive to light. Conjunctivae are normal. No scleral icterus.  Neck: Neck supple. No JVD present. No tracheal deviation present.  Cardiovascular: Normal rate, regular rhythm, normal heart sounds and intact distal pulses.  No  murmur heard. Pulmonary/Chest: Effort normal. No accessory muscle usage or stridor. No tachypnea. No respiratory distress. She has no wheezes. She has no rhonchi. She has no rales.  Posterior crackles bilaterally  Abdominal: Soft. Bowel sounds are normal. She exhibits no distension. There is no tenderness.  Musculoskeletal: She exhibits no edema or tenderness.  10 digit finger clubbing bilaterally  Lymphadenopathy:    She has no cervical adenopathy.  Neurological: She is alert and oriented to person, place, and time.  Skin: Skin is warm and dry. Capillary refill takes less than 2 seconds. No rash noted.  Psychiatric:  She has a normal mood and affect. Her behavior is normal.  Vitals reviewed.    Vitals:   08/06/18 1339  BP: (!) 142/78  Pulse: (!) 106  SpO2: 97%  Weight: 172 lb (78 kg)  Height: 5' 1" (1.549 m)   97% on RA BMI Readings from Last 3 Encounters:  08/06/18 32.50 kg/m  08/05/18 32.69 kg/m  07/30/18 32.88 kg/m   Wt Readings from Last 3 Encounters:  08/06/18 172 lb (78 kg)  08/05/18 173 lb (78.5 kg)  07/30/18 174 lb (78.9 kg)     CBC    Component Value Date/Time   WBC 7.4 06/28/2018 0428   RBC 5.07 06/28/2018 0428   HGB 13.9 06/28/2018 0428   HCT 46.0 06/28/2018 0428   PLT 290 06/28/2018 0428   MCV 90.7 06/28/2018 0428   MCV 89.0 01/01/2014 1022   MCH 27.4 06/28/2018 0428   MCHC 30.2 06/28/2018 0428   RDW 13.7 06/28/2018 0428   LYMPHSABS 2.6 06/24/2018 2325   MONOABS 0.7 06/24/2018 2325   EOSABS 0.0 06/24/2018 2325   BASOSABS 0.1 06/24/2018 2325    06/25/2018: ESR 30  rheumatoid factor negative CRP 6.4, elevated Anti-DNA antibody negative ACE negative ANA negative  hypersensitivity panel negative respiratory viral panel negative  HIV antibody negative  Chest Imaging: 06/24/2018: CT chest completed at [facility] Evidence of diffuse parenchymal lung disease with septal thickening.  Severe centrilobular emphysema.  Small bilateral  pulmonary emboli on CTA.  Mediastinal and hilar adenopathy with a 2.4 cm right lower lobe nodule/enlarged right infrahilar node.  Pulmonary Functions Testing Results: PFT Results Latest Ref Rng & Units 08/06/2018  FVC-Pre L 2.53  FVC-Predicted Pre % 86  FVC-Post L 2.52  FVC-Predicted Post % 86  Pre FEV1/FVC % % 79  Post FEV1/FCV % % 84  FEV1-Pre L 2.00  FEV1-Predicted Pre % 88  FEV1-Post L 2.11  DLCO UNC% % 46  DLCO COR %Predicted % 62  TLC L 4.18  TLC % Predicted % 90  RV % Predicted % 71    FeNO: None   Pathology: None   Echocardiogram: None   Heart Catheterization: None     Assessment & Plan:   Solitary pulmonary nodule - Plan: NM PET Image Initial (PI) Skull Base To Thigh  Mediastinal adenopathy  Hilar adenopathy  Other acute pulmonary embolism without acute cor pulmonale (HCC)  Abnormal CT scan of lung  ILD (interstitial lung disease) (HCC)  Tobacco abuse  Discussion:  This is a 62 year old female with recent hospitalization and CT imaging with evidence of diffuse parenchymal lung disease that is on top of significant bilateral centrilobular emphysema.  PFTs completed today reveal a significantly reduced DLCO.  Spirometry however appears relatively normal with a preserved ratio which I suspect is a reflection of mixed obstructive restrictive defect.  She has significant clubbing on all 10 digits of the fingers.  This is suggestive of chronic underlying lung disease.  She may very well have an underlying interstitial lung disease.  Rheumatologic work-up that was completed in the hospital was negative.  Please see labs listed above.  As for the multiple enlarged lymph nodes as well as right lower lobe infrahilar lesion seen on CT imaging I think we should proceed with pet imaging of the chest for further evaluation.  If these lesions were significantly PET avid due to this patient's smoking history as well as underlying ILD would be concern for a primary  bronchogenic carcinoma.  The patient was counseled  on the importance of obtaining the PET scan as well as considerations of for biopsy.  We discussed the risk benefits and alternatives to observation with imaging versus undergoing biopsy.  Patient agreed that if the PET imaging was concerning that we would proceed with biopsy.  Patient was counseled on smoking cessation as she is still a current smoker.  As for her small bilateral pulmonary emboli this does place her at higher risk for undergoing a procedure.  She would need to withhold anticoagulation prior to any planned biopsies.  As for the underlying ILD we would complete BAL with cell count.  Due to the patient's long-standing smoking history and IIP such as DIP may be considered as a diagnosis.  She can continue her current inhaler regimen.  Return to clinic in 2 weeks following pet imaging.  To proceed with plan.  Greater than 50% of this patient 60-minute office visit was spent face-to-face discussing and counseling regarding the above diagnostic approach as well as reviewing imaging and PFTs with the patient face-to-face.   Current Outpatient Medications:  .  albuterol (PROVENTIL HFA;VENTOLIN HFA) 108 (90 Base) MCG/ACT inhaler, Inhale 2 puffs into the lungs every 6 (six) hours as needed for wheezing or shortness of breath., Disp: 1 Inhaler, Rfl: 2 .  apixaban (ELIQUIS) 5 MG TABS tablet, 2 tab po bid x 7 days then 1 tab po bid x 3 months, Disp: 60 tablet, Rfl: 3 .  aspirin 81 MG chewable tablet, Chew 1 tablet (81 mg total) by mouth daily., Disp: 30 tablet, Rfl: 3 .  gabapentin (NEURONTIN) 600 MG tablet, Take 600 mg by mouth 4 (four) times daily. , Disp: , Rfl:  .  nicotine (NICODERM CQ - DOSED IN MG/24 HOURS) 21 mg/24hr patch, Place 1 patch (21 mg total) onto the skin daily., Disp: 30 patch, Rfl: 1 .  oxyCODONE-acetaminophen (PERCOCET) 10-325 MG per tablet, Take 1 tablet by mouth 3 (three) times daily as needed for pain. PRN up to three  times daily, Disp: , Rfl:  .  umeclidinium-vilanterol (ANORO ELLIPTA) 62.5-25 MCG/INH AEPB, Inhale 1 puff into the lungs daily., Disp: 60 each, Rfl: 3 .  atorvastatin (LIPITOR) 80 MG tablet, Take 1 tablet (80 mg total) by mouth daily at 6 PM., Disp: 60 tablet, Rfl: 3   Garner Nash, DO [facility] 08/06/2018 5:23 PM

## 2018-08-06 NOTE — Patient Instructions (Addendum)
Thank you for visiting Dr. Valeta Harms at [facility]. Today we recommend the following:  Orders Placed This Encounter  Procedures  . NM PET Image Initial (PI) Skull Base To Thigh   Return in about 2 weeks (around 08/20/2018).

## 2018-08-06 NOTE — Progress Notes (Signed)
Discussed in office visit with Dr. Valeta Harms today.  Nothing further needed at this time.

## 2018-08-06 NOTE — Progress Notes (Signed)
PFT completed today.  

## 2018-08-07 ENCOUNTER — Ambulatory Visit (INDEPENDENT_AMBULATORY_CARE_PROVIDER_SITE_OTHER): Payer: PRIVATE HEALTH INSURANCE | Admitting: Physician Assistant

## 2018-08-07 ENCOUNTER — Ambulatory Visit (INDEPENDENT_AMBULATORY_CARE_PROVIDER_SITE_OTHER): Payer: PRIVATE HEALTH INSURANCE | Admitting: Family Medicine

## 2018-08-07 ENCOUNTER — Ambulatory Visit (INDEPENDENT_AMBULATORY_CARE_PROVIDER_SITE_OTHER): Payer: PRIVATE HEALTH INSURANCE

## 2018-08-07 ENCOUNTER — Encounter: Payer: Self-pay | Admitting: Family Medicine

## 2018-08-07 VITALS — BP 124/83 | HR 93 | Temp 97.1°F | Ht 61.0 in | Wt 173.0 lb

## 2018-08-07 DIAGNOSIS — M25562 Pain in left knee: Secondary | ICD-10-CM | POA: Diagnosis not present

## 2018-08-07 DIAGNOSIS — S8992XA Unspecified injury of left lower leg, initial encounter: Secondary | ICD-10-CM | POA: Diagnosis not present

## 2018-08-07 DIAGNOSIS — W19XXXA Unspecified fall, initial encounter: Secondary | ICD-10-CM

## 2018-08-07 DIAGNOSIS — Z0000 Encounter for general adult medical examination without abnormal findings: Secondary | ICD-10-CM

## 2018-08-07 MED ORDER — METHYLPREDNISOLONE ACETATE 80 MG/ML IJ SUSP
80.0000 mg | Freq: Once | INTRAMUSCULAR | Status: AC
  Administered 2018-08-07: 80 mg via INTRAMUSCULAR

## 2018-08-07 NOTE — Patient Instructions (Signed)
  Dawn Nichols , Thank you for taking time to come for your Medicare Wellness Visit. I appreciate your ongoing commitment to your health goals. Please review the following plan we discussed and let me know if I can assist you in the future.   These are the goals we discussed: Goals    . DIET - EAT MORE FRUITS AND VEGETABLES    . Exercise 150 min/wk Moderate Activity       This is a list of the screening recommended for you and due dates:  Health Maintenance  Topic Date Due  . Tetanus Vaccine  06/03/1976  . Pap Smear  06/03/1978  . Mammogram  06/04/2007  . Colon Cancer Screening  06/04/2007  . Flu Shot  Completed  .  Hepatitis C: One time screening is recommended by Center for Disease Control  (CDC) for  adults born from [birth year] through 1964.   Completed  . HIV Screening  Completed

## 2018-08-07 NOTE — Progress Notes (Addendum)
Subjective:   Dawn Nichols is a 62 y.o. female who presents for an Initial Medicare Annual Wellness Visit. She is a very pleasant lady who presents today on a walker and appears to be in a lot of pain. She suffers from chronic back pain but states that she had a fall yesterday and hurt her left knee. She is scheduled to see a provider today after this visit to check on that. She resides in [place] with her husband. She has two sons, both who live locally and visits with them and talks with them regularly. She is currently disabled and has been so since 2013 when she had back surgery. Before becoming disabled she worked for [employer] for about 10 years. She is unable to exercise on a regular basis due to all her back problems. She has a dog at home that she enjoys. She states that she is not one to come to the Dr unless she is sick so she is overdue for a lot of health maintenance and plans to take care of all of that within the next few months. She has already spoke with Dr Lajuana Ripple about this and already has some things scheduled.  Review of Systems      Cardiac Risk Factors include: advanced age (>83men, >35 women);dyslipidemia;obesity (BMI >30kg/m2);sedentary lifestyle;smoking/ tobacco exposure     Objective:    Today's Vitals   08/07/18 0855 08/07/18 0856  BP: 124/83   Pulse: 93   Temp: (!) 97.1 F (36.2 C)   TempSrc: Oral   Weight: 173 lb (78.5 kg)   Height: 5\' 1"  (1.549 m)   PainSc:  4    Body mass index is 32.69 kg/m.  Advanced Directives 08/07/2018 06/24/2018  Does Patient Have a Medical Advance Directive? No No  Would patient like information on creating a medical advance directive? No - Patient declined No - Patient declined   Patient states that her and her husband have contacted a lawyer and are in the process of creating a will.   Current Medications (verified) Outpatient Encounter Medications as of 08/07/2018  Medication Sig  . albuterol (PROVENTIL HFA;VENTOLIN HFA)  108 (90 Base) MCG/ACT inhaler Inhale 2 puffs into the lungs every 6 (six) hours as needed for wheezing or shortness of breath.  [name] apixaban (ELIQUIS) 5 MG TABS tablet 2 tab po bid x 7 days then 1 tab po bid x 3 months  . aspirin 81 MG chewable tablet Chew 1 tablet (81 mg total) by mouth daily.  [name] gabapentin (NEURONTIN) 600 MG tablet Take 600 mg by mouth 4 (four) times daily.   . nicotine (NICODERM CQ - DOSED IN MG/24 HOURS) 21 mg/24hr patch Place 1 patch (21 mg total) onto the skin daily.  [name] oxyCODONE-acetaminophen (PERCOCET) 10-325 MG per tablet Take 1 tablet by mouth 3 (three) times daily as needed for pain. PRN up to three times daily  . umeclidinium-vilanterol (ANORO ELLIPTA) 62.5-25 MCG/INH AEPB Inhale 1 puff into the lungs daily.  [name] atorvastatin (LIPITOR) 80 MG tablet Take 1 tablet (80 mg total) by mouth daily at 6 PM.   No facility-administered encounter medications on file as of 08/07/2018.     Allergies (verified) Patient has no known allergies.   History: Past Medical History:  Diagnosis Date  . Arthritis   . CHF (congestive heart failure) ([place])   . Emphysema of lung ([place])   . Hyperlipidemia   . NSTEMI (non-ST elevated myocardial infarction) ([place]) 06/25/2018  . Tobacco use  Past Surgical History:  Procedure Laterality Date  . CESAREAN SECTION     Two  . LEFT HEART CATH AND CORONARY ANGIOGRAPHY N/A 06/26/2018   Procedure: LEFT HEART CATH AND CORONARY ANGIOGRAPHY;  Surgeon: Troy Sine, MD;  Location: [facility];  Service: Cardiovascular;  Laterality: N/A;  . SPINE SURGERY     Family History  Problem Relation Age of Onset  . Cancer Mother   . Cancer Father        Brain tumor  . Diabetes Sister    Social History   Socioeconomic History  . Marital status: Married    Spouse name: Not on file  . Number of children: 2  . Years of education: Not on file  . Highest education level: 12th grade  Occupational History  . Occupation: Disabled  Social Needs    . Financial resource strain: Not very hard  . Food insecurity:    Worry: Never true    Inability: Never true  . Transportation needs:    Medical: No    Non-medical: No  Tobacco Use  . Smoking status: Current Every Day Smoker    Packs/day: 0.50    Years: 45.00    Pack years: 22.50    Types: Cigarettes  . Smokeless tobacco: Never Used  . Tobacco comment: 4-5 cigarettes per day 11.26.19  Substance and Sexual Activity  . Alcohol use: Not Currently  . Drug use: Never  . Sexual activity: Not Currently  Lifestyle  . Physical activity:    Days per week: 0 days    Minutes per session: 0 min  . Stress: Not at all  Relationships  . Social connections:    Talks on phone: More than three times a week    Gets together: Twice a week    Attends religious service: 1 to 4 times per year    Active member of club or organization: No    Attends meetings of clubs or organizations: Never    Relationship status: Married  Other Topics Concern  . Not on file  Social History Narrative  . Not on file    Tobacco Counseling Ready to quit: Not Answered Counseling given: Not Answered Comment: 4-5 cigarettes per day 11.26.19 Patient is currently using Nicoderm patches and has decreased the number of cigarettes she smokes daily  Clinical Intake:  Pre-visit preparation completed: No  Pain : 0-10 Pain Score: 4  Pain Type: Chronic pain Pain Location: Back Pain Orientation: Lower Pain Onset: More than a month ago Pain Frequency: Constant Pain Relieving Factors: Percocet and rest Effect of Pain on Daily Activities: Slows patient down   Pain Relieving Factors: Percocet and rest  BMI - recorded: 32.52 Nutritional Status: BMI > 30  Obese Nutritional Risks: None Diabetes: No  How often do you need to have someone help you when you read instructions, pamphlets, or other written materials from your doctor or pharmacy?: 1 - Never What is the last grade level you completed in school?: 12th  grade  Interpreter Needed?: No  Information entered by :: Theodoro Clock LPN   Activities of Daily Living In your present state of health, do you have any difficulty performing the following activities: 08/07/2018 06/24/2018  Hearing? N N  Vision? Y N  Comment Wears reading glasses -  Difficulty concentrating or making decisions? N N  Walking or climbing stairs? Y N  Comment With back and knee pain -  Dressing or bathing? N N  Doing errands, shopping? Aggie Moats  Comment - Idependent until a week ago.  Preparing Food and eating ? N -  Using the Toilet? N -  In the past six months, have you accidently leaked urine? N -  Do you have problems with loss of bowel control? N -  Managing your Medications? N -  Managing your Finances? N -  Housekeeping or managing your Housekeeping? N -  Some recent data might be hidden     Immunizations and Health Maintenance Immunization History  Administered Date(s) Administered  . Influenza Whole 06/24/2018  . Influenza,inj,Quad PF,6+ Mos 07/05/2018  . Pneumococcal Polysaccharide-23 07/18/2018   Health Maintenance Due  Topic Date Due  . TETANUS/TDAP  06/03/1976  . PAP SMEAR  06/03/1978  . MAMMOGRAM  06/04/2007  . COLONOSCOPY  06/04/2007   Patient is due for several health maintenance items and states that she already has an appt for a mammogram and a pap. She states that she has already discussed all of this with Dr Lajuana Ripple and will have all this done.   Patient Care Team: [name], DO as PCP - General (Family Medicine) Elouise Munroe, MD as PCP - Cardiology (Cardiology)  Indicate any recent Medical Services you may have received from other than [facility] providers in the past year (date may be approximate).     Assessment:   This is a routine wellness examination for Dawn Nichols.  Hearing/Vision screen No exam data present  Dietary issues and exercise activities discussed: Current Exercise Habits: The patient does not participate  in regular exercise at present, Exercise limited by: cardiac condition(s);orthopedic condition(s);respiratory conditions(s)  Goals    . DIET - EAT MORE FRUITS AND VEGETABLES    . Exercise 150 min/wk Moderate Activity      Depression Screen PHQ 2/9 Scores 08/07/2018 08/07/2018 08/05/2018  PHQ - 2 Score 0 0 0  PHQ- 9 Score - - 0    Fall Risk Fall Risk  08/07/2018 08/05/2018  Falls in the past year? 1 0  Number falls in past yr: 0 -  Injury with Fall? 1 -  Comment Twisted knee -    Is the patient's home free of loose throw rugs in walkways, pet beds, electrical cords, etc?   yes      Grab bars in the bathroom? no      Handrails on the stairs?   yes      Adequate lighting?   yes    Cognitive Function: MMSE - Mini Mental State Exam 08/07/2018  Orientation to time 5  Orientation to Place 5  Registration 3  Attention/ Calculation 5  Recall 3  Language- name 2 objects 2  Language- repeat 1  Language- follow 3 step command 3  Language- read & follow direction 1  Write a sentence 1  Copy design 1  Total score 30    Patient had no problem with completing the MMSE    Screening Tests Health Maintenance  Topic Date Due  . TETANUS/TDAP  06/03/1976  . PAP SMEAR  06/03/1978  . MAMMOGRAM  06/04/2007  . COLONOSCOPY  06/04/2007  . INFLUENZA VACCINE  Completed  . Hepatitis C Screening  Completed  . HIV Screening  Completed    Qualifies for Shingles Vaccine? Will pospone at this time  Cancer Screenings: Lung: Low Dose CT Chest recommended if Age 73-80 years, 30 pack-year currently smoking OR have quit w/in 15years. Patient does qualify.  Patient is already scheduled for this procedure. Breast: Up to date on Mammogram? No  Patient  has an appt for this to be done.  Up to date of Bone Density/Dexa? No Patient will have done at follow up appt with PCP Colorectal: Patient states that she has discussed with PCP and wishes to do a Cologuard.  Additional Screenings: Hepatitis C  Screening: Done 06/25/18     Plan:     I have personally reviewed and noted the following in the patient's chart:   . Medical and social history . Use of alcohol, tobacco or illicit drugs  . Current medications and supplements . Functional ability and status . Nutritional status . Physical activity . Advanced directives . List of other physicians . Hospitalizations, surgeries, and ER visits in previous 12 months . Vitals . Screenings to include cognitive, depression, and falls . Referrals and appointments  In addition, I have reviewed and discussed with patient certain preventive protocols, quality metrics, and best practice recommendations. A written personalized care plan for preventive services as well as general preventive health recommendations were provided to patient.     Rolena Infante, LPN   [id number]   I have reviewed and agree with the above AWV documentation.   Terald Sleeper PA-C [facility] [address]  [place], [place] [address] [phone]

## 2018-08-07 NOTE — Progress Notes (Signed)
BP 124/83   Pulse 93   Temp (!) 97.1 F (36.2 C) (Oral)   Ht 5\' 1"  (1.549 m)   Wt 173 lb (78.5 kg)   BMI 32.69 kg/m    Subjective:    Patient ID: Dawn Nichols, female    DOB: 1955-10-12, 62 y.o.   MRN: [id number]  HPI: Dawn Nichols is a 62 y.o. female presenting on 08/07/2018 for Knee Pain (Left after a fall yesterday)   HPI Fall and left knee pain Patient is coming in today with complaints of a fall and left knee pain.  She says that she fell yesterday and twisted her knee and it has swollen up on her a lot and she was concerned about whether she broke anything in it.  She twisted her knee and went down and landed on that knee as well.  She does say that she is having some popping in there but it is difficult to put weight on it because of all the swelling and pain that is in there currently.  She does have some pain medication but she is been using which she has had previously and it has been helping and she has been using ice for inflammation which has been helping some but it is worse today than it was yesterday.  Relevant past medical, surgical, family and social history reviewed and updated as indicated. Interim medical history since our last visit reviewed. Allergies and medications reviewed and updated.  Review of Systems  Constitutional: Negative for chills and fever.  Eyes: Negative for visual disturbance.  Musculoskeletal: Positive for arthralgias, gait problem (Limping gait and using walker today) and joint swelling.  Skin: Negative for rash.  Neurological: Negative for light-headedness and headaches.  Psychiatric/Behavioral: Negative for agitation and behavioral problems.  All other systems reviewed and are negative.   Per HPI unless specifically indicated above        Objective:    BP 124/83   Pulse 93   Temp (!) 97.1 F (36.2 C) (Oral)   Ht 5\' 1"  (1.549 m)   Wt 173 lb (78.5 kg)   BMI 32.69 kg/m   Wt Readings from Last 3 Encounters:  08/07/18 173 lb  (78.5 kg)  08/07/18 173 lb (78.5 kg)  08/06/18 172 lb (78 kg)    Physical Exam  Constitutional: She is oriented to person, place, and time. She appears well-developed and well-nourished. No distress.  Eyes: Conjunctivae are normal.  Musculoskeletal: Normal range of motion. She exhibits no edema.       Left knee: She exhibits effusion. She exhibits normal range of motion, no deformity, normal alignment, no LCL laxity, normal patellar mobility, normal meniscus and no MCL laxity. Tenderness found. Medial joint line and lateral joint line tenderness noted.  Neurological: She is alert and oriented to person, place, and time.  Skin: Skin is warm and dry. No rash noted. She is not diaphoretic.  Nursing note and vitals reviewed.  Left knee x-ray: No signs of acute bony abnormality, osteoarthritis in both knees  Knee injection: Consent form signed. Risk factors of bleeding and infection discussed with patient and patient is agreeable towards injection. Patient prepped with Betadine. Lateral approach towards injection used. Injected 80 mg of Depo-Medrol and 1 mL of 2% lidocaine. Patient tolerated procedure well and no side effects from noted. Minimal to no bleeding. Simple bandage applied after.     Assessment & Plan:   Problem List Items Addressed This Visit    None  Visit Diagnoses    Fall, initial encounter    -  Primary   Relevant Medications   methylPREDNISolone acetate (DEPO-MEDROL) injection 80 mg (Start on 08/07/2018 10:15 AM)   Other Relevant Orders   DG Knee 1-2 Views Left (Completed)   Left knee injury, initial encounter       Difficult to assess any ligamentous injury to because of all the swelling and side and limited range of motion because of the swelling.  And effusion   Relevant Medications   methylPREDNISolone acetate (DEPO-MEDROL) injection 80 mg (Start on 08/07/2018 10:15 AM)       Follow up plan: Return if symptoms worsen or fail to improve.  Counseling provided for  all of the vaccine components Orders Placed This Encounter  Procedures  . DG Knee 1-2 Views Left    Caryl Pina, MD [facility] 08/07/2018, 10:14 AM

## 2018-08-12 ENCOUNTER — Telehealth: Payer: Self-pay | Admitting: Internal Medicine

## 2018-08-12 NOTE — Telephone Encounter (Signed)
Called patient she states that she wants to know if the Atorvastatin can cause Constipation that for the past 2 weeks she has only been going to the bathroom once weekly, she is using dulcolax, but she is unsure of how often to use that. She states she is on an opoid but it has never bothered her before now. Please advise.  Thanks!

## 2018-08-12 NOTE — Telephone Encounter (Signed)
Called patient, advised of message from PharmD. Patient verbalized understanding to call back in 2 weeks to see if any changes need to be made.

## 2018-08-12 NOTE — Telephone Encounter (Signed)
Pt c/o medication issue:  1. Name of Medication: atorvastatin (LIPITOR) 80 MG tablet  2. How are you currently taking this medication (dosage and times per day)?   Take 1 tablet (80 mg total) by mouth daily   3. Are you having a reaction (difficulty breathing--STAT)?  NO   4. What is your medication issue? Patient is calling because she is having an issue with her bowel movements.  She wants to know if this could be causing her constipation issues.

## 2018-08-12 NOTE — Telephone Encounter (Signed)
Not usually a side effect of atorvastatin (more likely to cause diarrhea), but we can stop it for a week or two until her bowels return to normal.  If not normal by 2 weeks, then it is not the atorvastatin.  Also can use a stool softener or Miralax for a day or two as well.  Would suspect it is more from chronic opioid use, but have her call back in 2 weeks as she does need to be on a cholesterol lowering medication.

## 2018-08-15 ENCOUNTER — Ambulatory Visit (HOSPITAL_COMMUNITY): Payer: 59

## 2018-08-20 ENCOUNTER — Ambulatory Visit (INDEPENDENT_AMBULATORY_CARE_PROVIDER_SITE_OTHER): Payer: PRIVATE HEALTH INSURANCE | Admitting: Family Medicine

## 2018-08-20 ENCOUNTER — Telehealth: Payer: Self-pay | Admitting: Pulmonary Disease

## 2018-08-20 ENCOUNTER — Encounter: Payer: Self-pay | Admitting: Family Medicine

## 2018-08-20 VITALS — BP 111/65 | HR 80 | Temp 97.0°F | Ht 61.0 in | Wt 176.0 lb

## 2018-08-20 DIAGNOSIS — Z0001 Encounter for general adult medical examination with abnormal findings: Secondary | ICD-10-CM | POA: Diagnosis not present

## 2018-08-20 DIAGNOSIS — D171 Benign lipomatous neoplasm of skin and subcutaneous tissue of trunk: Secondary | ICD-10-CM | POA: Diagnosis not present

## 2018-08-20 DIAGNOSIS — Z0000 Encounter for general adult medical examination without abnormal findings: Secondary | ICD-10-CM

## 2018-08-20 DIAGNOSIS — I252 Old myocardial infarction: Secondary | ICD-10-CM

## 2018-08-20 DIAGNOSIS — F17201 Nicotine dependence, unspecified, in remission: Secondary | ICD-10-CM

## 2018-08-20 DIAGNOSIS — M4135 Thoracogenic scoliosis, thoracolumbar region: Secondary | ICD-10-CM

## 2018-08-20 DIAGNOSIS — K5903 Drug induced constipation: Secondary | ICD-10-CM

## 2018-08-20 DIAGNOSIS — Z1211 Encounter for screening for malignant neoplasm of colon: Secondary | ICD-10-CM

## 2018-08-20 NOTE — Telephone Encounter (Signed)
Spoke with pt, sge states she received a phone call from radiology department called her to tell her they had to cancel her CT appt because her insurance would not cover it. They told her that she should contact us for Dr. Valeta Harms to call them. They may need a peer to peer. [facility] can you help with this?

## 2018-08-20 NOTE — Telephone Encounter (Signed)
Spoke to pt the auth was place in computer 08/19/18 and the pt received a call today I have spoken to nuc med they are trying to work out the appt and will call me back pt is aware of this  [id number]  good from 08/15/18 to 02/14/19 [place]

## 2018-08-20 NOTE — Progress Notes (Signed)
Dawn Nichols is a 62 y.o. female presents to office today for annual physical exam examination.    Concerns today include: 1. Constipation Patient reports several week history of constipation.  She contacted her specialist, who recommended she start MiraLAX.  She has been taking 1 capful in 4 ounces of water daily and this has helped regulate her stool.  In addition, she had taken a break from Lipitor, as she thought that this perhaps instigated the constipation.  During the break from Lipitor she did notice a nosebleed, which she notes she never has.  It seemed to not occur when she was on the Lipitor.  Therefore, she resumed use of Lipitor.  Denies any hematochezia, melena, hematuria or bleeding from any other orifice.  Marital status: married (husband expecting to undergo back surgery soon), Substance use: former smoker (stopped 1 week ago) Diet: fair, Exercise: no structured Last eye exam: sees regularly, wears readers. Last colonoscopy: never Last mammogram: scheduled Last pap smear: defers today "has too much going on" Refills needed today: none Immunizations needed: UTD  Past Medical History:  Diagnosis Date  . Arthritis   . CHF (congestive heart failure) ([place])   . Emphysema of lung ([place])   . Hyperlipidemia   . NSTEMI (non-ST elevated myocardial infarction) ([place]) 06/25/2018  . Tobacco use    Social History   Socioeconomic History  . Marital status: Married    Spouse name: Not on file  . Number of children: 2  . Years of education: Not on file  . Highest education level: 12th grade  Occupational History  . Occupation: Disabled  Social Needs  . Financial resource strain: Not very hard  . Food insecurity:    Worry: Never true    Inability: Never true  . Transportation needs:    Medical: No    Non-medical: No  Tobacco Use  . Smoking status: Former Smoker    Packs/day: 0.50    Years: 45.00    Pack years: 22.50    Types: Cigarettes    Last attempt to quit: 08/13/2018     Years since quitting: 0.0  . Smokeless tobacco: Never Used  . Tobacco comment: 4-5 cigarettes per day 11.26.19  Substance and Sexual Activity  . Alcohol use: Not Currently  . Drug use: Never  . Sexual activity: Not Currently  Lifestyle  . Physical activity:    Days per week: 0 days    Minutes per session: 0 min  . Stress: Not at all  Relationships  . Social connections:    Talks on phone: More than three times a week    Gets together: Twice a week    Attends religious service: 1 to 4 times per year    Active member of club or organization: No    Attends meetings of clubs or organizations: Never    Relationship status: Married  . Intimate partner violence:    Fear of current or ex partner: No    Emotionally abused: No    Physically abused: No    Forced sexual activity: No  Other Topics Concern  . Not on file  Social History Narrative  . Not on file   Past Surgical History:  Procedure Laterality Date  . CESAREAN SECTION     Two  . LEFT HEART CATH AND CORONARY ANGIOGRAPHY N/A 06/26/2018   Procedure: LEFT HEART CATH AND CORONARY ANGIOGRAPHY;  Surgeon: Troy Sine, MD;  Location: [facility];  Service: Cardiovascular;  Laterality: N/A;  .  SPINE SURGERY     Family History  Problem Relation Age of Onset  . Cancer Mother   . Cancer Father        Brain tumor  . Diabetes Sister     Current Outpatient Medications:  .  albuterol (PROVENTIL HFA;VENTOLIN HFA) 108 (90 Base) MCG/ACT inhaler, Inhale 2 puffs into the lungs every 6 (six) hours as needed for wheezing or shortness of breath., Disp: 1 Inhaler, Rfl: 2 .  apixaban (ELIQUIS) 5 MG TABS tablet, 2 tab po bid x 7 days then 1 tab po bid x 3 months, Disp: 60 tablet, Rfl: 3 .  aspirin 81 MG chewable tablet, Chew 1 tablet (81 mg total) by mouth daily., Disp: 30 tablet, Rfl: 3 .  gabapentin (NEURONTIN) 600 MG tablet, Take 600 mg by mouth 4 (four) times daily. , Disp: , Rfl:  .  oxyCODONE-acetaminophen (PERCOCET)  10-325 MG per tablet, Take 1 tablet by mouth 3 (three) times daily as needed for pain. PRN up to three times daily, Disp: , Rfl:  .  umeclidinium-vilanterol (ANORO ELLIPTA) 62.5-25 MCG/INH AEPB, Inhale 1 puff into the lungs daily., Disp: 60 each, Rfl: 3 .  atorvastatin (LIPITOR) 80 MG tablet, Take 1 tablet (80 mg total) by mouth daily at 6 PM., Disp: 60 tablet, Rfl: 3  No Known Allergies   ROS: Review of Systems Constitutional: negative Eyes: positive for contacts/glasses Ears, nose, mouth, throat, and face: positive for epistaxis and rhinorrhea Respiratory: negative Cardiovascular: negative Gastrointestinal: positive for constipation Genitourinary:negative Integument/breast: negative Hematologic/lymphatic: negative Musculoskeletal:positive for back pain and chronic 2/2 surgery and scoliosis Neurological: positive for decreased sensation to LLE (chronic after surgery) Behavioral/Psych: negative Endocrine: negative Allergic/Immunologic: negative    Physical exam BP 111/65   Pulse 80   Temp (!) 97 F (36.1 C) (Oral)   Ht '5\' 1"'  (1.549 m)   Wt 176 lb (79.8 kg)   BMI 33.25 kg/m  General appearance: alert, cooperative, appears stated age and no distress Head: Normocephalic, without obvious abnormality, atraumatic Eyes: negative findings: lids and lashes normal, conjunctivae and sclerae normal, corneas clear and pupils equal, round, reactive to light and accomodation Ears: normal TM's and external ear canals both ears Nose: Nares normal. Septum midline. Mucosa normal. No drainage or sinus tenderness. Throat: lips, mucosa, and tongue normal; teeth and gums normal Neck: no adenopathy, no JVD, supple, symmetrical, trachea midline and thyroid not enlarged, symmetric, no tenderness/mass/nodules Back: prominent scoliotic curve noted. left thoracic with prominent rib hump Lungs: mild bibasilar crackles Heart: regular rate and rhythm, S1, S2 normal, no murmur, click, rub or gallop Abdomen:  soft, non-tender; bowel sounds normal; no masses,  no organomegaly Extremities: edema trace Pulses: 2+ and symmetric Skin: Skin color, texture, turgor normal. No rashes or lesions or ~4"x3.25" lipoma noted on left flank. Lymph nodes: Cervical, supraclavicular, and axillary nodes normal. Neurologic: Grossly normal  Psych: Mood stable, speech normal, affect appropriate, pleasant and interactive Depression screen [facility] 2/9 08/20/2018 08/07/2018 08/07/2018  Decreased Interest 0 0 0  Down, Depressed, Hopeless 0 0 0  PHQ - 2 Score 0 0 0  Altered sleeping 0 - -  Tired, decreased energy 0 - -  Change in appetite 0 - -  Feeling bad or failure about yourself  0 - -  Trouble concentrating 0 - -  Moving slowly or fidgety/restless 0 - -  Suicidal thoughts 0 - -  PHQ-9 Score 0 - -  Difficult doing work/chores - - -    Assessment/ Plan: Dawn  L Nichols here for annual physical exam.   1. Well woman exam without gynecological exam Defers pap until future date.  Mammo scheduled.  Cologuard ordered for colon cancer screening.  We discussed that if positive, would result in colonoscopy recommendation.  Tetanus declined.  2. History of non-ST elevation myocardial infarction (NSTEMI) - Lipid Panel - CMP14+EGFR - TSH  3. Drug-induced constipation Controlled w/ miralax  4. Lipoma of torso Does not desire removal at this time  5. Screening for malignant neoplasm of colon As above - Cologuard  6. Tobacco use disorder, moderate, in early remission Handout provided.  I congratulated her on cessation   Handout provided on healthy lifestyle choices, including diet (rich in fruits, vegetables and lean meats and low in salt and simple carbohydrates) and exercise (at least 30 minutes of moderate physical activity daily).  Patient to follow up in 1 year for annual exam or sooner if needed.  Dawn Dominique M. Lajuana Ripple, DO

## 2018-08-20 NOTE — Telephone Encounter (Signed)
Pt scheduled for 08/21/18@7 :30am and is aware of the pet scan@wlh  Dawn Nichols

## 2018-08-20 NOTE — Patient Instructions (Addendum)
Cologuard was ordered today for colon cancer screening.  Please make sure to send this back as soon as possible.  Health Maintenance, Female Adopting a healthy lifestyle and getting preventive care can go a long way to promote health and wellness. Talk with your health care provider about what schedule of regular examinations is right for you. This is a good chance for you to check in with your provider about disease prevention and staying healthy. In between checkups, there are plenty of things you can do on your own. Experts have done a lot of research about which lifestyle changes and preventive measures are most likely to keep you healthy. Ask your health care provider for more information. Weight and diet Eat a healthy diet  Be sure to include plenty of vegetables, fruits, low-fat dairy products, and lean protein.  Do not eat a lot of foods high in solid fats, added sugars, or salt.  Get regular exercise. This is one of the most important things you can do for your health. ? Most adults should exercise for at least 150 minutes each week. The exercise should increase your heart rate and make you sweat (moderate-intensity exercise). ? Most adults should also do strengthening exercises at least twice a week. This is in addition to the moderate-intensity exercise.  Maintain a healthy weight  Body mass index (BMI) is a measurement that can be used to identify possible weight problems. It estimates body fat based on height and weight. Your health care provider can help determine your BMI and help you achieve or maintain a healthy weight.  For females 54 years of age and older: ? A BMI below 18.5 is considered underweight. ? A BMI of 18.5 to 24.9 is normal. ? A BMI of 25 to 29.9 is considered overweight. ? A BMI of 30 and above is considered obese.  Watch levels of cholesterol and blood lipids  You should start having your blood tested for lipids and cholesterol at 61 years of age, then  have this test every 5 years.  You may need to have your cholesterol levels checked more often if: ? Your lipid or cholesterol levels are high. ? You are older than 61 years of age. ? You are at high risk for heart disease.  Cancer screening Lung Cancer  Lung cancer screening is recommended for adults 29-13 years old who are at high risk for lung cancer because of a history of smoking.  A yearly low-dose CT scan of the lungs is recommended for people who: ? Currently smoke. ? Have quit within the past 15 years. ? Have at least a 30-pack-year history of smoking. A pack year is smoking an average of one pack of cigarettes a day for 1 year.  Yearly screening should continue until it has been 15 years since you quit.  Yearly screening should stop if you develop a health problem that would prevent you from having lung cancer treatment.  Breast Cancer  Practice breast self-awareness. This means understanding how your breasts normally appear and feel.  It also means doing regular breast self-exams. Let your health care provider know about any changes, no matter how small.  If you are in your 20s or 30s, you should have a clinical breast exam (CBE) by a health care provider every 1-3 years as part of a regular health exam.  If you are 33 or older, have a CBE every year. Also consider having a breast X-ray (mammogram) every year.  If you have a  family history of breast cancer, talk to your health care provider about genetic screening.  If you are at high risk for breast cancer, talk to your health care provider about having an MRI and a mammogram every year.  Breast cancer gene (BRCA) assessment is recommended for women who have family members with BRCA-related cancers. BRCA-related cancers include: ? Breast. ? Ovarian. ? Tubal. ? Peritoneal cancers.  Results of the assessment will determine the need for genetic counseling and BRCA1 and BRCA2 testing.  Cervical Cancer Your health  care provider may recommend that you be screened regularly for cancer of the pelvic organs (ovaries, uterus, and vagina). This screening involves a pelvic examination, including checking for microscopic changes to the surface of your cervix (Pap test). You may be encouraged to have this screening done every 3 years, beginning at age 62.  For women ages 58-65, health care providers may recommend pelvic exams and Pap testing every 3 years, or they may recommend the Pap and pelvic exam, combined with testing for human papilloma virus (HPV), every 5 years. Some types of HPV increase your risk of cervical cancer. Testing for HPV may also be done on women of any age with unclear Pap test results.  Other health care providers may not recommend any screening for nonpregnant women who are considered low risk for pelvic cancer and who do not have symptoms. Ask your health care provider if a screening pelvic exam is right for you.  If you have had past treatment for cervical cancer or a condition that could lead to cancer, you need Pap tests and screening for cancer for at least 20 years after your treatment. If Pap tests have been discontinued, your risk factors (such as having a new sexual partner) need to be reassessed to determine if screening should resume. Some women have medical problems that increase the chance of getting cervical cancer. In these cases, your health care provider may recommend more frequent screening and Pap tests.  Colorectal Cancer  This type of cancer can be detected and often prevented.  Routine colorectal cancer screening usually begins at 61 years of age and continues through 61 years of age.  Your health care provider may recommend screening at an earlier age if you have risk factors for colon cancer.  Your health care provider may also recommend using home test kits to check for hidden blood in the stool.  A small camera at the end of a tube can be used to examine your colon  directly (sigmoidoscopy or colonoscopy). This is done to check for the earliest forms of colorectal cancer.  Routine screening usually begins at age 76.  Direct examination of the colon should be repeated every 5-10 years through 61 years of age. However, you may need to be screened more often if early forms of precancerous polyps or small growths are found.  Skin Cancer  Check your skin from head to toe regularly.  Tell your health care provider about any new moles or changes in moles, especially if there is a change in a mole's shape or color.  Also tell your health care provider if you have a mole that is larger than the size of a pencil eraser.  Always use sunscreen. Apply sunscreen liberally and repeatedly throughout the day.  Protect yourself by wearing long sleeves, pants, a wide-brimmed hat, and sunglasses whenever you are outside.  Heart disease, diabetes, and high blood pressure  High blood pressure causes heart disease and increases the risk of  stroke. High blood pressure is more likely to develop in: ? People who have blood pressure in the high end of the normal range (130-139/85-89 mm Hg). ? People who are overweight or obese. ? People who are African American.  If you are 34-76 years of age, have your blood pressure checked every 3-5 years. If you are 28 years of age or older, have your blood pressure checked every year. You should have your blood pressure measured twice-once when you are at a hospital or clinic, and once when you are not at a hospital or clinic. Record the average of the two measurements. To check your blood pressure when you are not at a hospital or clinic, you can use: ? An automated blood pressure machine at a pharmacy. ? A home blood pressure monitor.  If you are between 33 years and 77 years old, ask your health care provider if you should take aspirin to prevent strokes.  Have regular diabetes screenings. This involves taking a blood sample to  check your fasting blood sugar level. ? If you are at a normal weight and have a low risk for diabetes, have this test once every three years after 61 years of age. ? If you are overweight and have a high risk for diabetes, consider being tested at a younger age or more often. Preventing infection Hepatitis B  If you have a higher risk for hepatitis B, you should be screened for this virus. You are considered at high risk for hepatitis B if: ? You were born in a country where hepatitis B is common. Ask your health care provider which countries are considered high risk. ? Your parents were born in a high-risk country, and you have not been immunized against hepatitis B (hepatitis B vaccine). ? You have HIV or AIDS. ? You use needles to inject street drugs. ? You live with someone who has hepatitis B. ? You have had sex with someone who has hepatitis B. ? You get hemodialysis treatment. ? You take certain medicines for conditions, including cancer, organ transplantation, and autoimmune conditions.  Hepatitis C  Blood testing is recommended for: ? Everyone born from [birth year] through 1964. ? Anyone with known risk factors for hepatitis C.  Sexually transmitted infections (STIs)  You should be screened for sexually transmitted infections (STIs) including gonorrhea and chlamydia if: ? You are sexually active and are younger than 61 years of age. ? You are older than 61 years of age and your health care provider tells you that you are at risk for this type of infection. ? Your sexual activity has changed since you were last screened and you are at an increased risk for chlamydia or gonorrhea. Ask your health care provider if you are at risk.  If you do not have HIV, but are at risk, it may be recommended that you take a prescription medicine daily to prevent HIV infection. This is called pre-exposure prophylaxis (PrEP). You are considered at risk if: ? You are sexually active and do not regularly  use condoms or know the HIV status of your partner(s). ? You take drugs by injection. ? You are sexually active with a partner who has HIV.  Talk with your health care provider about whether you are at high risk of being infected with HIV. If you choose to begin PrEP, you should first be tested for HIV. You should then be tested every 3 months for as long as you are taking PrEP. Pregnancy  If  you are premenopausal and you may become pregnant, ask your health care provider about preconception counseling.  If you may become pregnant, take 400 to 800 micrograms (mcg) of folic acid every day.  If you want to prevent pregnancy, talk to your health care provider about birth control (contraception). Osteoporosis and menopause  Osteoporosis is a disease in which the bones lose minerals and strength with aging. This can result in serious bone fractures. Your risk for osteoporosis can be identified using a bone density scan.  If you are 83 years of age or older, or if you are at risk for osteoporosis and fractures, ask your health care provider if you should be screened.  Ask your health care provider whether you should take a calcium or vitamin D supplement to lower your risk for osteoporosis.  Menopause may have certain physical symptoms and risks.  Hormone replacement therapy may reduce some of these symptoms and risks. Talk to your health care provider about whether hormone replacement therapy is right for you. Follow these instructions at home:  Schedule regular health, dental, and eye exams.  Stay current with your immunizations.  Do not use any tobacco products including cigarettes, chewing tobacco, or electronic cigarettes.  If you are pregnant, do not drink alcohol.  If you are breastfeeding, limit how much and how often you drink alcohol.  Limit alcohol intake to no more than 1 drink per day for nonpregnant women. One drink equals 12 ounces of beer, 5 ounces of wine, or 1 ounces  of hard liquor.  Do not use street drugs.  Do not share needles.  Ask your health care provider for help if you need support or information about quitting drugs.  Tell your health care provider if you often feel depressed.  Tell your health care provider if you have ever been abused or do not feel safe at home. This information is not intended to replace advice given to you by your health care provider. Make sure you discuss any questions you have with your health care provider. Document Released: 03/13/2011 Document Revised: 02/03/2016 Document Reviewed: 06/01/2015 Elsevier Interactive Patient Education  [employer].

## 2018-08-21 ENCOUNTER — Ambulatory Visit (HOSPITAL_COMMUNITY)
Admission: RE | Admit: 2018-08-21 | Discharge: 2018-08-21 | Disposition: A | Discharge status: Home / Self Care | Payer: 59 | Source: Ambulatory Visit | Attending: Pulmonary Disease | Admitting: Pulmonary Disease

## 2018-08-21 ENCOUNTER — Ambulatory Visit (HOSPITAL_COMMUNITY): Payer: 59

## 2018-08-21 DIAGNOSIS — R911 Solitary pulmonary nodule: Secondary | ICD-10-CM | POA: Diagnosis present

## 2018-08-21 LAB — CMP14+EGFR
ALBUMIN: 4.6 g/dL (ref 3.6–4.8)
ALK PHOS: 46 IU/L (ref 39–117)
ALT: 19 IU/L (ref 0–32)
AST: 20 IU/L (ref 0–40)
Albumin/Globulin Ratio: 1.7 (ref 1.2–2.2)
BILIRUBIN TOTAL: 0.5 mg/dL (ref 0.0–1.2)
BUN/Creatinine Ratio: 15 (ref 12–28)
BUN: 12 mg/dL (ref 8–27)
CHLORIDE: 102 mmol/L (ref 96–106)
CO2: 23 mmol/L (ref 20–29)
CREATININE: 0.81 mg/dL (ref 0.57–1.00)
Calcium: 9.6 mg/dL (ref 8.7–10.3)
GFR calc Af Amer: 91 mL/min/{1.73_m2} (ref >59)
GFR calc non Af Amer: 79 mL/min/{1.73_m2} (ref >59)
GLUCOSE: 102 mg/dL — AB (ref 65–99)
Globulin, Total: 2.7 g/dL (ref 1.5–4.5)
Potassium: 5.1 mmol/L (ref 3.5–5.2)
Sodium: 140 mmol/L (ref 134–144)
Total Protein: 7.3 g/dL (ref 6.0–8.5)

## 2018-08-21 LAB — TSH: TSH: 4.35 u[IU]/mL (ref 0.450–4.500)

## 2018-08-21 LAB — LIPID PANEL
CHOL/HDL RATIO: 2.1 ratio (ref 0.0–4.4)
Cholesterol, Total: 105 mg/dL (ref 100–199)
HDL: 50 mg/dL (ref >39)
LDL CALC: 43 mg/dL (ref 0–99)
Triglycerides: 62 mg/dL (ref 0–149)
VLDL CHOLESTEROL CAL: 12 mg/dL (ref 5–40)

## 2018-08-21 LAB — GLUCOSE, CAPILLARY: Glucose-Capillary: 116 mg/dL — ABNORMAL HIGH (ref 70–99)

## 2018-08-21 MED ORDER — FLUDEOXYGLUCOSE F - 18 (FDG) INJECTION
8.6300 | Freq: Once | INTRAVENOUS | Status: AC | PRN
  Administered 2018-08-21: 8.63 via INTRAVENOUS

## 2018-08-26 ENCOUNTER — Ambulatory Visit (INDEPENDENT_AMBULATORY_CARE_PROVIDER_SITE_OTHER): Payer: 59 | Admitting: Pulmonary Disease

## 2018-08-26 ENCOUNTER — Telehealth: Payer: Self-pay

## 2018-08-26 ENCOUNTER — Encounter: Payer: Self-pay | Admitting: Pulmonary Disease

## 2018-08-26 VITALS — BP 126/82 | HR 97 | Ht 61.0 in | Wt 178.4 lb

## 2018-08-26 DIAGNOSIS — E069 Thyroiditis, unspecified: Secondary | ICD-10-CM

## 2018-08-26 DIAGNOSIS — J984 Other disorders of lung: Secondary | ICD-10-CM

## 2018-08-26 DIAGNOSIS — J849 Interstitial pulmonary disease, unspecified: Secondary | ICD-10-CM | POA: Diagnosis not present

## 2018-08-26 DIAGNOSIS — J439 Emphysema, unspecified: Secondary | ICD-10-CM

## 2018-08-26 DIAGNOSIS — R590 Localized enlarged lymph nodes: Secondary | ICD-10-CM | POA: Diagnosis not present

## 2018-08-26 DIAGNOSIS — R948 Abnormal results of function studies of other organs and systems: Secondary | ICD-10-CM

## 2018-08-26 DIAGNOSIS — Z87891 Personal history of nicotine dependence: Secondary | ICD-10-CM

## 2018-08-26 NOTE — Telephone Encounter (Addendum)
-----   Message from Marshell Garfinkel, MD sent at 08/26/2018  3:08 PM EST ----- Thanks we will schedule for [facility] clinic ----- Message ----- From: Garner Nash, DO Sent: 08/26/2018  10:27 AM EST To: Marshell Garfinkel, MD  Hey guys, I set this lady up to see either of you next available in [facility] clinic. Rheum (-). + clubbing. Saw me for adenopathy. PET neg, improved. PFTs with low dlco.  Thanks Leory Plowman    Pt has pending appt with MR 09/26/17 in [facility] clinic.  Nothing further is needed.

## 2018-08-26 NOTE — Progress Notes (Signed)
Synopsis: Referred in November for SOB by [name], DO  Subjective:   PATIENT ID: Dawn Nichols GENDER: female DOB: 03-19-1956, MRN: [id number]  Chief Complaint  Patient presents with  . Follow-up    PET scan results    She is here today with complaints of SOB. She was seen in the ED recently and diagnosed with pneumonia, admitted to the hospital. Before that she was on a trip to the beach and was having difficulty walking and keeping up with her family. She was found to be hypoxic at a local urgent care and she was taken to the ED. She had positive troponin, positive BNP, CTA positive for PE and was started eliquis. CT imaging also revealed a right hilar mass and adenopathy.   Current smoker, past 45 years, 1 ppd, and recently has cut down to 4-5 per day.  Since her hospitalization she is feeling somewhat better.  She is taking her anticoagulant as directed.  She still has significant dyspnea on exertion.  Occasionally has cough.  OV 08/26/2018: Patient is seen today in follow-up after recent pet imaging.  Pet imaging reviewed which revealed improvement of her prior adenopathy.  No significant uptake within the mediastinum.  Of note she did have diffuse uptake within the thyroid.  Recent thyroid function with a normal TSH of 4.3.  This was done by her primary care provider.  She also has a planned cold guard screening which was ordered by her PCP.  Of note the pet imaging revealed abnormality with a significant SUV uptake within the ileum.  Her husband sees Dr. Yates Decamp she would like to see Dr. Ardis Hughs as well if she needs a gastroenterology referral.  Otherwise she denies any bowel change or abdominal pain.  She is noticed no blood in the stools.  She has quit smoking over the past 3 weeks.  Patient denies hemoptysis, weight loss, fever, night sweats.   Past Medical History:  Diagnosis Date  . Arthritis   . CHF (congestive heart failure) ([place])   . Emphysema of lung ([place])   .  Hyperlipidemia   . NSTEMI (non-ST elevated myocardial infarction) ([place]) 06/25/2018  . Tobacco use      Family History  Problem Relation Age of Onset  . Cancer Mother   . Cancer Father        Brain tumor  . Diabetes Sister      Past Surgical History:  Procedure Laterality Date  . CESAREAN SECTION     Two  . LEFT HEART CATH AND CORONARY ANGIOGRAPHY N/A 06/26/2018   Procedure: LEFT HEART CATH AND CORONARY ANGIOGRAPHY;  Surgeon: Troy Sine, MD;  Location: [facility];  Service: Cardiovascular;  Laterality: N/A;  . SPINE SURGERY      Social History   Socioeconomic History  . Marital status: Married    Spouse name: Not on file  . Number of children: 2  . Years of education: Not on file  . Highest education level: 12th grade  Occupational History  . Occupation: Disabled  Social Needs  . Financial resource strain: Not very hard  . Food insecurity:    Worry: Never true    Inability: Never true  . Transportation needs:    Medical: No    Non-medical: No  Tobacco Use  . Smoking status: Former Smoker    Packs/day: 0.50    Years: 45.00    Pack years: 22.50    Types: Cigarettes    Last attempt  to quit: 08/13/2018    Years since quitting: 0.0  . Smokeless tobacco: Never Used  . Tobacco comment: 4-5 cigarettes per day 11.26.19  Substance and Sexual Activity  . Alcohol use: Not Currently  . Drug use: Never  . Sexual activity: Not Currently  Lifestyle  . Physical activity:    Days per week: 0 days    Minutes per session: 0 min  . Stress: Not at all  Relationships  . Social connections:    Talks on phone: More than three times a week    Gets together: Twice a week    Attends religious service: 1 to 4 times per year    Active member of club or organization: No    Attends meetings of clubs or organizations: Never    Relationship status: Married  . Intimate partner violence:    Fear of current or ex partner: No    Emotionally abused: No    Physically abused:  No    Forced sexual activity: No  Other Topics Concern  . Not on file  Social History Narrative  . Not on file     No Known Allergies   Outpatient Medications Prior to Visit  Medication Sig Dispense Refill  . albuterol (PROVENTIL HFA;VENTOLIN HFA) 108 (90 Base) MCG/ACT inhaler Inhale 2 puffs into the lungs every 6 (six) hours as needed for wheezing or shortness of breath. 1 Inhaler 2  . apixaban (ELIQUIS) 5 MG TABS tablet 2 tab po bid x 7 days then 1 tab po bid x 3 months 60 tablet 3  . aspirin 81 MG chewable tablet Chew 1 tablet (81 mg total) by mouth daily. 30 tablet 3  . gabapentin (NEURONTIN) 600 MG tablet Take 600 mg by mouth 4 (four) times daily.     [name] oxyCODONE-acetaminophen (PERCOCET) 10-325 MG per tablet Take 1 tablet by mouth 3 (three) times daily as needed for pain. PRN up to three times daily    . umeclidinium-vilanterol (ANORO ELLIPTA) 62.5-25 MCG/INH AEPB Inhale 1 puff into the lungs daily. 60 each 3  . atorvastatin (LIPITOR) 80 MG tablet Take 1 tablet (80 mg total) by mouth daily at 6 PM. 60 tablet 3   No facility-administered medications prior to visit.     Review of Systems  Constitutional: Negative for chills, fever, malaise/fatigue and weight loss.  HENT: Negative for hearing loss, sore throat and tinnitus.   Eyes: Negative for blurred vision and double vision.  Respiratory: Positive for cough and wheezing. Negative for hemoptysis, sputum production, shortness of breath and stridor.   Cardiovascular: Negative for chest pain, palpitations, orthopnea, leg swelling and PND.  Gastrointestinal: Negative for abdominal pain, constipation, diarrhea, heartburn, nausea and vomiting.  Genitourinary: Negative for dysuria, hematuria and urgency.  Musculoskeletal: Negative for joint pain and myalgias.  Skin: Negative for itching and rash.  Neurological: Negative for dizziness, tingling, weakness and headaches.  Endo/Heme/Allergies: Negative for environmental allergies. Does not  bruise/bleed easily.  Psychiatric/Behavioral: Negative for depression. The patient is not nervous/anxious and does not have insomnia.   All other systems reviewed and are negative.   Objective:  Physical Exam Vitals signs reviewed.  Constitutional:      General: She is not in acute distress.    Appearance: She is well-developed.  HENT:     Head: Normocephalic and atraumatic.  Eyes:     General: No scleral icterus.    Conjunctiva/sclera: Conjunctivae normal.     Pupils: Pupils are equal, round, and reactive to light.  Neck:     Musculoskeletal: Neck supple.     Vascular: No JVD.     Trachea: No tracheal deviation.  Cardiovascular:     Rate and Rhythm: Normal rate and regular rhythm.     Heart sounds: Normal heart sounds. No murmur.  Pulmonary:     Effort: Pulmonary effort is normal. No tachypnea, accessory muscle usage or respiratory distress.     Breath sounds: Normal breath sounds. No stridor. No wheezing, rhonchi or rales.  Abdominal:     General: Bowel sounds are normal. There is no distension.     Palpations: Abdomen is soft.     Tenderness: There is no abdominal tenderness.  Musculoskeletal:        General: Deformity present. No tenderness.     Comments: Bilateral 10 digit finger clubbing  Lymphadenopathy:     Cervical: No cervical adenopathy.  Skin:    General: Skin is warm and dry.     Capillary Refill: Capillary refill takes less than 2 seconds.     Findings: No rash.  Neurological:     Mental Status: She is alert and oriented to person, place, and time.  Psychiatric:        Behavior: Behavior normal.      Vitals:   08/26/18 0946  BP: 126/82  Pulse: 97  SpO2: 94%  Weight: 178 lb 6.4 oz (80.9 kg)  Height: '5\' 1"'  (1.549 m)   94% on RA BMI Readings from Last 3 Encounters:  08/26/18 33.71 kg/m  08/21/18 32.50 kg/m  08/20/18 33.25 kg/m   Wt Readings from Last 3 Encounters:  08/26/18 178 lb 6.4 oz (80.9 kg)  08/21/18 172 lb (78 kg)  08/20/18 176 lb  (79.8 kg)     CBC    Component Value Date/Time   WBC 7.4 06/28/2018 0428   RBC 5.07 06/28/2018 0428   HGB 13.9 06/28/2018 0428   HCT 46.0 06/28/2018 0428   PLT 290 06/28/2018 0428   MCV 90.7 06/28/2018 0428   MCV 89.0 01/01/2014 1022   MCH 27.4 06/28/2018 0428   MCHC 30.2 06/28/2018 0428   RDW 13.7 06/28/2018 0428   LYMPHSABS 2.6 06/24/2018 2325   MONOABS 0.7 06/24/2018 2325   EOSABS 0.0 06/24/2018 2325   BASOSABS 0.1 06/24/2018 2325    06/25/2018: ESR 30  rheumatoid factor negative CRP 6.4, elevated Anti-DNA antibody negative ACE negative ANA negative  hypersensitivity panel negative respiratory viral panel negative  HIV antibody negative  Chest Imaging: 06/24/2018: CT chest completed at [facility] Evidence of diffuse parenchymal lung disease with septal thickening.  Severe centrilobular emphysema.  Small bilateral pulmonary emboli on CTA.  Mediastinal and hilar adenopathy with a 2.4 cm right lower lobe nodule/enlarged right infrahilar node.  08/21/2018: Nuclear medicine pet imaging Adenopathy from October improved on pet imaging.  Low-grade activity, mild SUV uptake. Hypermetabolic activity 2.4 x 1.9 cm within the loop of the right lower quadrant of the ileum.  Radiology recommending GI evaluation. The patient's images have been independently reviewed by me.    Pulmonary Functions Testing Results: PFT Results Latest Ref Rng & Units 08/06/2018  FVC-Pre L 2.53  FVC-Predicted Pre % 86  FVC-Post L 2.52  FVC-Predicted Post % 86  Pre FEV1/FVC % % 79  Post FEV1/FCV % % 84  FEV1-Pre L 2.00  FEV1-Predicted Pre % 88  FEV1-Post L 2.11  DLCO UNC% % 46  DLCO COR %Predicted % 62  TLC L 4.18  TLC % Predicted % 90  RV % Predicted % 71    FeNO: None   Pathology: None   Echocardiogram: None   Heart Catheterization: None     Assessment & Plan:   Mediastinal adenopathy  ILD (interstitial lung disease) (HCC)  Abnormal gastrointestinal PET scan - Plan:  Ambulatory referral to Gastroenterology  Thyroiditis - Plan: CANCELED: Thyroid Panel With TSH  Mixed restrictive and obstructive lung disease ([place])  Former smoker  Discussion:  This is a 62 year old seen initially after hospitalization with CT evidence of diffuse parenchymal lung disease as well as a significantly reduced DLCO.  PFTs with mixed ratio mixed obstructive restrictive defect.  Lung imagings on the repeat PET scan also reveals persistent groundglass opacities as well as slight septal thickening.  Also has upper lobe emphysema centrilobular bilateral.  Exam with 10 digit finger clubbing.  Prior rheumatologic work-up completed on hospitalization was negative.  Mediastinal adenopathy on recent pet imaging is improved and smaller in size as compared to prior October chest imaging.  Today we reviewed the patient's nuclear medicine imaging studies with patient as well as patient's husband in the room.  We will start with the following recommendations:  Referral to gastroenterology for evaluation of the abnormal PET imaging of the ileum. Patient would like to see Dr. Ardis Hughs.  I will let her PCP know about this referral. I will get her an appointment to see one of our interstitial lung disease physicians in the ILD clinic. Patient was congratulated on smoking cessation Patient should continue her current inhaler regimen  Greater than 50% of this patient's 40-minute office visit was spent face-to-face counseling on above recommendations treatment.   Current Outpatient Medications:  .  albuterol (PROVENTIL HFA;VENTOLIN HFA) 108 (90 Base) MCG/ACT inhaler, Inhale 2 puffs into the lungs every 6 (six) hours as needed for wheezing or shortness of breath., Disp: 1 Inhaler, Rfl: 2 .  apixaban (ELIQUIS) 5 MG TABS tablet, 2 tab po bid x 7 days then 1 tab po bid x 3 months, Disp: 60 tablet, Rfl: 3 .  aspirin 81 MG chewable tablet, Chew 1 tablet (81 mg total) by mouth daily., Disp: 30 tablet, Rfl:  3 .  gabapentin (NEURONTIN) 600 MG tablet, Take 600 mg by mouth 4 (four) times daily. , Disp: , Rfl:  .  oxyCODONE-acetaminophen (PERCOCET) 10-325 MG per tablet, Take 1 tablet by mouth 3 (three) times daily as needed for pain. PRN up to three times daily, Disp: , Rfl:  .  umeclidinium-vilanterol (ANORO ELLIPTA) 62.5-25 MCG/INH AEPB, Inhale 1 puff into the lungs daily., Disp: 60 each, Rfl: 3 .  atorvastatin (LIPITOR) 80 MG tablet, Take 1 tablet (80 mg total) by mouth daily at 6 PM., Disp: 60 tablet, Rfl: 3   Garner Nash, DO [facility] 08/26/2018 10:10 AM

## 2018-08-26 NOTE — Patient Instructions (Addendum)
Thank you for visiting Dr. Valeta Harms at [facility].  Today we recommend the following: Orders Placed This Encounter  Procedures  . Ambulatory referral to Gastroenterology   Please schedule ILD evaluation with Drs Mannam/Ramaswamy next available.   Return in about 6 months (around 02/25/2019), or if symptoms worsen or fail to improve.

## 2018-08-28 ENCOUNTER — Ambulatory Visit (INDEPENDENT_AMBULATORY_CARE_PROVIDER_SITE_OTHER): Payer: PRIVATE HEALTH INSURANCE | Admitting: Family Medicine

## 2018-08-28 ENCOUNTER — Encounter: Payer: Self-pay | Admitting: Family Medicine

## 2018-08-28 VITALS — BP 106/63 | HR 82 | Temp 97.8°F | Ht 61.0 in | Wt 175.0 lb

## 2018-08-28 DIAGNOSIS — F329 Major depressive disorder, single episode, unspecified: Secondary | ICD-10-CM | POA: Diagnosis not present

## 2018-08-28 DIAGNOSIS — Z124 Encounter for screening for malignant neoplasm of cervix: Secondary | ICD-10-CM | POA: Diagnosis not present

## 2018-08-28 DIAGNOSIS — F4329 Adjustment disorder with other symptoms: Secondary | ICD-10-CM | POA: Diagnosis not present

## 2018-08-28 DIAGNOSIS — Z01419 Encounter for gynecological examination (general) (routine) without abnormal findings: Secondary | ICD-10-CM

## 2018-08-28 MED ORDER — FLUOXETINE HCL 20 MG PO TABS: 20.0000 mg | ORAL_TABLET | Freq: Every day | ORAL | 1 refills | Status: DC

## 2018-08-28 NOTE — Progress Notes (Signed)
Subjective: CC: Pap PCP: [name], DO HPI:Dawn Nichols is a 62 y.o. female presenting to clinic today for:  Patient was seen 1 week ago for full physical exam.  At that time she had deferred the Pap smear.  She is here to have this done today.  Denies vaginal bleeding, abnormal vaginal discharge, itching or any vaginal lesions.  She is not sexually active.  She is postmenopausal.  Patient has undergone a PET scan since last visit.  Her nodules in the lungs were determined not to be malignant but there was a spot noted on her ileum and she has been referred to gastroenterology for further evaluation of this.  She reports being overwhelmed with medical issues of her own as well as medical problems in her family.  She notes that her husband is undergoing surgery tomorrow.  She also reports that her son was seriously ill recently and now has a PICC line in place for IV antibiotics.  She is not currently on any antidepressants.  She does feel like she has good support at home.  She has not sought counseling services.   ROS: Per HPI  No Known Allergies Past Medical History:  Diagnosis Date  . Arthritis   . CHF (congestive heart failure) ([place])   . Emphysema of lung ([employer])   . Hyperlipidemia   . NSTEMI (non-ST elevated myocardial infarction) ([place]) 06/25/2018  . Tobacco use     Current Outpatient Medications:  .  albuterol (PROVENTIL HFA;VENTOLIN HFA) 108 (90 Base) MCG/ACT inhaler, Inhale 2 puffs into the lungs every 6 (six) hours as needed for wheezing or shortness of breath., Disp: 1 Inhaler, Rfl: 2 .  apixaban (ELIQUIS) 5 MG TABS tablet, 2 tab po bid x 7 days then 1 tab po bid x 3 months, Disp: 60 tablet, Rfl: 3 .  aspirin 81 MG chewable tablet, Chew 1 tablet (81 mg total) by mouth daily., Disp: 30 tablet, Rfl: 3 .  gabapentin (NEURONTIN) 600 MG tablet, Take 600 mg by mouth 4 (four) times daily. , Disp: , Rfl:  .  oxyCODONE-acetaminophen (PERCOCET) 10-325 MG per tablet, Take 1  tablet by mouth every 6 (six) hours as needed for pain. PRN up to three times daily, Disp: , Rfl:  .  umeclidinium-vilanterol (ANORO ELLIPTA) 62.5-25 MCG/INH AEPB, Inhale 1 puff into the lungs daily., Disp: 60 each, Rfl: 3 .  atorvastatin (LIPITOR) 80 MG tablet, Take 1 tablet (80 mg total) by mouth daily at 6 PM., Disp: 60 tablet, Rfl: 3  Social History   Socioeconomic History  . Marital status: Married    Spouse name: Not on file  . Number of children: 2  . Years of education: Not on file  . Highest education level: 12th grade  Occupational History  . Occupation: Disabled  Social Needs  . Financial resource strain: Not very hard  . Food insecurity:    Worry: Never true    Inability: Never true  . Transportation needs:    Medical: No    Non-medical: No  Tobacco Use  . Smoking status: Former Smoker    Packs/day: 0.50    Years: 45.00    Pack years: 22.50    Types: Cigarettes    Last attempt to quit: 08/13/2018    Years since quitting: 0.0  . Smokeless tobacco: Never Used  . Tobacco comment: 4-5 cigarettes per day 11.26.19  Substance and Sexual Activity  . Alcohol use: Not Currently  . Drug use: Never  .  Sexual activity: Not Currently  Lifestyle  . Physical activity:    Days per week: 0 days    Minutes per session: 0 min  . Stress: Not at all  Relationships  . Social connections:    Talks on phone: More than three times a week    Gets together: Twice a week    Attends religious service: 1 to 4 times per year    Active member of club or organization: No    Attends meetings of clubs or organizations: Never    Relationship status: Married  . Intimate partner violence:    Fear of current or ex partner: No    Emotionally abused: No    Physically abused: No    Forced sexual activity: No  Other Topics Concern  . Not on file  Social History Narrative  . Not on file   Family History  Problem Relation Age of Onset  . Cancer Mother   . Cancer Father        Brain tumor    . Diabetes Sister     Objective: Office vital signs reviewed. BP 106/63   Pulse 82   Temp 97.8 F (36.6 C) (Oral)   Ht 5\' 1"  (1.549 m)   Wt 175 lb (79.4 kg)   BMI 33.07 kg/m   Physical Examination:  General: Awake, alert, well nourished, No acute distress GU: external vaginal tissue atrophic, cervix midline, no punctate lesions on cervix appreciated, scant white discharge from cervical os, no bleeding, no cervical motion tenderness, no abdominal/ adnexal masses Psych: Mood depressed.  Patient tearful.  Depression screen [facility] 2/9 08/28/2018 08/20/2018 08/07/2018  Decreased Interest 0 0 0  Down, Depressed, Hopeless 0 0 0  PHQ - 2 Score 0 0 0  Altered sleeping 0 0 -  Tired, decreased energy 0 0 -  Change in appetite 0 0 -  Feeling bad or failure about yourself  0 0 -  Trouble concentrating 0 0 -  Moving slowly or fidgety/restless 0 0 -  Suicidal thoughts 0 0 -  PHQ-9 Score 0 0 -  Difficult doing work/chores Not difficult at all - -   No flowsheet data found.  Assessment/ Plan: 62 y.o. female   1. Stress and adjustment reaction Despite negative PHQ 9 score, patient is having quite a bit of depressed mood.  She is currently undergoing a work-up for a mass that was noted in the ileum.  She has a sick husband at home who is getting surgery and a sick son who is undergoing IV treatment for a severe infection.  We discussed ways to help with symptoms including counseling, medicine and self coping.  Patient is interested in pursuing medication and Prozac 20 mg was prescribed today.  Handout provided.  Hotlines also provided in listed on AVS.  She will follow-up with me in about 6 weeks for recheck, sooner if needed.  I encouraged her to reach out if she needs anything.  We will continue to provide support during this patient's difficult time.  2. Reactive depression  3. Encounter for cervical Pap smear with pelvic exam - Pap IG, CT/NG NAA, and HPV (high risk) [facility]  4.  Screening for malignant neoplasm of cervix - Pap IG, CT/NG NAA, and HPV (high risk) [facility]   No orders of the defined types were placed in this encounter.  Meds ordered this encounter  Medications  . FLUoxetine (PROZAC) 20 MG tablet    Sig: Take 1 tablet (20 mg  total) by mouth daily.    Dispense:  30 tablet    Refill:  [place], DO [place] [phone]

## 2018-08-28 NOTE — Patient Instructions (Addendum)
You had labs performed today.  You will be contacted with the results of the labs once they are available, usually in the next 3 business days for routine lab work.  If you had a pap smear or biopsy performed, expect to be contacted in about 7-10 days.  Taking the medicine as directed and not missing any doses is one of the best things you can do to treat your depression.  Here are some things to keep in mind:  1) Side effects (stomach upset, some increased anxiety) may happen before you notice a benefit.  These side effects typically go away over time. 2) Changes to your dose of medicine or a change in medication all together is sometimes necessary 3) Most people need to be on medication at least 12 months 4) Many people will notice an improvement within two weeks but the full effect of the medication can take up to 4-6 weeks 5) Stopping the medication when you start feeling better often results in a return of symptoms 6) Never discontinue your medication without contacting a health care professional first.  Some medications require gradual discontinuation/ taper and can make you sick if you stop them abruptly.  If your symptoms worsen or you have thoughts of suicide/homicide, PLEASE SEEK IMMEDIATE MEDICAL ATTENTION.  You may always call:  National Suicide Hotline: 972-786-3666 [place]: 737-175-1905 Crisis Recovery in [place]: 740-474-1301   These are available 24 hours a day, 7 days a week.   Pap Test Why am I having this test? A Pap test, also called a Pap smear, is a screening test to check for signs of:  Cancer of the vagina, cervix, and uterus. The cervix is the lower part of the uterus that opens into the vagina.  Infection.  Changes that may be a sign that cancer is developing (precancerous changes). Women need this test on a regular basis. In general, you should have a Pap test every 3 years until you reach menopause or age 39. Women aged 30-60 may  choose to have their Pap test done at the same time as an HPV (human papillomavirus) test every 5 years (instead of every 3 years). Your health care provider may recommend having Pap tests more or less often depending on your medical conditions and past Pap test results. What kind of sample is taken?  Your health care provider will collect a sample of cells from the surface of your cervix. This will be done using a small cotton swab, plastic spatula, or brush. This sample is often collected during a pelvic exam, when you are lying on your back on an exam table with feet in footrests (stirrups). In some cases, fluids (secretions) from the cervix or vagina may also be collected. How do I prepare for this test?  Be aware of where you are in your menstrual cycle. If you are menstruating on the day of the test, you may be asked to reschedule.  You may need to reschedule if you have a known vaginal infection on the day of the test.  Follow instructions from your health care provider about: ? Changing or stopping your regular medicines. Some medicines can cause abnormal test results, such as digitalis and tetracycline. ? Avoiding douching or taking a bath the day before or the day of the test. Tell a health care provider about:  Any allergies you have.  All medicines you are taking, including vitamins, herbs, eye drops, creams, and over-the-counter medicines.  Any blood disorders you  have.  Any surgeries you have had.  Any medical conditions you have.  Whether you are pregnant or may be pregnant. How are the results reported? Your test results will be reported as either abnormal or normal. A false-positive result can occur. A false positive is incorrect because it means that a condition is present when it is not. A false-negative result can occur. A false negative is incorrect because it means that a condition is not present when it is. What do the results mean? A normal test result means  that you do not have signs of cancer of the vagina, cervix, or uterus. An abnormal result may mean that you have:  Cancer. A Pap test by itself is not enough to diagnose cancer. You will have more tests done in this case.  Precancerous changes in your vagina, cervix, or uterus.  Inflammation of the cervix.  An STD (sexually transmitted disease).  A fungal infection.  A parasite infection. Talk with your health care provider about what your results mean. Questions to ask your health care provider Ask your health care provider, or the department that is doing the test:  When will my results be ready?  How will I get my results?  What are my treatment options?  What other tests do I need?  What are my next steps? Summary  In general, women should have a Pap test every 3 years until they reach menopause or age 62.  Your health care provider will collect a sample of cells from the surface of your cervix. This will be done using a small cotton swab, plastic spatula, or brush.  In some cases, fluids (secretions) from the cervix or vagina may also be collected. This information is not intended to replace advice given to you by your health care provider. Make sure you discuss any questions you have with your health care provider. Document Released: 11/18/2002 Document Revised: 05/07/2017 Document Reviewed: 05/07/2017 Elsevier Interactive Patient Education  2019 [employer].

## 2018-08-29 ENCOUNTER — Inpatient Hospital Stay: Admission: RE | Admit: 2018-08-29 | Payer: 59 | Source: Ambulatory Visit

## 2018-08-30 ENCOUNTER — Telehealth: Payer: Self-pay | Admitting: Family Medicine

## 2018-08-30 NOTE — Telephone Encounter (Signed)
Please advise on new appointment for follow up testing.

## 2018-08-30 NOTE — Telephone Encounter (Signed)
Pt aware of appointment date/time 

## 2018-08-30 NOTE — Telephone Encounter (Signed)
PT has called wanting to talk to someone about mammogram place called stating that she was needing to have More images done to her left breast, we need to send her some where else to have this done.

## 2018-09-02 LAB — PAP IG, CT-NG NAA, HPV HIGH-RISK
CHLAMYDIA, NUC. ACID AMP: NEGATIVE
GONOCOCCUS BY NUCLEIC ACID AMP: NEGATIVE
HPV, HIGH-RISK: NEGATIVE

## 2018-09-03 ENCOUNTER — Encounter: Payer: Self-pay | Admitting: Gastroenterology

## 2018-09-05 ENCOUNTER — Telehealth: Payer: Self-pay | Admitting: Family Medicine

## 2018-09-05 NOTE — Telephone Encounter (Signed)
Pt had an abnormality on her PET scan in her colon so pulmonology did GI referral and she already has an appt with GI. Just wanted you to know.

## 2018-09-18 ENCOUNTER — Other Ambulatory Visit: Payer: Self-pay | Admitting: *Deleted

## 2018-09-18 LAB — HM MAMMOGRAPHY

## 2018-09-20 MED ORDER — APIXABAN 5 MG PO TABS: 5.0000 mg | ORAL_TABLET | Freq: Two times a day (BID) | ORAL | 3 refills | Status: DC

## 2018-09-26 ENCOUNTER — Encounter: Payer: Self-pay | Admitting: Internal Medicine

## 2018-09-26 ENCOUNTER — Ambulatory Visit (INDEPENDENT_AMBULATORY_CARE_PROVIDER_SITE_OTHER): Payer: 59 | Admitting: Internal Medicine

## 2018-09-26 VITALS — BP 126/80 | HR 60 | Ht 61.0 in | Wt 178.2 lb

## 2018-09-26 DIAGNOSIS — J8410 Pulmonary fibrosis, unspecified: Secondary | ICD-10-CM

## 2018-09-26 DIAGNOSIS — J439 Emphysema, unspecified: Secondary | ICD-10-CM | POA: Diagnosis not present

## 2018-09-26 DIAGNOSIS — J849 Interstitial pulmonary disease, unspecified: Secondary | ICD-10-CM

## 2018-09-26 LAB — SEDIMENTATION RATE: Sed Rate: 19 mm/hr (ref 0–30)

## 2018-09-26 NOTE — Progress Notes (Signed)
OV 09/26/2018  Subjective:  Patient ID: Dawn Nichols, female , DOB: [birth year] , age 63 y.o. , MRN: [id number] , ADDRESS: [address] [address]   09/26/2018 -   Chief Complaint  Patient presents with  . Follow-up    Referral from Dr. Valeta Harms for ILD eval.  Pt states she has been doing okay since last visit. Pt does become SOB with exertion and does wear O2 as needed with exertion. Denies any complaints of cough or CP.     HPI Dawn Nichols 63 y.o. -female referred by Dr. Leory Plowman Icard for evaluation of interstitial lung disease.  She is a former Engineer, manufacturing systems who in mid October 2019 ended up acutely respiratory ill with hypoxemia and diagnosed to have pulmonary embolism associated with emphysema and interstitial infiltrates.  She has been maintained on anticoagulation since that admission.  She also tells me that ever since then she is needed to be on oxygen 4 L with exertion now.  Prior to this acute onset of illness she was just having insidious 1 onset of shortness of breath over the last few to several months.  For example in 2018 she could walk the peer in a beach without a problem but by 2019 September she had to stop to get her breath.  During this time there is a mediastinal node seen this was PET scan positive but then the sizes shrunk so she is on observation.  She has also got intestinal lesion on the PET scan for which she is awaiting to see GI at this stage.  At this point in time overall she is stable but definitely worse than several months ago but improved compared to October 2019 admission.  Both the PET scan and CT angiogram chest in December 2019 and October 2019 respectively do show presence of ILD not otherwise specified therefore she is here.  I personally visualized those images to confirm this.  In October 2019 she had limited autoimmune profile this is negative.;  ANA, double-stranded DNA and rheumatoid factor only.   [facility] Comprehensive ILD  Questionnaire  Symptoms: Shortness of breath started suddenly approximately 3 months ago.  After the treatment of pulmonary embolism is gotten better but then plateaued.  No episodic shortness of breath.  Currently level 1 dyspnea for doing household work and taking a shower.  Level 3 dyspnea for walking up stairs and walking up a hill.  Oxygen improves it.  No cough no wheezing   Past Medical History : Positive for diagnosis of emphysema November 2019 and standard arthritis and back surgery from scoliosis in 2013.  Denies scleroderma or lupus or Sjogren's or acid reflux or sleep apnea or HIV or pulmonary hypertension or diabetes or thyroid or stroke.  No seizures.  In November/October 2019 suffered pulmonary embolism.  Has had back fusion surgery in 2013 with scoliosis.   ROS: Positive for fatigue arthralgia of back pain and rare acid reflux but otherwise no GI side effects   FAMILY HISTORY of LUNG DISEASE: No family history of lung disease   EXPOSURE HISTORY:.  Has been smoking cigarettes since 1976.  Initially she said she quit smoking but was smoking 1 pack a day.  Now 1 pack in 3 days.  No marijuana use.  No vaping no cocaine use no intravenous drug use.   HOME and HOBBY DETAILS : [place].  Age of the home is 11 years.  She is  lived there for 11 years.  There is no dampness in the house.  There is no mold or mildew in the house.  Does not use humidifier.  Does not use CPAP.  Does use oxygen with there is no mold in it.  No steam iron use.  No nebulizer use no Jacuzzi.  No fountain use.  No pet birds.  No pet gerbils.  No feather pillow feather duvet.  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  Has done machine operating work.  Has done painting and  spray painting   PULMONARY TOXICITY HISTORY (27 items): Denies      Simple office walk 250 feet x  3 laps goal with forehead probe 09/26/2018   O2 used Room air  Number laps completed 2 laps and then needed o2  Comments about pace Slow pace  Resting Pulse Ox/HR 98% and 60/min  Final Pulse Ox/HR 5% and 117/min  Desaturated </= 88% yes  Desaturated <= 3% points yes  Got Tachycardic >/= 90/min yes  Symptoms at end of test Back pain,   Miscellaneous comments Did well on 4L at 2 laps, 97% and HR 100.min    Results for Dawn Nichols, Dawn Nichols (MRN [id number]) as of 09/26/2018 11:09  Ref. Range 08/06/2018 11:38  FVC-Pre Latest Units: L 2.53  FVC-%Pred-Pre Latest Units: % 86  FEV1-Pre Latest Units: L 2.00  FEV1-%Pred-Pre Latest Units: % 88  Pre FEV1/FVC ratio Latest Units: % 79  Results for Dawn Nichols, Dawn Nichols (MRN [id number]) as of 09/26/2018 11:09  Ref. Range 08/06/2018 11:38  TLC Latest Units: L 4.18  TLC % pred Latest Units: % 90  Results for Dawn Nichols, Dawn Nichols (MRN [id number]) as of 09/26/2018 11:09  Ref. Range 08/06/2018 11:38  DLCO unc Latest Units: ml/min/mmHg 9.34  DLCO unc % pred Latest Units: % 46   Results for Dawn Nichols, Dawn Nichols (MRN [id number]) as of 09/26/2018 11:09  Ref. Range 06/25/2018 15:21  ANA Ab, IFA Unknown Negative  Angiotensin-Converting Enzyme Latest Ref Range: 14 - 82 U/L 28  ds DNA Ab Latest Ref Range: 0 - 9 IU/mL 2  RA Latex Turbid. Latest Ref Range: 0.0 - 13.9 IU/mL <10.0   ROS - per HPI     has a past medical history of Arthritis, CHF (congestive heart failure) ([place]), Emphysema of lung ([place]), Hyperlipidemia, NSTEMI (non-ST elevated myocardial infarction) ([place]) (06/25/2018), and Tobacco use.   reports that she quit smoking about 6 weeks ago. Her smoking use included cigarettes. She has a 22.50 pack-year smoking history. She has never used smokeless tobacco.  Past Surgical History:  Procedure Laterality Date  . CESAREAN SECTION     Two  . LEFT HEART CATH AND CORONARY ANGIOGRAPHY  N/A 06/26/2018   Procedure: LEFT HEART CATH AND CORONARY ANGIOGRAPHY;  Surgeon: Troy Sine, MD;  Location: [facility];  Service: Cardiovascular;  Laterality: N/A;  . SPINE SURGERY      No Known Allergies  Immunization History  Administered Date(s) Administered  . Influenza Whole 06/24/2018  . Influenza,inj,Quad PF,6+ Mos 07/05/2018  . Pneumococcal Polysaccharide-23 07/18/2018    Family History  Problem Relation Age of Onset  . Cancer Mother   . Cancer Father        Brain tumor  . Diabetes Sister  Current Outpatient Medications:  .  albuterol (PROVENTIL HFA;VENTOLIN HFA) 108 (90 Base) MCG/ACT inhaler, Inhale 2 puffs into the lungs every 6 (six) hours as needed for wheezing or shortness of breath., Disp: 1 Inhaler, Rfl: 2 .  apixaban (ELIQUIS) 5 MG TABS tablet, Take 1 tablet (5 mg total) by mouth 2 (two) times daily. 2 tab po bid x 7 days then 1 tab po bid x 3 months, Disp: 60 tablet, Rfl: 3 .  aspirin 81 MG chewable tablet, Chew 1 tablet (81 mg total) by mouth daily., Disp: 30 tablet, Rfl: 3 .  atorvastatin (LIPITOR) 80 MG tablet, Take 80 mg by mouth daily at 6 PM., Disp: , Rfl:  .  FLUoxetine (PROZAC) 20 MG tablet, Take 1 tablet (20 mg total) by mouth daily., Disp: 30 tablet, Rfl: 1 .  gabapentin (NEURONTIN) 600 MG tablet, Take 600 mg by mouth 4 (four) times daily. , Disp: , Rfl:  .  oxyCODONE-acetaminophen (PERCOCET) 10-325 MG per tablet, Take 1 tablet by mouth every 6 (six) hours as needed for pain. PRN up to three times daily, Disp: , Rfl:  .  umeclidinium-vilanterol (ANORO ELLIPTA) 62.5-25 MCG/INH AEPB, Inhale 1 puff into the lungs daily., Disp: 60 each, Rfl: 3 .  atorvastatin (LIPITOR) 80 MG tablet, Take 1 tablet (80 mg total) by mouth daily at 6 PM., Disp: 60 tablet, Rfl: 3      Objective:   Vitals:   09/26/18 1047  BP: 126/80  Pulse: 60  SpO2: 98%  Weight: 178 lb 3.2 oz (80.8 kg)  Height: _0  (1.549 m)    Estimated body mass index is 33.67  kg/m as calculated from the following:   Height as of this encounter: _1  (1.549 m).   Weight as of this encounter: 178 lb 3.2 oz (80.8 kg).  _2 @  [employer]   09/26/18 1047  Weight: 178 lb 3.2 oz (80.8 kg)     Physical Exam  General Appearance:    Alert, cooperative, no distress, appears stated age - oldere , Deconditioned looking - mild , OBESE  - yes, Sitting on Wheelchair -  no  Head:    Normocephalic, without obvious abnormality, atraumatic  Eyes:    PERRL, conjunctiva/corneas clear,  Ears:    Normal TM's and external ear canals, both ears  Nose:   Nares normal, septum midline, mucosa normal, no drainage    or sinus tenderness. OXYGEN ON  - no . Patient is @ ra at rest   Throat:   Lips, mucosa, and tongue normal; teeth and gums normal. Cyanosis on lips - n  Neck:   Supple, symmetrical, trachea midline, no adenopathy;    thyroid:  no enlargement/tenderness/nodules; no carotid   bruit or JVD  Back:     Symmetric, no curvature, ROM normal, no CVA tenderness  Lungs:     Distress - no , Wheeze no, Barrell Chest - yes, Purse lip breathing - no, Crackles - velcro crackles at bse   Chest Wall:    No tenderness or deformity.    Heart:    Regular rate and rhythm, S1 and S2 normal, no rub   or gallop, Murmur - no  Breast Exam:    NOT DONE  Abdomen:     Soft, non-tender, bowel sounds active all four quadrants,    no masses, no organomegaly. Visceral obesity - yes  Genitalia:   NOT DONE  Rectal:   NOT DONE  Extremities:   Extremities - normal, Has Cane -  no, Clubbing - YES, Edema - no  Pulses:   2+ and symmetric all extremities  Skin:   Stigmata of Connective Tissue Disease - STIGMATA of CONNECTIVE TISSUE DISEASE  - Distal digital fissuring (ie, "mechanic hands") - no - Distal digital tip ulceration - no -Inflammatory arthritis or polyarticular morning joint stiffness ?60 minutes - no - Palmar telangiectasia - no - Raynaud phenomenon - no - Unexplained digital edema  - no - Unexplained fixed rash on the digital extensor surfaces (Gottron's sign) - no ... - Deformities of RA - no - Scleroderma  - no - Malar Rash -  no   Lymph nodes:   Cervical, supraclavicular, and axillary nodes normal  Psychiatric:  Neurologic:   Pleasant - yes, Anxious - no, Flat affect - no  CAm-ICU - neg, Alert and Oriented x 3 - yes, Moves all 4s - yes, Speech - normal, Cognition - intact           Assessment:       ICD-10-CM   1. ILD (interstitial lung disease) (HCC) J84.9 Sed Rate (ESR)    Cyclic citrul peptide antibody, IgG    Sjogren's syndrome antibods(ssa + ssb)    Anti-scleroderma antibody    ANCA Screen Reflex Titer    Mpo/pr-3 (anca) antibodies    CK Total (and CKMB)    Aldolase    Hypersensitivity pnuemonitis profile    Pulmonary function test    Hypersensitivity pnuemonitis profile    Aldolase    CK Total (and CKMB)    Mpo/pr-3 (anca) antibodies    ANCA Screen Reflex Titer    Anti-scleroderma antibody    Sjogren's syndrome antibods(ssa + ssb)    Cyclic citrul peptide antibody, IgG    Sed Rate (ESR)  2. Pulmonary emphysema, unspecified emphysema type (HCC) J43.9   3. 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 Interstitial pulmonary disease ([facility]) J84.9 CT Chest High Resolution       Plan:     Patient Instructions     ICD-10-CM   1. ILD (interstitial lung disease) ([facility]) J84.9    - You have emphysema  -You also  have Interstitial Lung Disease (ILD) aka Pulmonary Fibrosis (PF)  -  There are  MANY varieties of this - of thse IPF due to velcro crackles at base, clubbing, associated emphysema and occupational hx is likely. Next likely is smoking related DIP and also chronic HP  PLAN - To narrow down possibilities and assess severity please do the following tests  -do High Resolution CT chest wo contrast (only Dr Rosario Jacks or Dr Weber Cooks or Dr Polly Cobia to read)   - do supine and prone   - do ATS 2018 protocol   - do  inspiration and expiratory phase   - do at [facility] (closer to patient home)  - Do the following blood work 09/26/2018 - : Serum: ESR,, anti-CCP, ssA, ssB, scl-70, ANCA, MPO and PR-3 antibodies, Total CK,  Aldolase,  Hypersensitivity Pneumonitis Panel   - do spirometry/dlco - next few weeks  Followup - next few weeks but after completing above; return to ILD clinic with Dr [name] ; biopsy consideration if results are indeterminate but biopsy can be risky   > 50% of this > 40 min visit spent in face to face counseling or/and coordination of care - by this undersigned MD - Dr Brand Males. This includes one or more of the following documented above: discussion of test results,  diagnostic or treatment recommendations, prognosis, risks and benefits of management options, instructions, education, compliance or risk-factor reduction      SIGNATURE    Dr. Brand Males, M.D., F.C.C.P,  Pulmonary and Critical Care Medicine Staff Physician, [place] Director - Interstitial Lung Disease  Program  Pulmonary [place] [facility] [place], [place], [address]  Pager: [phone], If no answer or between  15:00h - 7:00h: call [phone] Telephone: [phone]  12:54 PM 09/26/2018

## 2018-09-26 NOTE — Patient Instructions (Addendum)
ICD-10-CM   1. ILD (interstitial lung disease) ([place]) J84.9    - You have emphysema  -You also  have Interstitial Lung Disease (ILD) aka Pulmonary Fibrosis (PF)  -  There are  MANY varieties of this  PLAN - To narrow down possibilities and assess severity please do the following tests  -do High Resolution CT chest wo contrast (only Dr Rosario Jacks or Dr Weber Cooks or Dr Polly Cobia to read)   - do supine and prone   - do ATS 2018 protocol   - do inspiration and expiratory phase   - do at [facility] (closer to patient home)  - Do the following blood work 09/26/2018 - : Serum: ESR,, anti-CCP, ssA, ssB, scl-70, ANCA, MPO and PR-3 antibodies, Total CK,  Aldolase,  Hypersensitivity Pneumonitis Panel   - do spirometry/dlco - next few weeks  Followup - next few weeks but after completing above; return to ILD clinic with Dr [name]

## 2018-09-30 ENCOUNTER — Telehealth: Payer: Self-pay | Admitting: Internal Medicine

## 2018-09-30 ENCOUNTER — Other Ambulatory Visit: Payer: Self-pay | Admitting: Pulmonary Disease

## 2018-09-30 NOTE — Telephone Encounter (Signed)
Spoke with pt. She is aware of Dr. Juline Patch response. Per our records her PCP refilled this on 09/20/2018 #60 with 3 refills. Advised the pt to contact her pharmacy about this prescription. Nothing further was needed at this time.

## 2018-09-30 NOTE — Telephone Encounter (Signed)
PCCM: Yes, must continue until risks discussed of stopping with a provider.  [facility] 09/30/2018 3:15 PM

## 2018-09-30 NOTE — Telephone Encounter (Signed)
Called and spoke with patient, she stated that she was in the hospital at the beginning of November and was placed on Eliquis. She was told to take it for 3 months. She is wanting to know if she needs to continue taking this. Patient is being seen by [facility] but was last seen by MR with the following instructions. BI please advise, thank you.    Instructions     ICD-10-CM   1. ILD (interstitial lung disease) ([place]) J84.9    - You have emphysema  -You also  have Interstitial Lung Disease (ILD) aka Pulmonary Fibrosis (PF)  -  There are  MANY varieties of this  PLAN - To narrow down possibilities and assess severity please do the following tests             -do High Resolution CT chest wo contrast (only Dr Rosario Jacks or Dr Weber Cooks or Dr Polly Cobia to read)                         - do supine and prone                         - do ATS 2018 protocol                         - do inspiration and expiratory phase                         - do at [facility] (closer to patient home)  - Do the following blood work 09/26/2018 - : Serum: ESR,, anti-CCP, ssA, ssB, scl-70, ANCA, MPO and PR-3 antibodies, Total CK,  Aldolase,  Hypersensitivity Pneumonitis Panel   - do spirometry/dlco - next few weeks  Followup - next few weeks but after completing above; return to ILD clinic with Dr [name]

## 2018-09-30 NOTE — Telephone Encounter (Signed)
Received call report from [facility] with on patient's CK MB done on 09/26/18. CK MB results were high at 5.7.  Message routed to [name]    thank you.

## 2018-10-01 LAB — CK TOTAL AND CKMB (NOT AT ARMC)
CK TOTAL: 239 U/L — AB (ref 29–143)
CK, MB: 5.7 ng/mL — ABNORMAL HIGH (ref 0–5.0)
Relative Index: 2.4 (ref 0–4.0)

## 2018-10-01 LAB — HYPERSENSITIVITY PNUEMONITIS PROFILE
ASPERGILLUS FUMIGATUS: NEGATIVE
Faenia retivirgula: NEGATIVE
Pigeon Serum: NEGATIVE
S. VIRIDIS: NEGATIVE
T. CANDIDUS: NEGATIVE
T. VULGARIS: NEGATIVE

## 2018-10-01 LAB — SJOGREN'S SYNDROME ANTIBODS(SSA + SSB)
SSA (Ro) (ENA) Antibody, IgG: <1 AI
SSB (La) (ENA) Antibody, IgG: <1 AI

## 2018-10-01 LAB — ANCA SCREEN W REFLEX TITER
ANCA Screen: POSITIVE — AB
P-ANCA Titer: 1:160 {titer} — ABNORMAL HIGH

## 2018-10-01 LAB — ALDOLASE: Aldolase: 6.7 U/L (ref <=8.1)

## 2018-10-01 LAB — MPO/PR-3 (ANCA) ANTIBODIES
Myeloperoxidase Abs: <1 AI
Serine Protease 3: <1 AI

## 2018-10-01 LAB — ANTI-SCLERODERMA ANTIBODY: Scleroderma (Scl-70) (ENA) Antibody, IgG: <1 AI

## 2018-10-01 LAB — CYCLIC CITRUL PEPTIDE ANTIBODY, IGG

## 2018-10-02 ENCOUNTER — Telehealth: Payer: Self-pay | Admitting: Family Medicine

## 2018-10-02 NOTE — Telephone Encounter (Signed)
Please review

## 2018-10-02 NOTE — Telephone Encounter (Signed)
Called and spoke with the patient she is aware and will call her pcp . Nothing further is needed at this time

## 2018-10-02 NOTE — Telephone Encounter (Signed)
Her CK is not in rhabdomyolysis range.  Would recommend cutting in half and we recheck.  She has history of NSTEMI, so she needs something on board for cholesterol.  If persistently elevated, will need to consider an alternative, may consider Repatha vs Livalo.  Please also have her touch base with cardiology

## 2018-10-02 NOTE — Telephone Encounter (Signed)
Pt notified of recommendation Verbalizes understanding Has follow up appt on 10/09/18

## 2018-10-02 NOTE — Telephone Encounter (Signed)
She also has high ck - 200s  She needs to reduce/stop lipitor after d/w pcp Dawn Doss M, DO

## 2018-10-09 ENCOUNTER — Ambulatory Visit (INDEPENDENT_AMBULATORY_CARE_PROVIDER_SITE_OTHER): Payer: 59 | Admitting: Gastroenterology

## 2018-10-09 ENCOUNTER — Telehealth: Payer: Self-pay | Admitting: Gastroenterology

## 2018-10-09 ENCOUNTER — Encounter

## 2018-10-09 ENCOUNTER — Encounter: Payer: Self-pay | Admitting: Gastroenterology

## 2018-10-09 VITALS — BP 110/78 | HR 76 | Ht 61.0 in | Wt 179.0 lb

## 2018-10-09 DIAGNOSIS — R948 Abnormal results of function studies of other organs and systems: Secondary | ICD-10-CM

## 2018-10-09 MED ORDER — PEG 3350-KCL-NA BICARB-NACL 420 G PO SOLR: 4000.0000 mL | ORAL | 0 refills | Status: DC

## 2018-10-09 NOTE — Telephone Encounter (Signed)
[facility] Pre-operative Risk Assessment     Request for surgical clearance:     Endoscopy Procedure  What type of surgery is being performed?     colonoscopy  When is this surgery scheduled?     11/07/18  What type of clearance is required ?   Pharmacy  Are there any medications that need to be held prior to surgery and how long? Eliquis  Practice name and name of physician performing surgery?      [facility]  What is your office phone and fax number?      Phone- [phone]  [phone]  Anesthesia type (None, local, MAC, general) ?       MAC

## 2018-10-09 NOTE — Progress Notes (Signed)
HPI: This is a very pleasant 63 year old woman who was referred to me by Dr. Cindi Carbon from pulmonology  Chief complaint is PET avid nodule in her ileum  She has pulmonary fibrosis, interstitial lung disease.  She sees pulmonary [name].  October 2019 she had significant respiratory acute illness with hypoxemia and she was diagnosed to have a pulmonary embolism.  She has been on 4 L nasal cannula since then.  I am not exactly sure why but she underwent a PET scan December 2019.  This showed that the adenopathy in her chest had improved.  They were overall reactive adenopathy.  There was an incidental GI finding that suggested an ileal polyp or mass.  This measured 2.4 x 1.9 cm within a loop of right lower quadrant ileum.  It was hypermetabolic.  There "may be a small adjacent lymph node as well.   She has no significant abdominal pains.  No bowel issues.  She has never had colon cancer screening with a colonoscopy.  She has no overt GI bleeding.  She has never been told that she had an intestinal growth of any kind.  No history of cancer.  No family history of bowel cancers.  Review of systems: Pertinent positive and negative review of systems were noted in the above HPI section. All other review negative.   Past Medical History:  Diagnosis Date  . Arthritis   . CHF (congestive heart failure) ([place])   . Emphysema of lung ([place])   . Hyperlipidemia   . NSTEMI (non-ST elevated myocardial infarction) ([place]) 06/25/2018  . Tobacco use     Past Surgical History:  Procedure Laterality Date  . CESAREAN SECTION     Two  . LEFT HEART CATH AND CORONARY ANGIOGRAPHY N/A 06/26/2018   Procedure: LEFT HEART CATH AND CORONARY ANGIOGRAPHY;  Surgeon: Troy Sine, MD;  Location: [facility];  Service: Cardiovascular;  Laterality: N/A;  . SPINE SURGERY      Current Outpatient Medications  Medication Sig Dispense Refill  . albuterol (PROVENTIL HFA;VENTOLIN HFA) 108 (90 Base) MCG/ACT  inhaler Inhale 2 puffs into the lungs every 6 (six) hours as needed for wheezing or shortness of breath. 1 Inhaler 2  . apixaban (ELIQUIS) 5 MG TABS tablet Take 1 tablet (5 mg total) by mouth 2 (two) times daily. 2 tab po bid x 7 days then 1 tab po bid x 3 months 60 tablet 3  . aspirin 81 MG chewable tablet Chew 1 tablet (81 mg total) by mouth daily. 30 tablet 3  . atorvastatin (LIPITOR) 80 MG tablet Take 80 mg by mouth daily at 6 PM.    . FLUoxetine (PROZAC) 20 MG tablet Take 1 tablet (20 mg total) by mouth daily. 30 tablet 1  . gabapentin (NEURONTIN) 600 MG tablet Take 600 mg by mouth 4 (four) times daily.     [name] oxyCODONE-acetaminophen (PERCOCET) 10-325 MG per tablet Take 1 tablet by mouth every 6 (six) hours as needed for pain. PRN up to three times daily    . umeclidinium-vilanterol (ANORO ELLIPTA) 62.5-25 MCG/INH AEPB Inhale 1 puff into the lungs daily. 60 each 3   No current facility-administered medications for this visit.     Allergies as of 10/09/2018  . (No Known Allergies)    Family History  Problem Relation Age of Onset  . Cancer Mother   . Cancer Father        Brain tumor  . Diabetes Sister   . Clotting disorder Brother   .  Heart disease Maternal Aunt     Social History   Socioeconomic History  . Marital status: Married    Spouse name: Not on file  . Number of children: 2  . Years of education: Not on file  . Highest education level: 12th grade  Occupational History  . Occupation: Disabled  Social Needs  . Financial resource strain: Not very hard  . Food insecurity:    Worry: Never true    Inability: Never true  . Transportation needs:    Medical: No    Non-medical: No  Tobacco Use  . Smoking status: Former Smoker    Packs/day: 0.50    Years: 45.00    Pack years: 22.50    Types: Cigarettes    Last attempt to quit: 08/13/2018    Years since quitting: 0.1  . Smokeless tobacco: Never Used  . Tobacco comment: 4-5 cigarettes per day 11.26.19  Substance and  Sexual Activity  . Alcohol use: Not Currently  . Drug use: Never  . Sexual activity: Not Currently  Lifestyle  . Physical activity:    Days per week: 0 days    Minutes per session: 0 min  . Stress: Not at all  Relationships  . Social connections:    Talks on phone: More than three times a week    Gets together: Twice a week    Attends religious service: 1 to 4 times per year    Active member of club or organization: No    Attends meetings of clubs or organizations: Never    Relationship status: Married  . Intimate partner violence:    Fear of current or ex partner: No    Emotionally abused: No    Physically abused: No    Forced sexual activity: No  Other Topics Concern  . Not on file  Social History Narrative  . Not on file     Physical Exam: BP 110/78   Pulse 76   Ht 5\' 1"  (1.549 m)   Wt 179 lb (81.2 kg)   BMI 33.82 kg/m  Constitutional: generally well-appearing Psychiatric: alert and oriented x3 Eyes: extraocular movements intact Mouth: oral pharynx moist, no lesions Neck: supple no lymphadenopathy Cardiovascular: heart regular rate and rhythm Lungs: clear to auscultation bilaterally Abdomen: soft, nontender, nondistended, no obvious ascites, no peritoneal signs, normal bowel sounds Extremities: no lower extremity edema bilaterally Skin: no lesions on visible extremities   Assessment and plan: 63 y.o. female with incidental PET avid right lower quadrant ileal nodule  This is of unclear etiology and it is in a difficult location to reach.    I suspect this is in the distal small bowel, ileum and since it is PET avid it is possibly a neoplastic process of some sort.  She and I discussed options of where to go from here and she does understand that there is a very good chance that it will require surgical resection at some point.  We decided to go ahead with a colonoscopy first.  She is due for colon cancer screening and my plan at that time will be to intubate her  ileum as deeply as possible.  If I am able to reach the nodule a biopsy and plan to label it with tattoo as well.  If I am not able to reach the nodule I will discuss the case with 1 my partners who might be able to advance enteroscope single balloon distally into the distal small bowel.  She does understand that this  eventually might require surgical resection and would like to sit down with a surgeon as well.  We will arrange that referral.   Please see the "Patient Instructions" section for addition details about the plan.   Owens Loffler, MD [facility] 10/09/2018, 2:48 PM  Cc: Garner Nash, DO

## 2018-10-09 NOTE — Patient Instructions (Addendum)
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  You have been scheduled for a colonoscopy. Please follow written instructions given to you at your visit today.  Please pick up your prep supplies at the pharmacy within the next 1-3 days. If you use inhalers (even only as needed), please bring them with you on the day of your procedure. Your physician has requested that you go to www.startemmi.com and enter the access code given to you at your visit today. This web site gives a general overview about your procedure. However, you should still follow specific instructions given to you by our office regarding your preparation for the procedure.  Thank you for entrusting me with your care and choosing [facility].  Dr Ardis Hughs

## 2018-10-11 ENCOUNTER — Ambulatory Visit (HOSPITAL_COMMUNITY)
Admission: RE | Admit: 2018-10-11 | Discharge: 2018-10-11 | Disposition: A | Discharge status: Home / Self Care | Payer: 59 | Source: Ambulatory Visit | Attending: Internal Medicine | Admitting: Internal Medicine

## 2018-10-11 ENCOUNTER — Ambulatory Visit (INDEPENDENT_AMBULATORY_CARE_PROVIDER_SITE_OTHER): Payer: Medicare Other | Admitting: Family Medicine

## 2018-10-11 VITALS — BP 108/72 | HR 69 | Temp 97.1°F | Ht 61.0 in | Wt 181.0 lb

## 2018-10-11 DIAGNOSIS — F329 Major depressive disorder, single episode, unspecified: Secondary | ICD-10-CM

## 2018-10-11 DIAGNOSIS — F4329 Adjustment disorder with other symptoms: Secondary | ICD-10-CM

## 2018-10-11 DIAGNOSIS — I252 Old myocardial infarction: Secondary | ICD-10-CM | POA: Diagnosis not present

## 2018-10-11 DIAGNOSIS — J849 Interstitial pulmonary disease, unspecified: Secondary | ICD-10-CM | POA: Insufficient documentation

## 2018-10-11 DIAGNOSIS — R748 Abnormal levels of other serum enzymes: Secondary | ICD-10-CM

## 2018-10-11 MED ORDER — FLUOXETINE HCL 20 MG PO TABS: 20.0000 mg | ORAL_TABLET | Freq: Every day | ORAL | 1 refills | Status: DC

## 2018-10-11 NOTE — Progress Notes (Signed)
Subjective: CC: f/u depression/ elevated CK PCP: [name], DO HPI:Dawn Nichols is a 63 y.o. female presenting to clinic today for:  1.  Reactive depression/stress Patient was started on Prozac 20 mg daily at last visit for depressed mood and increased stress surrounding multiple medical issues.  She notes that symptoms have gotten somewhat better on the Prozac 20 mg.  She also reports that she has been having better test results come back.  She is tolerating the Prozac without difficulty and wishes to continue it at this time.  2.  Elevated CK Patient noted to have elevated CK about 2 weeks ago.  CK was 239, CK-MB noted to be 5.7.  Of note, history of NTEMI and pulmonary embolism. She had no chest pain, shortness of breath.  She was instructed to reduce statin 1/2 and return for recheck.  She continues to deny CP, SOB, myalgia.  She reports compliance with medications.  She has follow-up with cardiology on the 19th and there is plan for colonoscopy on the 27th of the month.  ROS: Per HPI  No Known Allergies Past Medical History:  Diagnosis Date  . Arthritis   . CHF (congestive heart failure) ([place])   . Emphysema of lung ([place])   . Hyperlipidemia   . NSTEMI (non-ST elevated myocardial infarction) ([place]) 06/25/2018  . Tobacco use     Current Outpatient Medications:  .  albuterol (PROVENTIL HFA;VENTOLIN HFA) 108 (90 Base) MCG/ACT inhaler, Inhale 2 puffs into the lungs every 6 (six) hours as needed for wheezing or shortness of breath., Disp: 1 Inhaler, Rfl: 2 .  apixaban (ELIQUIS) 5 MG TABS tablet, 2 tab po bid x 7 days then 1 tab po bid x 3 months, Disp: 60 tablet, Rfl: 3 .  aspirin 81 MG chewable tablet, Chew 1 tablet (81 mg total) by mouth daily., Disp: 30 tablet, Rfl: 3 .  gabapentin (NEURONTIN) 600 MG tablet, Take 600 mg by mouth 4 (four) times daily. , Disp: , Rfl:  .  oxyCODONE-acetaminophen (PERCOCET) 10-325 MG per tablet, Take 1 tablet by mouth every 6 (six) hours as  needed for pain. PRN up to three times daily, Disp: , Rfl:  .  umeclidinium-vilanterol (ANORO ELLIPTA) 62.5-25 MCG/INH AEPB, Inhale 1 puff into the lungs daily., Disp: 60 each, Rfl: 3 .  atorvastatin (LIPITOR) 80 MG tablet, Take 1 tablet (80 mg total) by mouth daily at 6 PM., Disp: 60 tablet, Rfl: 3  Social History   Socioeconomic History  . Marital status: Married    Spouse name: Not on file  . Number of children: 2  . Years of education: Not on file  . Highest education level: 12th grade  Occupational History  . Occupation: Disabled  Social Needs  . Financial resource strain: Not very hard  . Food insecurity:    Worry: Never true    Inability: Never true  . Transportation needs:    Medical: No    Non-medical: No  Tobacco Use  . Smoking status: Former Smoker    Packs/day: 0.50    Years: 45.00    Pack years: 22.50    Types: Cigarettes    Last attempt to quit: 08/13/2018    Years since quitting: 0.1  . Smokeless tobacco: Never Used  . Tobacco comment: 4-5 cigarettes per day 11.26.19  Substance and Sexual Activity  . Alcohol use: Not Currently  . Drug use: Never  . Sexual activity: Not Currently  Lifestyle  . Physical activity:  Days per week: 0 days    Minutes per session: 0 min  . Stress: Not at all  Relationships  . Social connections:    Talks on phone: More than three times a week    Gets together: Twice a week    Attends religious service: 1 to 4 times per year    Active member of club or organization: No    Attends meetings of clubs or organizations: Never    Relationship status: Married  . Intimate partner violence:    Fear of current or ex partner: No    Emotionally abused: No    Physically abused: No    Forced sexual activity: No  Other Topics Concern  . Not on file  Social History Narrative  . Not on file   Family History  Problem Relation Age of Onset  . Cancer Mother   . Cancer Father        Brain tumor  . Diabetes Sister   . Clotting  disorder Brother   . Heart disease Maternal Aunt     Objective: Office vital signs reviewed. BP 108/72   Pulse 69   Temp (!) 97.1 F (36.2 C) (Oral)   Ht _0  (1.549 m)   Wt 181 lb (82.1 kg)   BMI 34.20 kg/m   Physical Examination:  General: Awake, alert, well nourished, No acute distress Cardio: Regular rate and rhythm.  No murmurs.  S1-S2 heard Pulmonary: Clear to auscultation bilaterally.  No rhonchi wheezes or rales.  Normal work of breathing on room air MSK: No muscle tenderness. Psych: Mood improving but still somewhat depressed.   Depression screen [facility] 2/9 08/28/2018 08/20/2018 08/07/2018  Decreased Interest 0 0 0  Down, Depressed, Hopeless 0 0 0  PHQ - 2 Score 0 0 0  Altered sleeping 0 0 -  Tired, decreased energy 0 0 -  Change in appetite 0 0 -  Feeling bad or failure about yourself  0 0 -  Trouble concentrating 0 0 -  Moving slowly or fidgety/restless 0 0 -  Suicidal thoughts 0 0 -  PHQ-9 Score 0 0 -  Difficult doing work/chores Not difficult at all - -   GAD 7 : Generalized Anxiety Score 10/11/2018  Nervous, Anxious, on Edge 0  Control/stop worrying 0  Worry too much - different things 0  Trouble relaxing 0  Restless 1  Easily annoyed or irritable 0  Afraid - awful might happen 0  Total GAD 7 Score 1  Anxiety Difficulty Not difficult at all    Assessment/ Plan: 63 y.o. female   1. Reactive depression Stable and likely related to situation of health.  Continue Prozac 20 mg daily.  Refills have been sent  2. Stress and adjustment reaction As above  3. Elevated CK Uncertain etiology.  She has not had any viral infections recently.  She certainly is not having any myalgia.  She has chronic low back pain but this does not seem to be affecting the legs.  She did have elevated CK-MB.  Though she again remains totally asymptomatic from the cardiovascular standpoint.  I am going to copy her chart to her cardiologist for further advice as well with regards  to this.  Unsure if CK-MB would remain elevated this far out with regards to her pulmonary embolism from October.  I would favor increasing her Lipitor back to 80 mg given significant cardiovascular history.  However, will wait her cardiologist input.  Recheck CK, check CMP - CMP14+EGFR -  CK  4. History of non-ST elevation myocardial infarction (NSTEMI) As above   Orders Placed This Encounter  Procedures  . CMP14+EGFR  . CK   Meds ordered this encounter  Medications  . FLUoxetine (PROZAC) 20 MG tablet    Sig: Take 1 tablet (20 mg total) by mouth daily.    Dispense:  90 tablet    Refill:  [place], [place] [phone]

## 2018-10-11 NOTE — Patient Instructions (Signed)
You had labs performed today.  You will be contacted with the results of the labs once they are available, usually in the next 3 business days for routine lab work.  If you had a pap smear or biopsy performed, expect to be contacted in about 7-10 days.   Creatine Kinase Test Why am I having this test? The creatine kinase (CK) test is done to check for damage to muscle tissue in the body. When muscles are damaged, they release CK into the bloodstream.  This test can be used to help diagnose a heart attack or diseases of the skeletal muscles, brain, or spinal cord. What is being tested? The creatine kinase test may measure the following:  The total amount of CK in your blood (total CK).  The amount of three different forms of CK (isoenzymes) in the blood: ? CK-MM, which is found in your skeletal muscles and heart. ? CK-MB, which is found mostly in your heart. ? CK-BB, which is found mostly in your brain. What kind of sample is taken?  At least one blood sample is required for this test. It is usually collected by inserting a needle into a blood vessel. In some cases, you may need to have blood samples taken at regular intervals for up to 1 week. Tell a health care provider about:  All medicines you are taking, including vitamins, herbs, eye drops, creams, and over-the-counter medicines.  Any blood disorders you have.  Any surgeries you have had.  Any medical conditions you have.  Whether you are pregnant or may be pregnant. How are the results reported? Your results will be reported as values that indicate:  How much total CK is in your blood, given as units per liter (units/L).  How much of each measured isoenzyme is in your blood, given as a percentage. Your health care provider will compare your results to normal ranges that were established after testing a large group of people (reference values). Reference values may vary among labs and hospitals. For total CK, common  reference values are ranges that vary by age:  Adult or elderly (values are higher after exercise): ? Female: 55-170 units/L. ? Female: 30-135 units/L.  Newborn: 68-580 units/L. Reference values for each isoenzyme are:  CK-MM: 100%.  CK-MB: 0%.  CK-BB: 0%. What do the results mean? Results within reference ranges and values are normal. Levels of total CK that are higher than the reference ranges may mean that you have an injury or a disease affecting your heart, skeletal muscles, or brain. High levels of CK-MM may mean that you have:  Certain conditions affecting the skeletal muscle. A variety of conditions can lead to breakdown of skeletal muscle (rhabdomyolysis).  A recent history of surgery or injury.  Conditions that cause convulsions. These are episodes of uncontrollable movement caused by sudden, intense tightening (contraction) of the muscles. High levels of CK-MB may mean that you have:  A recent history of heart attack.  Other conditions that cause injury to the heart muscle. High levels of CK-BB may be caused by:  Taking certain psychiatric medicines.  A disease that affects the brain and spinal cord (central nervous system).  Certain types of cancer.  Injury to the lungs. Talk with your health care provider about what your results mean. Questions to ask your health care provider Ask your health care provider, or the department that is doing the test:  When will my results be ready?  How will I get my results?  What  are my treatment options?  What other tests do I need?  What are my next steps? Summary  The creatine kinase (CK) test is done to check for damage to muscle tissue in the body.  This test can be used to help diagnose a heart attack or diseases of the skeletal muscles, brain, or spinal cord.  This test involves measuring total CK and three different forms of CK in the blood.  Talk to your health care provider about what your results may  mean. This information is not intended to replace advice given to you by your health care provider. Make sure you discuss any questions you have with your health care provider. Document Released: 09/28/2004 Document Revised: 03/29/2017 Document Reviewed: 03/29/2017 Elsevier Interactive Patient Education  2019 [employer].

## 2018-10-12 LAB — CMP14+EGFR
ALT: 18 IU/L (ref 0–32)
AST: 19 IU/L (ref 0–40)
Albumin/Globulin Ratio: 1.5 (ref 1.2–2.2)
Albumin: 4.2 g/dL (ref 3.8–4.8)
Alkaline Phosphatase: 50 IU/L (ref 39–117)
BUN/Creatinine Ratio: 16 (ref 12–28)
BUN: 10 mg/dL (ref 8–27)
Bilirubin Total: 0.4 mg/dL (ref 0.0–1.2)
CO2: 24 mmol/L (ref 20–29)
Calcium: 8.9 mg/dL (ref 8.7–10.3)
Chloride: 102 mmol/L (ref 96–106)
Creatinine, Ser: 0.64 mg/dL (ref 0.57–1.00)
GFR calc Af Amer: 111 mL/min/1.73
GFR calc non Af Amer: 97 mL/min/1.73
Globulin, Total: 2.8 g/dL (ref 1.5–4.5)
Glucose: 100 mg/dL — ABNORMAL HIGH (ref 65–99)
Potassium: 4.3 mmol/L (ref 3.5–5.2)
Sodium: 142 mmol/L (ref 134–144)
Total Protein: 7 g/dL (ref 6.0–8.5)

## 2018-10-12 LAB — CK: Total CK: 236 U/L — ABNORMAL HIGH (ref 24–173)

## 2018-10-14 ENCOUNTER — Telehealth: Payer: Self-pay

## 2018-10-14 ENCOUNTER — Telehealth: Payer: Self-pay | Admitting: Family Medicine

## 2018-10-14 NOTE — Telephone Encounter (Signed)
I spoke to patient's cardiologist who recommended proceeding with holding the statin for now.  She is going to recheck her CK level at her follow-up visit on the 19th.  If CK returns to normal, she will plan for alternative therapies.  She notes that there is a statin intolerance clinic available to patient at their office.  Appreciate her recommendations.  This was discussed with patient and she voiced appreciation for call.  Ashly M. Lajuana Ripple, [facility]

## 2018-10-14 NOTE — Telephone Encounter (Signed)
-----   Message from Elouise Munroe, MD sent at 10/14/2018 10:17 AM EST ----- Spoke to Dr. Lajuana Ripple by phone, we are going to hold her statin and I will see her in 2 weeks as scheduled.

## 2018-10-14 NOTE — Telephone Encounter (Signed)
Spoke with pt. Made her aware of Dr.Acharya's recommendation. Pt sts that she did receive a call from [place] with the instruction to HOLD her Atorvastatin and keep her f/u scheduled with Dr.Acharya on 10/30/18. Pt verbalizes understanding to the instruction and voiced appreciation for the call.  Notes recorded by Elouise Munroe, MD on 10/14/2018 at 10:17 AM EST Spoke to Dr. Lajuana Ripple by phone, we are going to hold her statin and I will see her in 2 weeks as scheduled. ------  Notes recorded by Zannie Cove, LPN on 2/0/9906 at 8:93 AM EST Pt aware by VM 2/3-jhb ------  Notes recorded by [name], DO on 10/14/2018 at 7:55 AM EST CK continues to be elevated. Sugar elevated. Kidney function liver function normal. Will cc cardiology for additional recs.

## 2018-10-15 NOTE — Telephone Encounter (Signed)
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    Procedure: Endoscopy Procedure-colonoscopy Date of procedure: 11/07/2018  Due to recent PE <6 months ago, patient is at high risk. Usually would recommend 6 month of uninterrupted anticoagulation. If procedure is urgent, patient may hold apixaban for 1 day prior to procedure. CT scan done yesterday that did not show PE.

## 2018-10-16 NOTE — Telephone Encounter (Signed)
Dr Ardis Hughs, Please review the anti coag letter and advise.  Thanks, [employer]

## 2018-10-16 NOTE — Telephone Encounter (Signed)
Pt has appt with Dr. Margaretann Loveless 10/30/18

## 2018-10-18 ENCOUNTER — Telehealth: Payer: Self-pay

## 2018-10-18 NOTE — Telephone Encounter (Signed)
OK, I think it is safe to push it out a bit further (late march, early April) to allow for the 6 month window.  Can you call her to let her know;late march/early April colonoscopy, holding apixiban/eliquis for one day prior.    thanks

## 2018-10-18 NOTE — Telephone Encounter (Signed)
[facility] Pre-operative Risk Assessment     Request for surgical clearance:     Endoscopy Procedure  What type of surgery is being performed?     colonoscopy  When is this surgery scheduled?     12/05/2018  What type of clearance is required ?   Pharmacy  Are there any medications that need to be held prior to surgery and how long? Eliquis  Practice name and name of physician performing surgery?      [facility]  What is your office phone and fax number?      Phone- [phone]  [phone]  Anesthesia type (None, local, MAC, general) ?       MAC

## 2018-10-18 NOTE — Telephone Encounter (Signed)
Pt has appt with Dr. Margaretann Loveless 10/30/18. Dr. Margaretann Loveless will address preop clearance and holding Eliquis at that appt and will fax to the requesting provider. I will remove this from the preop pool.   Callback, please make requesting office aware.

## 2018-10-18 NOTE — Telephone Encounter (Signed)
Notified patient. Informed her of the cardiologist recommendations to push colonoscopy out for a later date due to start up on Eliquis less then 6 months ago. Dr Ardis Hughs aware and would suggest late March early April. Patients colonoscopy moved to 12/05/18 7:30 at [facility] anti coag letter resent,new instructions discussed over the phone with patient. All questions answered patient voiced understanding,

## 2018-10-18 NOTE — Telephone Encounter (Signed)
Routed to Dr. Margaretann Loveless.

## 2018-10-19 ENCOUNTER — Other Ambulatory Visit (HOSPITAL_COMMUNITY): Payer: Self-pay | Admitting: Family Medicine

## 2018-10-30 ENCOUNTER — Ambulatory Visit (INDEPENDENT_AMBULATORY_CARE_PROVIDER_SITE_OTHER): Payer: 59 | Admitting: Internal Medicine

## 2018-10-30 ENCOUNTER — Encounter: Payer: Self-pay | Admitting: Internal Medicine

## 2018-10-30 VITALS — BP 131/88 | HR 85 | Ht 61.0 in | Wt 182.8 lb

## 2018-10-30 DIAGNOSIS — I252 Old myocardial infarction: Secondary | ICD-10-CM | POA: Diagnosis not present

## 2018-10-30 DIAGNOSIS — I2699 Other pulmonary embolism without acute cor pulmonale: Secondary | ICD-10-CM | POA: Diagnosis not present

## 2018-10-30 DIAGNOSIS — Z720 Tobacco use: Secondary | ICD-10-CM

## 2018-10-30 DIAGNOSIS — I519 Heart disease, unspecified: Secondary | ICD-10-CM

## 2018-10-30 NOTE — Patient Instructions (Signed)
Medication Instructions:  Your physician has recommended you make the following change in your medication:  1) STOP Aspirin 2) STOP Atorvastatin  If you need a refill on your cardiac medications before your next appointment, please call your pharmacy.   Lab work: None ordered  Testing/Procedures: Your physician has requested that you have an echocardiogram. Echocardiography is a painless test that uses sound waves to create images of your heart. It provides your doctor with information about the size and shape of your heart and how well your heart's chambers and valves are working. This procedure takes approximately one hour. There are no restrictions for this procedure. (To be scheduled in May 2020)  Follow-Up: At [facility], you and your health needs are our priority.  As part of our continuing mission to provide you with exceptional heart care, we have created designated Provider Care Teams.  These Care Teams include your primary Cardiologist (physician) and Advanced Practice Providers (APPs -  Physician Assistants and Nurse Practitioners) who all work together to provide you with the care you need, when you need it. You will need a follow up appointment after the echo in May 2020.     You may see Elouise Munroe, MD or one of the following Advanced Practice Providers on your designated Care Team:   Rosaria Ferries, PA-C . Jory Sims, DNP, ANP

## 2018-10-31 ENCOUNTER — Other Ambulatory Visit: Payer: Self-pay | Admitting: Pulmonary Disease

## 2018-11-03 NOTE — Progress Notes (Signed)
Cardiology Office Note:    Date:  10/30/2018   ID:  Dawn Nichols, DOB 1956/03/25, MRN [id number]  PCP:  [name], DO  Cardiologist:  Elouise Munroe, MD  Electrophysiologist:  None   Referring MD: [name], DO   F/u NSTEMI and elevated CK  History of Present Illness:    Dawn Nichols is a 63 y.o. female with a hx of tobacco abuse, low back pain, scoliosis and peripheral neuropathy. I had originally met the patient during hospitalization with possible NSTEMI vs demand related to bilateral PE. There was a question of RV apical thrombus however this appears to have resolved on follow up echo.   She is overall well without specific cardiovascular concerns. She asks several appropriate questions about her clinical course over the past few months and I have answered these to the best of my ability.   As part of a workup for ILD, a CK was checked and was elevated. Statin dose was decreased by the patient's primary doctor, however CK remained elevated.   The patient denies chest pain, chest pressure, dyspnea at rest or with exertion, palpitations, PND, orthopnea, or leg swelling. Denies syncope or presyncope. Denies dizziness or lightheadedness.   Past Medical History:  Diagnosis Date  . Arthritis   . CHF (congestive heart failure) ([place])   . Emphysema of lung ([place])   . Hyperlipidemia   . NSTEMI (non-ST elevated myocardial infarction) ([place]) 06/25/2018  . Tobacco use     Past Surgical History:  Procedure Laterality Date  . CESAREAN SECTION     Two  . LEFT HEART CATH AND CORONARY ANGIOGRAPHY N/A 06/26/2018   Procedure: LEFT HEART CATH AND CORONARY ANGIOGRAPHY;  Surgeon: Troy Sine, MD;  Location: [facility];  Service: Cardiovascular;  Laterality: N/A;  . SPINE SURGERY      Current Medications: Current Meds  Medication Sig  . albuterol (PROVENTIL HFA;VENTOLIN HFA) 108 (90 Base) MCG/ACT inhaler Inhale 2 puffs into the lungs every 6 (six) hours as needed  for wheezing or shortness of breath.  [name] apixaban (ELIQUIS) 5 MG TABS tablet Take 1 tablet (5 mg total) by mouth 2 (two) times daily. 2 tab po bid x 7 days then 1 tab po bid x 3 months  . FLUoxetine (PROZAC) 20 MG tablet Take 1 tablet (20 mg total) by mouth daily.  [name] gabapentin (NEURONTIN) 600 MG tablet Take 600 mg by mouth 4 (four) times daily.   [name] oxyCODONE-acetaminophen (PERCOCET) 10-325 MG per tablet Take 1 tablet by mouth every 6 (six) hours as needed for pain.   . polyethylene glycol-electrolytes (NULYTELY/GOLYTELY) 420 g solution Take 4,000 mLs by mouth as directed.  . [DISCONTINUED] [employer] ASPIRIN ADULT LOW DOSE 81 MG chewable tablet CHEW 1 TABLET (81 MG TOTAL) BY MOUTH DAILY.  . [DISCONTINUED] umeclidinium-vilanterol (ANORO ELLIPTA) 62.5-25 MCG/INH AEPB Inhale 1 puff into the lungs daily.     Allergies:   Patient has no known allergies.   Social History   Socioeconomic History  . Marital status: Married    Spouse name: Not on file  . Number of children: 2  . Years of education: Not on file  . Highest education level: 12th grade  Occupational History  . Occupation: Disabled  Social Needs  . Financial resource strain: Not very hard  . Food insecurity:    Worry: Never true    Inability: Never true  . Transportation needs:    Medical: No    Non-medical:  No  Tobacco Use  . Smoking status: Former Smoker    Packs/day: 0.50    Years: 45.00    Pack years: 22.50    Types: Cigarettes    Last attempt to quit: 08/13/2018    Years since quitting: 0.2  . Smokeless tobacco: Never Used  . Tobacco comment: 4-5 cigarettes per day 11.26.19  Substance and Sexual Activity  . Alcohol use: Not Currently  . Drug use: Never  . Sexual activity: Not Currently  Lifestyle  . Physical activity:    Days per week: 0 days    Minutes per session: 0 min  . Stress: Not at all  Relationships  . Social connections:    Talks on phone: More than three times a week    Gets together: Twice a week     Attends religious service: 1 to 4 times per year    Active member of club or organization: No    Attends meetings of clubs or organizations: Never    Relationship status: Married  Other Topics Concern  . Not on file  Social History Narrative  . Not on file     Family History: The patient's family history includes Cancer in her father and mother; Clotting disorder in her brother; Diabetes in her sister; Heart disease in her maternal aunt.  ROS:   Please see the history of present illness.    All other systems reviewed and are negative.  EKGs/Labs/Other Studies Reviewed:    The following studies were reviewed today:  EKG:  No obtained today.  Recent Labs: 06/25/2018: B Natriuretic Peptide 458.3 06/27/2018: Magnesium 2.1 06/28/2018: Hemoglobin 13.9; Platelets 290 08/20/2018: TSH 4.350 10/11/2018: ALT 18; BUN 10; Creatinine, Ser 0.64; Potassium 4.3; Sodium 142  Recent Lipid Panel    Component Value Date/Time   CHOL 105 08/20/2018 0923   TRIG 62 08/20/2018 0923   HDL 50 08/20/2018 0923   CHOLHDL 2.1 08/20/2018 0923   LDLCALC 43 08/20/2018 0923    Physical Exam:    VS:  BP 131/88   Pulse 85   Ht _0  (1.549 m)   Wt 182 lb 12.8 oz (82.9 kg)   BMI 34.54 kg/m     Wt Readings from Last 3 Encounters:  10/30/18 182 lb 12.8 oz (82.9 kg)  10/11/18 181 lb (82.1 kg)  10/09/18 179 lb (81.2 kg)     Constitutional: No acute distress ENMT: moist mucous membranes Cardiovascular: regular rhythm, normal rate, no murmurs. S1 and S2 normal. Radial pulses normal bilaterally. No jugular venous distention.  Respiratory: clear to auscultation bilaterally GI : normal bowel sounds, soft and nontender. No distention.   MSK: extremities warm, well perfused. No edema.  NEURO: grossly nonfocal exam, moves all extremities. PSYCH: alert and oriented x 3, normal mood and affect.    ASSESSMENT:    1. Right ventricular dysfunction   2. History of non-ST elevation myocardial infarction  (NSTEMI)   3. Pulmonary embolism, other, unspecified chronicity, unspecified whether acute cor pulmonale present ([place])   4. Tobacco abuse    PLAN:    We will recheck an echo in May (6 mo recheck) to evaluate degree of RV dysfunction. It is unclear if she truly had an NSTEMI vs RV strain from PE with demand related troponin elevation.   Will hold Lipitor in setting of elevated CK. She has not had myalgias. CK can be rechecked at pulmonary office.   Will hold aspirin while on Eliquis, when eliquis is discontinued, ASA can be restarted.  F/u in May.   She is optimized from a cardiovascular standpoint for colonoscopy. Per GI note, can hold Eliquis one day before procedure. She is low risk for moderate sedation and intermediate risk from a cardiopulmonary standpoint for general anesthesia. Colonoscopy is a low risk procedure.   Medication Adjustments/Labs and Tests Ordered: Current medicines are reviewed at length with the patient today.  Concerns regarding medicines are outlined above.  Orders Placed This Encounter  Procedures  . ECHOCARDIOGRAM LIMITED   No orders of the defined types were placed in this encounter.   Patient Instructions  Medication Instructions:  Your physician has recommended you make the following change in your medication:  1) STOP Aspirin 2) STOP Atorvastatin  If you need a refill on your cardiac medications before your next appointment, please call your pharmacy.   Lab work: None ordered  Testing/Procedures: Your physician has requested that you have an echocardiogram. Echocardiography is a painless test that uses sound waves to create images of your heart. It provides your doctor with information about the size and shape of your heart and how well your heart's chambers and valves are working. This procedure takes approximately one hour. There are no restrictions for this procedure. (To be scheduled in May 2020)  Follow-Up: At [facility], you and your  health needs are our priority.  As part of our continuing mission to provide you with exceptional heart care, we have created designated Provider Care Teams.  These Care Teams include your primary Cardiologist (physician) and Advanced Practice Providers (APPs -  Physician Assistants and Nurse Practitioners) who all work together to provide you with the care you need, when you need it. You will need a follow up appointment after the echo in May 2020.     You may see Elouise Munroe, MD or one of the following Advanced Practice Providers on your designated Care Team:   Rosaria Ferries, PA-C . Jory Sims, DNP, ANP        Signed, Elouise Munroe, MD  10/30/2018 5:50 PM    [place]

## 2018-11-04 ENCOUNTER — Telehealth: Payer: Self-pay | Admitting: Family Medicine

## 2018-11-04 NOTE — Telephone Encounter (Signed)
appt scheduled Pt notified 

## 2018-11-05 ENCOUNTER — Encounter: Payer: Self-pay | Admitting: Family

## 2018-11-05 ENCOUNTER — Ambulatory Visit (INDEPENDENT_AMBULATORY_CARE_PROVIDER_SITE_OTHER): Payer: PRIVATE HEALTH INSURANCE | Admitting: Family

## 2018-11-05 ENCOUNTER — Telehealth: Payer: Self-pay | Admitting: Family Medicine

## 2018-11-05 VITALS — BP 115/69 | HR 78 | Temp 96.9°F | Ht 61.0 in | Wt 183.6 lb

## 2018-11-05 DIAGNOSIS — L0231 Cutaneous abscess of buttock: Secondary | ICD-10-CM | POA: Diagnosis not present

## 2018-11-05 DIAGNOSIS — B372 Candidiasis of skin and nail: Secondary | ICD-10-CM | POA: Diagnosis not present

## 2018-11-05 DIAGNOSIS — L0291 Cutaneous abscess, unspecified: Secondary | ICD-10-CM

## 2018-11-05 MED ORDER — NYSTATIN-TRIAMCINOLONE 100000-0.1 UNIT/GM-% EX OINT: 1.0000 "application " | TOPICAL_OINTMENT | Freq: Two times a day (BID) | CUTANEOUS | 0 refills | Status: DC

## 2018-11-05 MED ORDER — CEPHALEXIN 500 MG PO CAPS: 500.0000 mg | ORAL_CAPSULE | Freq: Two times a day (BID) | ORAL | 0 refills | Status: DC

## 2018-11-05 MED ORDER — NYSTATIN 100000 UNIT/GM EX POWD: Freq: Four times a day (QID) | CUTANEOUS | 0 refills | Status: DC

## 2018-11-05 NOTE — Progress Notes (Signed)
   Subjective:    Patient ID: Dawn Nichols, female    DOB: 21-Sep-1955, 63 y.o.   MRN: [id number]  Chief Complaint  Patient presents with  . swollen lump near vagina    HPI PT presents to the office today with complaints of a "boil" on her left buttocks that she noticed 3 days ago. States it is unchanged and reports intermittent pressure pain of 1 out 10. She has not done anything to it with no relief. Denies any discharge or fevers.   Complaining of rash on lower abdomen that she has had on and off for years that is worse when she sweats. She states has used OTC creams with mild relief.    Review of Systems  Skin: Positive for rash and wound.  All other systems reviewed and are negative.      Objective:   Physical Exam Vitals signs reviewed.  Constitutional:      General: She is not in acute distress.    Appearance: She is well-developed.  HENT:     Head: Normocephalic and atraumatic.  Eyes:     Pupils: Pupils are equal, round, and reactive to light.  Neck:     Musculoskeletal: Normal range of motion and neck supple.     Thyroid: No thyromegaly.  Cardiovascular:     Rate and Rhythm: Normal rate and regular rhythm.     Heart sounds: Normal heart sounds. No murmur.  Pulmonary:     Effort: Pulmonary effort is normal. No respiratory distress.     Breath sounds: Normal breath sounds. No wheezing.  Abdominal:     General: Bowel sounds are normal. There is no distension.     Palpations: Abdomen is soft.     Tenderness: There is no abdominal tenderness.  Genitourinary:      Comments: Small abscess on left groin. Musculoskeletal: Normal range of motion.        General: No tenderness.  Skin:    General: Skin is warm and dry.  Neurological:     Mental Status: She is alert and oriented to person, place, and time.     Cranial Nerves: No cranial nerve deficit.     Deep Tendon Reflexes: Reflexes are normal and symmetric.  Psychiatric:        Behavior: Behavior normal.      Thought Content: Thought content normal.        Judgment: Judgment normal.     Area cleaned and small incision made. Scant amount of blood and white discharge. Tolerated well.   BP 115/69   Pulse 78   Temp (!) 96.9 F (36.1 C) (Oral)   Ht 5\' 1"  (1.549 m)   Wt 183 lb 9.6 oz (83.3 kg)   BMI 34.69 kg/m      Assessment & Plan:  Dawn Nichols comes in today with chief complaint of swollen lump near vagina   Diagnosis and orders addressed:  1. Candidal skin infection Keep clean and dry Do not scratch  - nystatin (MYCOSTATIN/NYSTOP) powder; Apply topically 4 (four) times daily.  Dispense: 15 g; Refill: 0 - nystatin-triamcinolone ointment (MYCOLOG); Apply 1 application topically 2 (two) times daily.  Dispense: 30 g; Refill: 0  2. Abscess Warm compresses Report any increase in redness, fever, discharge, or tenderness RTO as needed  - cephALEXin (KEFLEX) 500 MG capsule; Take 1 capsule (500 mg total) by mouth 2 (two) times daily.  Dispense: 14 capsule; Refill: 0  [name], FNP

## 2018-11-05 NOTE — Telephone Encounter (Signed)
Patient aware that cream is for skin infection and antibiotic if for boil

## 2018-11-05 NOTE — Patient Instructions (Signed)

## 2018-11-06 NOTE — Telephone Encounter (Signed)
Patient was seen at Dr Delphina Cahill office on 2/19. She cleared patient to hold Eliquis for one day prior to her 12/05/18 procedure. Patient has been notified and voiced understanding.

## 2018-11-12 ENCOUNTER — Ambulatory Visit (INDEPENDENT_AMBULATORY_CARE_PROVIDER_SITE_OTHER): Payer: 59 | Admitting: Internal Medicine

## 2018-11-12 ENCOUNTER — Encounter: Payer: Self-pay | Admitting: Internal Medicine

## 2018-11-12 ENCOUNTER — Ambulatory Visit: Payer: 59 | Admitting: Internal Medicine

## 2018-11-12 VITALS — BP 140/82 | HR 74 | Ht 63.0 in | Wt 184.0 lb

## 2018-11-12 DIAGNOSIS — J849 Interstitial pulmonary disease, unspecified: Secondary | ICD-10-CM

## 2018-11-12 DIAGNOSIS — Z87891 Personal history of nicotine dependence: Secondary | ICD-10-CM | POA: Diagnosis not present

## 2018-11-12 DIAGNOSIS — J439 Emphysema, unspecified: Secondary | ICD-10-CM | POA: Diagnosis not present

## 2018-11-12 DIAGNOSIS — J84112 Idiopathic pulmonary fibrosis: Secondary | ICD-10-CM

## 2018-11-12 DIAGNOSIS — Z86711 Personal history of pulmonary embolism: Secondary | ICD-10-CM

## 2018-11-12 LAB — PULMONARY FUNCTION TEST: DL/VA % PRED: 58 %

## 2018-11-12 NOTE — Patient Instructions (Addendum)
ILD (interstitial lung disease) ([place]) IPF (idiopathic pulmonary fibrosis) ([place])  -The diagnosis I am giving you is idiopathic pulmonary fibrosis.  This is based on the pattern of CT called "probable UIP", smoking history, age 63, presence of clubbing and associated emphysema.  -  As discussed this is a progerssive disease  - In an ideal world you need lung biopsy but given fact you recently had blood clot and needing 4L  With exertion will just make a clinical diagnosis   - start ofev 150mg  twice daily with food   -Were not doing pirfenidone or Esbriet because of sunburn risk that you absolutely do not want to entertain.  We are doing nintedanib accepting marginal elevation and risk in the presence of Eliquis and will monitor your liver function and other GI side effects  - use o2 for exertona and night  -Other pillars of IPF management in the future such as rehab, research trials and transplant  Pulmonary emphysema, unspecified emphysema type ([place]) -Stable disease - continue anoro  Smoking history - glad you are working on quitting smokoing  - use e cig for now but ultimately quit  - quitting smoking is important for many reasons we discussed  History Pulmonary Embolus  - continue eliquis  Followup - 6 weeks with APP to ensure ofev tolerance

## 2018-11-12 NOTE — Progress Notes (Signed)
OV 09/26/2018  Subjective:  Patient ID: Dawn Dawn Nichols, female , DOB: May 05, 1956 , age 63 y.o. , MRN: [id number] , ADDRESS: [place] [id number]   09/26/2018 -   Chief Complaint  Patient presents with  . Follow-up    Referral from Dr. Valeta Nichols for ILD eval.  Pt states she has been doing okay since last visit. Pt does become SOB with exertion and does wear O2 as needed with exertion. Denies any complaints of cough or CP.      HPI Dawn Dawn Nichols 63 y.o. -female referred by Dr. Leory Plowman Nichols for evaluation of interstitial lung disease.  She is a former Engineer, manufacturing systems who in mid October 2019 ended up acutely respiratory ill with hypoxemia and diagnosed to have pulmonary embolism associated with emphysema and interstitial infiltrates.  She has been maintained on anticoagulation since that admission.  She also tells me that ever since then she is needed to be on oxygen 4 L with exertion now.  Prior to this acute onset of illness she was just having insidious 1 onset of shortness of breath over the last few to several months.  For example in 2018 she could walk the peer in a beach without a problem but by 2019 September she had to stop to get her breath.  During this time there is a mediastinal node seen this was PET scan positive but then the sizes shrunk so she is on observation.  She has also got intestinal lesion on the PET scan for which she is awaiting to see GI at this stage.  At this point in time overall she is stable but definitely worse than several months ago but improved compared to October 2019 admission.  Both the PET scan and CT angiogram chest in December 2019 and October 2019 respectively do show presence of ILD not otherwise specified therefore she is here.  I personally visualized those images to confirm this.  In October 2019 she had limited autoimmune profile this is negative.;  ANA, double-stranded DNA and rheumatoid factor only.   [facility] Comprehensive ILD  Questionnaire  Symptoms: Shortness of breath started suddenly approximately 3 months ago.  After the treatment of pulmonary embolism is gotten better but then plateaued.  No episodic shortness of breath.  Currently level 1 dyspnea for doing household work and taking a shower.  Level 3 dyspnea for walking up stairs and walking up a hill.  Oxygen improves it.  No cough no wheezing   Past Medical History : Positive for diagnosis of emphysema November 2019 and standard arthritis and back surgery from scoliosis in 2013.  Denies scleroderma or lupus or Sjogren's or acid reflux or sleep apnea or HIV or pulmonary hypertension or diabetes or thyroid or stroke.  No seizures.  In November/October 2019 suffered pulmonary embolism.  Has had back fusion surgery in 2013 with scoliosis.   ROS: Positive for fatigue arthralgia of back pain and rare acid reflux but otherwise no GI side effects   FAMILY HISTORY of LUNG DISEASE: No family history of lung disease   EXPOSURE HISTORY:.  Has been smoking cigarettes since 1976.  Initially she said she quit smoking but was smoking 1 pack a day.  Now 1 pack in 3 days.  No marijuana use.  No vaping no cocaine use no intravenous drug use.   HOME and HOBBY DETAILS : [place].  Age of the home is 11 years.  She  is lived there for 11 years.  There is no dampness in the house.  There is no mold or mildew in the house.  Does not use humidifier.  Does not use CPAP.  Does use oxygen with there is no mold in it.  No steam iron use.  No nebulizer use no Jacuzzi.  No fountain use.  No pet birds.  No pet gerbils.  No feather pillow feather duvet.  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  Has done machine operating work.  Has done painting and  spray painting   PULMONARY TOXICITY HISTORY (27 items): Denies         OV 11/12/2018  Subjective:  Patient ID: Dawn Dawn Nichols, female , DOB: 06/11/1956 , age 50 y.o. , MRN: [id number] , ADDRESS: [place] [id number]   11/12/2018 -   Chief Complaint  Patient presents with  . Follow-up    PFT performed today. HRCT performed 1/31.     HPI Dawn Dawn Nichols 63 y.o. -presents for ILD follow-up.  This visit is to review her ILD work-up.  She had spirometry and DLCO today and it is stable compared to November 2019.  Her oxygen desaturation test shows to stability while using 4 L of oxygen.  Subjectively she does feel stable.  We did an extended serology work-up and it is negative.  A high-resolution CT chest that I personally visualized and read by thoracic radiologist is described as probable UIP.   Her other issues 1/smoking she continues to smoke but his first Dawn Nichols to quit smoking completely.  She is aware of the need to quit smoking 2.  Emphysema: She continues on Anoro for this it is currently stable 3.  History of pulmonary embolus fall 2019: She continues Eliquis.    Results for Dawn, Nichols (MRN [id number]) as of 11/12/2018 16:04  Ref. Range 08/06/2018 11:38 11/12/2018 14:28  FVC-Pre Latest Units: L 2.53 2.82  FVC-%Pred-Pre Latest Units: % 86 89   Results for Nichols, Dawn B (MRN [id number]) as of 11/12/2018 16:04  Ref. Range 08/06/2018 11:38 11/12/2018 14:28  DLCO unc Latest Units: ml/min/mmHg 9.34 8.97  DLCO unc % pred Latest Units: % 46 46    Simple office walk 250 feet x  3 laps goal with forehead probe 09/26/2018  11/12/2018   O2 used Room air 4 L slow pace  Number laps completed 2 laps and then needed o2 3  Comments about pace Slow pace Slow pace  Resting Pulse Ox/HR 98% and 60/min 97% and 78/min  Final Pulse Ox/HR 5% and 117/min 93% and 105/min  Desaturated </= 88% yes no  Desaturated <= 3% points yes Yes, 4 points  Got Tachycardic >/= 90/min yes yes  Symptoms at end  of test Back pain,  x  Miscellaneous comments Did well on 4L at 2 laps, 97% and HR 100.min x    Results for Dawn, Nichols (MRN [id number]) as of 11/12/2018 16:04  Ref. Range 06/25/2018 15:21 09/26/2018 12:24  ASPERGILLUS FUMIGATUS Latest Ref Range: NEGATIVE   NEGATIVE  [place] Serum Latest Ref Range: NEGATIVE   NEGATIVE  ANA Ab, IFA Unknown Negative   Angiotensin-Converting Enzyme Latest Ref Range: 14 - 82 U/L 28   Cyclic Citrullin Peptide Ab Latest  Units: UNITS  <16  ds DNA Ab Latest Ref Range: 0 - 9 IU/mL 2   Myeloperoxidase Abs Latest Units: AI  <1.0  Serine Protease 3 Latest Units: AI  <1.0  RA Latex Turbid. Latest Ref Range: 0.0 - 13.9 IU/mL <10.0   SSA (Ro) (ENA) Antibody, IgG Latest Ref Range: <1.0 NEG AI  <1.0 NEG  SSB (La) (ENA) Antibody, IgG Latest Ref Range: <1.0 NEG AI  <1.0 NEG  Scleroderma (Scl-70) (ENA) Antibody, IgG Latest Ref Range: <1.0 NEG AI  <1.0 NEG     IMPRESSION HRCT  1. Dependent basilar predominant patchy confluent subpleural reticulation, ground-glass attenuation, mild traction bronchiectasis and apparent mild honeycombing. Some of the dependent opacities resolve on prone imaging, although prone assessment is incomplete. 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 Suggest follow-up high-resolution chest CT study in 6-12 months to assess temporal pattern stability, as clinically warranted. Findings are categorized as probable UIP per consensus guidelines: Diagnosis of Idiopathic Pulmonary Fibrosis: An Official ATS/ERS/JRS/ALAT Clinical Practice Guideline. [place], Iss 5, [phone], May 12 2017. 2. Mild mediastinal and right infrahilar adenopathy is decreased since 06/24/2018 chest CT, nonspecific, more likely reactive as suggested on 08/21/2018 PET-CT. 3. Two vessel coronary atherosclerosis. 4. Cystic 2.0 cm pancreatic body lesion, stable since 06/24/2018 CT. If the patient is able to adequately breath hold,  MRI abdomen without and with IV contrast is indicated for further characterization. If the patient is unable to adequately breath hold, pancreas protocol CT abdomen without and with IV contrast is suggested for further characterization.  Aortic Atherosclerosis (ICD10-I70.0) and Emphysema (ICD10-J43.9).   Electronically Signed   By: Ilona Sorrel M.D.   On: 10/14/2018 09:24  ROS - per HPI     has a past medical history of Arthritis, CHF (congestive heart failure) ([place]), Emphysema of lung ([place]), Hyperlipidemia, NSTEMI (non-ST elevated myocardial infarction) ([place]) (06/25/2018), and Tobacco use.   reports that she quit smoking about 2 months ago. Her smoking use included cigarettes. She has a 22.50 pack-year smoking history. She has never used smokeless tobacco.  Past Surgical History:  Procedure Laterality Date  . CESAREAN SECTION     Two  . LEFT HEART CATH AND CORONARY ANGIOGRAPHY N/A 06/26/2018   Procedure: LEFT HEART CATH AND CORONARY ANGIOGRAPHY;  Surgeon: Troy Sine, MD;  Location: [facility];  Service: Cardiovascular;  Laterality: N/A;  . SPINE SURGERY      No Known Allergies  Immunization History  Administered Date(s) Administered  . Influenza Whole 06/24/2018  . Influenza,inj,Quad PF,6+ Mos 07/05/2018  . Pneumococcal Polysaccharide-23 07/18/2018    Family History  Problem Relation Age of Onset  . Cancer Mother   . Cancer Father        Brain tumor  . Diabetes Sister   . Clotting disorder Brother   . Heart disease Maternal Aunt      Current Outpatient Medications:  .  albuterol (PROVENTIL HFA;VENTOLIN HFA) 108 (90 Base) MCG/ACT inhaler, Inhale 2 puffs into the lungs every 6 (six) hours as needed for wheezing or shortness of breath., Disp: 1 Inhaler, Rfl: 2 .  ANORO ELLIPTA 62.5-25 MCG/INH AEPB, TAKE 1 PUFF BY MOUTH EVERY DAY, Disp: 60 each, Rfl: 3 .  apixaban (ELIQUIS) 5 MG TABS tablet, Take 1 tablet (5 mg total) by mouth 2 (two) times daily. 2  tab po bid x 7 days then 1 tab po bid x 3 months, Disp: 60 tablet, Rfl: 3 .  cephALEXin (KEFLEX) 500 MG capsule, Take 1 capsule (500 mg total) by mouth 2 (two) times daily., Disp: 14 capsule, Rfl: 0 .  FLUoxetine (PROZAC) 20 MG tablet, Take 1 tablet (20 mg total) by mouth daily., Disp: 90 tablet, Rfl: 1 .  gabapentin (NEURONTIN) 600 MG tablet, Take 600 mg by mouth 4 (four) times daily. , Disp: , Rfl:  .  nystatin (MYCOSTATIN/NYSTOP) powder, Apply topically 4 (four) times daily., Disp: 15 g, Rfl: 0 .  nystatin-triamcinolone ointment (MYCOLOG), Apply 1 application topically 2 (two) times daily., Disp: 30 g, Rfl: 0 .  oxyCODONE-acetaminophen (PERCOCET) 10-325 MG per tablet, Take 1 tablet by mouth every 6 (six) hours as needed for pain. , Disp: , Rfl:  .  polyethylene glycol-electrolytes (NULYTELY/GOLYTELY) 420 g solution, Take 4,000 mLs by mouth as directed., Disp: 4000 mL, Rfl: 0      Objective:   Vitals:   11/12/18 1522  BP: 140/82  Pulse: 74  SpO2: 92%  Weight: 184 lb (83.5 kg)  Height: 5\' 3"  (1.6 m)    Estimated body mass index is 32.59 kg/m as calculated from the following:   Height as of this encounter: 5\' 3"  (1.6 m).   Weight as of this encounter: 184 lb (83.5 kg).  @WEIGHTCHANGE @ Physical Exam  General Appearance:    Alert, cooperative, no distress, appears stated age - yes , Deconditioned looking - yes , OBESE  - yes, Sitting on Wheelchair -  no  Head:    Normocephalic, without obvious abnormality, atraumatic  Eyes:    PERRL, conjunctiva/corneas clear,  Ears:    Normal TM's and external ear canals, both ears  Nose:   Nares normal, septum midline, mucosa normal, no drainage    or sinus tenderness. OXYGEN ON  - no . Patient is @ ra   Throat:   Lips, mucosa, and tongue normal; teeth and gums normal. Cyanosis on lips - no  Neck:   Supple, symmetrical, trachea midline, no adenopathy;    thyroid:  no enlargement/tenderness/nodules; no carotid   bruit or JVD  Back:      Symmetric, no curvature, ROM normal, no CVA tenderness  Lungs:     Distress - no , Wheeze no, Barrell Chest - no, Purse lip breathing - no, Crackles - scattered   Chest Wall:    No tenderness or deformity.    Heart:    Regular rate and rhythm, S1 and S2 normal, no rub   or gallop, Murmur - no  Breast Exam:    NOT DONE  Abdomen:     Soft, non-tender, bowel sounds active all four quadrants,    no masses, no organomegaly. Visceral obesity - ues  Genitalia:   NOT DONE  Rectal:   NOT DONE  Extremities:   Extremities - normal, Has Cane - no, Clubbing - YES, Edema - no  Pulses:   2+ and symmetric all extremities  Skin:   Stigmata of Connective Tissue Disease - no  Lymph nodes:   Cervical, supraclavicular, and axillary nodes normal  Psychiatric:  Neurologic:   Pleasant - yes, Anxious - no, Flat affect - no  CAm-ICU - neg, Alert and Oriented x 3 - yes, Moves all 4s - yes, Speech - normal, Cognition - intact           Assessment:       ICD-10-CM   1. ILD (interstitial lung disease) ([place]) J84.9   2. IPF (idiopathic pulmonary fibrosis) ([place]) J84.112   3. Pulmonary emphysema, unspecified emphysema  type ([place]) J43.9   4. Smoking history Z87.891   5. History of pulmonary embolus (PE) Z86.711    Diagnosis being given his IPF.  Is based on probable UIP.  Her age less than 67.  Slightly outside the market for IPF.  However she just had a recent blood clot and she is requiring 4 L with exertion therefore is very risky to do a biopsy.  There is enough clinical evidence pretest probability to call his IPF based on age, clubbing and associated emphysema.  We discussed anti-fibrotic's which are indicated.  Explained the rationale for this.  We discussed pirfenidone as first-line in the presence of anticoagulants.  However she says she sunburns easily and she will not wear sunscreen.  Therefore she is willing to accept some marginal bleeding risk despite being on Eliquis and take nintedanib.  She is aware  of the diarrhea risk and need to monitor.  We explained the co-pay program and charity program to make the drug affordable.    Plan:     Patient Instructions  ILD (interstitial lung disease) ([place]) IPF (idiopathic pulmonary fibrosis) ([place])  -The diagnosis I am giving you is idiopathic pulmonary fibrosis.  This is based on the pattern of CT called "probable UIP", smoking history, age 32, presence of clubbing and associated emphysema.  -  As discussed this is a progerssive disease  - In an ideal world you need lung biopsy but given fact you recently had blood clot and needing 4L  With exertion will just make a clinical diagnosis   - start ofev 150mg  twice daily with food   -Were not doing pirfenidone or Esbriet because of sunburn risk that you absolutely do not want to entertain.  We are doing nintedanib accepting marginal elevation and risk in the presence of Eliquis and will monitor your liver function and other GI side effects  - use o2 for exertona and night  -Other pillars of IPF management in the future such as rehab, research trials and transplant  Pulmonary emphysema, unspecified emphysema type ([place]) -Stable disease - continue anoro  Smoking history - glad you are working on quitting smokoing  - use e cig for now but ultimately quit  - quitting smoking is important for many reasons we discussed  History Pulmonary Embolus  - continue eliquis  Followup - 6 weeks with APP to ensure ofev tolerance  > 50% of this > 40 min visit spent in face to face counseling or/and coordination of care - by this undersigned MD - Dr Brand Males. This includes one or more of the following documented above: discussion of test results, diagnostic or treatment recommendations, prognosis, risks and benefits of management options, instructions, education, compliance or risk-factor reduction   SIGNATURE    Dr. Brand Males, M.D., F.C.C.P,  Pulmonary and Critical Care Medicine Staff  Physician, [place] Director - Interstitial Lung Disease  Program  Pulmonary [place] [facility] [place], [place], [address]  Pager: [phone], If no answer or between  15:00h - 7:00h: call [phone] Telephone: [phone]  4:39 PM 11/12/2018

## 2018-11-25 ENCOUNTER — Ambulatory Visit: Payer: 59 | Admitting: Pulmonary Disease

## 2018-11-27 ENCOUNTER — Telehealth: Payer: Self-pay | Admitting: Internal Medicine

## 2018-11-27 NOTE — Telephone Encounter (Signed)
OFEV enrollment form was faxed to [facility] yesterday, 11/26/2018 after it was signed and dated by MR.  Called [facility] and spoke with Kenney Houseman stating to her that the form was faxed yesterday, 11/26/2018 with the MD signature and the date it was signed. Per Kenney Houseman, they were just following up on a previous form that had been faxed to them 11/18/2018 that had not been signed and I stated to her that we had faxed one to [facility] yesterday for pt. Dawn Nichols expressed understanding. Nothing further needed.

## 2018-11-29 ENCOUNTER — Telehealth: Payer: Self-pay | Admitting: Gastroenterology

## 2018-11-29 NOTE — Telephone Encounter (Signed)
PT would like to cancel her appointment that she has @[facility]  on 3-26

## 2018-11-29 NOTE — Telephone Encounter (Signed)
Spoke to patient. She stated she is cancelling her [facility] long colonoscopy for 3/26 due to covid-19. Patient would like to reschedule for sometime in June. She will call the office in May.

## 2018-12-03 ENCOUNTER — Encounter: Payer: Self-pay | Admitting: *Deleted

## 2018-12-03 ENCOUNTER — Telehealth: Payer: Self-pay | Admitting: Internal Medicine

## 2018-12-03 NOTE — Telephone Encounter (Signed)
Attempted to complete PA on Cover My Meds. Key:  [id number]. The clinical questions were not populated due to a clinical override was not needed.  Attempted to contact [facility]. LMTCB x1 for [place].

## 2018-12-03 NOTE — Telephone Encounter (Signed)
Hope is returning call regarding Ofev 150 mg - from [facility] - trying to get status CB# [phone] option 2 and then option 1 FAX : [phone]

## 2018-12-03 NOTE — Telephone Encounter (Signed)
Spoke with Christy Sartorius at [facility]. He stated that [facility] was calling for a RX for the Ofev, they did not receive one. Gave a verbal order for Ofev 150mg .   As far as the PA, he could not tell me if one was needed at this time due to them not having the RX.   Will close this encounter.

## 2018-12-04 ENCOUNTER — Telehealth: Payer: Self-pay | Admitting: Internal Medicine

## 2018-12-04 NOTE — Telephone Encounter (Signed)
Returned phone call to patient, she states she wanted to check the status of her Ofev. Made aware we gave verbal orders for ofev yesterday. She states her appt in April was to check her tolerance of Ofev. Made patient aware we would keep her appt on the schedule for now but she needs to call us once she has received the medication because more than likely that appt will need to be pushed out. Voiced understanding. Nothing further is needed at this time.

## 2018-12-05 ENCOUNTER — Encounter (HOSPITAL_COMMUNITY): Admission: RE | Payer: Self-pay | Source: Home / Self Care

## 2018-12-05 ENCOUNTER — Ambulatory Visit (HOSPITAL_COMMUNITY): Admission: RE | Admit: 2018-12-05 | Payer: 59 | Source: Home / Self Care | Admitting: Gastroenterology

## 2018-12-05 SURGERY — COLONOSCOPY WITH PROPOFOL
Anesthesia: Monitor Anesthesia Care

## 2018-12-06 ENCOUNTER — Telehealth: Payer: Self-pay | Admitting: Internal Medicine

## 2018-12-06 NOTE — Telephone Encounter (Signed)
Called and spoke with pt who stated she received the [place] today and wanted to know if she should start taking the Center today or if she should wait until tomorrow. I stated to pt to begin tomorrow so she could be able to take one first thing in the morning with breakfast and then take the other one 12 hours later with supper. Pt expressed understanding. I did reschedule pt's televisit with Beth until 5/11 so it could be exactly 6 weeks from the start of OFEV. Nothing further needed.

## 2018-12-13 ENCOUNTER — Telehealth: Payer: Self-pay | Admitting: Family Medicine

## 2018-12-13 NOTE — Telephone Encounter (Signed)
Pt scheduled for Televisit with Dr Darnell Level 12/16/18 at 2pm.

## 2018-12-13 NOTE — Telephone Encounter (Signed)
PT is watning to know if Dr Darnell Level can up the dosage of the FLUoxetine (PROZAC) 20 MG tablet without her coming into the office to be seen

## 2018-12-16 ENCOUNTER — Other Ambulatory Visit: Payer: Self-pay

## 2018-12-16 ENCOUNTER — Ambulatory Visit (INDEPENDENT_AMBULATORY_CARE_PROVIDER_SITE_OTHER): Payer: PRIVATE HEALTH INSURANCE | Admitting: Family Medicine

## 2018-12-16 DIAGNOSIS — F329 Major depressive disorder, single episode, unspecified: Secondary | ICD-10-CM

## 2018-12-16 DIAGNOSIS — F4329 Adjustment disorder with other symptoms: Secondary | ICD-10-CM

## 2018-12-16 MED ORDER — APIXABAN 5 MG PO TABS: 5.0000 mg | ORAL_TABLET | Freq: Two times a day (BID) | ORAL | 3 refills | Status: DC

## 2018-12-16 MED ORDER — FLUOXETINE HCL 40 MG PO CAPS: 40.0000 mg | ORAL_CAPSULE | Freq: Every day | ORAL | 1 refills | Status: AC

## 2018-12-16 NOTE — Progress Notes (Signed)
Telephone visit  Subjective: CC: anxiety disorder follow up PCP: [name], DO HPI:Dawn Nichols is a 63 y.o. female calls for telephone consult today. Patient provides verbal consent for consult held via phone.  Location of patient: home Location of provider: [facility] Others present for call: none  1. Anxiety Patient was last seen in January for depression/stress.  She was started on Prozac 20 mg daily 6 weeks prior to improvement in her symptoms.  Most of her depressive symptoms seem to surround various medical issues including recent NSTEMI, PE and possible diagnosis of cancer, as there were some abnormalities appreciated on imaging that were being worked up.  She follows up today and notes that anxiety has been increased, particularly since her husband was furloughed last week.  She reports increased tension and stress with regards to her medications.  She is on his insurance.  He is trying to continue his insurance independently while being laid off.  She is currently being treated with OFEV for her lung disease which she notes is very expensive out-of-pocket.  At this time, she is not having to pay anything for but if the insurance becomes unavailable she is not sure that she will be able to continue the medication.  She did not follow-up on colonoscopy because of the COVID-19 pandemic.  She denies any GI issues at this time.  She reports compliance with Eliquis, which was recommended to continue until her abdomen/colon could be evaluated to rule out any malignant processes.   ROS: Per HPI  No Known Allergies Past Medical History:  Diagnosis Date  . Arthritis   . CHF (congestive heart failure) ([place])   . Emphysema of lung ([place])   . Hyperlipidemia   . NSTEMI (non-ST elevated myocardial infarction) ([place]) 06/25/2018  . Tobacco use     Current Outpatient Medications:  .  albuterol (PROVENTIL HFA;VENTOLIN HFA) 108 (90 Base) MCG/ACT inhaler, Inhale 2 puffs into the lungs every 6 (six)  hours as needed for wheezing or shortness of breath., Disp: 1 Inhaler, Rfl: 2 .  ANORO ELLIPTA 62.5-25 MCG/INH AEPB, TAKE 1 PUFF BY MOUTH EVERY DAY, Disp: 60 each, Rfl: 3 .  apixaban (ELIQUIS) 5 MG TABS tablet, Take 1 tablet (5 mg total) by mouth 2 (two) times daily. 2 tab po bid x 7 days then 1 tab po bid x 3 months, Disp: 60 tablet, Rfl: 3 .  cephALEXin (KEFLEX) 500 MG capsule, Take 1 capsule (500 mg total) by mouth 2 (two) times daily., Disp: 14 capsule, Rfl: 0 .  FLUoxetine (PROZAC) 20 MG tablet, Take 1 tablet (20 mg total) by mouth daily., Disp: 90 tablet, Rfl: 1 .  gabapentin (NEURONTIN) 600 MG tablet, Take 600 mg by mouth 4 (four) times daily. , Disp: , Rfl:  .  Nintedanib (OFEV) 150 MG CAPS, Take 1 capsule (150 mg total) by mouth 2 (two) times daily., Disp: 60 capsule, Rfl: 11 .  nystatin (MYCOSTATIN/NYSTOP) powder, Apply topically 4 (four) times daily., Disp: 15 g, Rfl: 0 .  nystatin-triamcinolone ointment (MYCOLOG), Apply 1 application topically 2 (two) times daily., Disp: 30 g, Rfl: 0 .  oxyCODONE-acetaminophen (PERCOCET) 10-325 MG per tablet, Take 1 tablet by mouth every 6 (six) hours as needed for pain. , Disp: , Rfl:  .  polyethylene glycol-electrolytes (NULYTELY/GOLYTELY) 420 g solution, Take 4,000 mLs by mouth as directed., Disp: 4000 mL, Rfl: 0  Depression screen [facility] 2/9 12/16/2018 11/05/2018 10/11/2018  Decreased Interest 0 0 0  Down, Depressed, Hopeless 0 0  0  PHQ - 2 Score 0 0 0  Altered sleeping - - 1  Tired, decreased energy - - 1  Change in appetite - - 0  Feeling bad or failure about yourself  - - 0  Trouble concentrating - - 0  Moving slowly or fidgety/restless - - 0  Suicidal thoughts - - 0  PHQ-9 Score - - 2  Difficult doing work/chores - - Not difficult at all   GAD 7 : Generalized Anxiety Score 12/16/2018 10/11/2018  Nervous, Anxious, on Edge 1 0  Control/stop worrying 2 0  Worry too much - different things 1 0  Trouble relaxing 1 0  Restless 0 1  Easily annoyed  or irritable 0 0  Afraid - awful might happen 0 0  Total GAD 7 Score 5 1  Anxiety Difficulty Not difficult at all Not difficult at all    Assessment/ Plan: 63 y.o. female   1. Reactive depression Her gad 7 score has increased as her last visit in January.  I do think that this is situational but I have agreed to proceed with increased dose of Prozac 40 mg daily as I do not see that there will be a change in her situation anytime soon.  We discussed the possibility of GI side effects.  I advised her to follow-up in the next 6 weeks with me for recheck.  If she is having persistent anxiety symptoms, we could consider adding buspirone. - FLUoxetine (PROZAC) 40 MG capsule; Take 1 capsule (40 mg total) by mouth daily.  Dispense: 90 capsule; Refill: 1  2. Stress and adjustment reaction As above - FLUoxetine (PROZAC) 40 MG capsule; Take 1 capsule (40 mg total) by mouth daily.  Dispense: 90 capsule; Refill: 1   Start time: 1:59pm End time: 2:12pm  Total time spent on patient care (including telephone call/ virtual visit): 15 minutes  [place], [place] [phone]

## 2018-12-16 NOTE — Patient Instructions (Signed)

## 2018-12-24 ENCOUNTER — Ambulatory Visit: Payer: 59 | Admitting: Primary Care

## 2019-01-14 ENCOUNTER — Telehealth: Payer: Self-pay | Admitting: Internal Medicine

## 2019-01-14 DIAGNOSIS — I513 Intracardiac thrombosis, not elsewhere classified: Secondary | ICD-10-CM

## 2019-01-14 DIAGNOSIS — I519 Heart disease, unspecified: Secondary | ICD-10-CM

## 2019-01-14 DIAGNOSIS — I240 Acute coronary thrombosis not resulting in myocardial infarction: Secondary | ICD-10-CM

## 2019-01-14 DIAGNOSIS — I252 Old myocardial infarction: Secondary | ICD-10-CM

## 2019-01-14 NOTE — Telephone Encounter (Signed)
Called patient- she wants to cancel ECHO as she does not feel safe enough to come and have it done yet. She is questioning if she should keep her appointment with MD since it was to follow up the echo. I advised patient I would route message to MD to make aware, and have recommendations.

## 2019-01-14 NOTE — Telephone Encounter (Signed)
New Message:   Pt cx her Echo for 01-22-19. She wants to know should she still her appt with Dr Margaretann Loveless on 02-04-19?

## 2019-01-15 NOTE — Addendum Note (Signed)
Addended by: Raiford Simmonds on: 01/15/2019 10:01 AM   Modules accepted: Orders

## 2019-01-15 NOTE — Telephone Encounter (Signed)
Spoke to patient  - she wold prefer to have echo done  Early part of Aug 2020 - will send message to echo scheduler to schedule.   will need to be schedule prior to  8/13 /20- appointe schedule w/ dr Margaretann Loveless to review

## 2019-01-15 NOTE — Telephone Encounter (Signed)
We can postpone echo until June or July and reschedule follow up afterward at patient's convenience. Please let her know. Thank you!

## 2019-01-20 ENCOUNTER — Other Ambulatory Visit: Payer: Self-pay

## 2019-01-20 ENCOUNTER — Ambulatory Visit (INDEPENDENT_AMBULATORY_CARE_PROVIDER_SITE_OTHER): Payer: 59 | Admitting: Primary Care

## 2019-01-20 ENCOUNTER — Encounter: Payer: Self-pay | Admitting: Primary Care

## 2019-01-20 DIAGNOSIS — J849 Interstitial pulmonary disease, unspecified: Secondary | ICD-10-CM

## 2019-01-20 NOTE — Progress Notes (Signed)
Virtual Visit via Telephone Note  I connected with Dawn Nichols on 01/20/19 at  1:30 PM EDT by telephone and verified that I am speaking with the correct person using two identifiers.  Location: Patient: Home Provider: Office   I discussed the limitations, risks, security and privacy concerns of performing an evaluation and management service by telephone and the availability of in person appointments. I also discussed with the patient that there may be a patient responsible charge related to this service. The patient expressed understanding and agreed to proceed.  History of Present Illness: 63 year old female, former tobacco smoker (current e-cigarette use). PMH significant for emphysema, chronic respiratory failure. Patient of Dr. Valeta Harms, seen by [name] on 11/12/18 for ILD eval. Diagnosed with idiopathic pulmonary fibrosis. Started on OFEV 150mg  twice daily. Monitor LFT's and GI side effects. Use O2 on exertion and at night.   01/20/2019 Patient called today for 6 week follow-up. She is doing alright, thinks she feels a little better some days. Started taking OFEV on March 28th. Reports compliance with medication. Mild side effects. Reports some nausea when she does not eat and occasional dizziness. Uses electronic cigarette 2-3 times a day. Only wearing oxygen during the day. DME company [employer] ([employer]). Still able to do most of her ADLs. Husband does the shopping d/t COVID risks.   Observations/Objective:  - No shortness of breath, wheezing or cough noted   Assessment and Plan:  IPF - Continue OFEV 150mg  twice daily - Minimal/tolerable side effects from medication - Needs LFTS this week - Continue oxygen on exertion AND at night  - Follow up in 6-8 weeks with [name]  Emphysema - Continue Anoro 1 puff daily - Albuterol rescue inhaler 2 puffs every 4-6 hours as needed for SOB  Hx PE: - Continue Eliquis 5mg  twice daily   Tobacco use - Continues to work on  quitting, using E-cigarette 2-3 times a day - Further smoking cessation encouraged    Follow Up Instructions:   6-8 weeks with [name] in ILD clinic  I discussed the assessment and treatment plan with the patient. The patient was provided an opportunity to ask questions and all were answered. The patient agreed with the plan and demonstrated an understanding of the instructions.   The patient was advised to call back or seek an in-person evaluation if the symptoms worsen or if the condition fails to improve as anticipated.  I provided 22 minutes of non-face-to-face time during this encounter.   Martyn Ehrich, NP

## 2019-01-20 NOTE — Patient Instructions (Addendum)
Continue OFEV 150mg  twice daily Continue Anoro 1 puff daily; albuterol rescue inhaler 2 puffs every 4-6 hours as needed for shortness of breath  Needs labs this week (office open 9-5pm)  Follow up with [name] in 6-8 WEEKS or sooner if symptoms acutely worsen    Pulmonary Fibrosis  Pulmonary fibrosis is a type of lung disease that causes scarring. Over time, the scar tissue builds up in the air sacs of your lungs (alveoli). This makes it hard for you to breathe. Less oxygen can get into your blood. Scarring from pulmonary fibrosis gets worse over time. This damage is permanent and may lead to other serious health problems. What are the causes? There are many different causes of pulmonary fibrosis. Sometimes the cause is not known. This is called idiopathic pulmonary fibrosis. Other causes include:  Exposure to chemicals and substances found in agricultural, farm, Architect, or factory work. These include mold, asbestos, silica, metal dusts, and toxic fumes.  Sarcoidosis. In this disease, areas of inflammatory cells (granulomas) form and most often affect the lungs.  Autoimmune diseases. These include diseases such as rheumatoid arthritis, systemic sclerosis, or connective tissue disease.  Taking certain medicines. These include drugs used in radiation therapy or used to treat seizures, heart problems, and some infections. What increases the risk? You are more likely to develop this condition if:  You have a family history of the disease.  You are older. The condition is more common in older adults.  You have a history of smoking.  You have a job that exposes you to certain chemicals.  You have gastroesophageal reflux disease (GERD). What are the signs or symptoms? Symptoms of this condition include:  Difficulty breathing that gets worse with activity.  Shortness of breath (dyspnea).  Dry, hacking cough.  Rapid, shallow breathing during exercise or while at  rest.  Bluish skin and lips.  Loss of appetite.  Weakness.  Weight loss and fatigue.  Rounded and enlarged fingertips (clubbing). How is this diagnosed? This condition may be diagnosed based on:  Your symptoms and medical history.  A physical exam. You may also have tests, including:  A test that involves looking inside your lungs with an instrument (bronchoscopy).  Imaging studies of your lungs and heart.  Tests to measure how well you are breathing (pulmonary function tests).  Blood tests.  Tests to see how well your lungs work while you are walking (pulmonary stress test).  A procedure to remove a lung tissue sample to look at it under a microscope (biopsy). How is this treated? There is no cure for pulmonary fibrosis. Treatment focuses on managing symptoms and preventing scarring from getting worse. This may include:  Medicines, such as: ? Steroids to prevent permanent lung changes. ? Medicines to suppress your body's defense system (immune system). ? Medicines to help with lung function by reducing inflammation or scarring.  Ongoing monitoring with X-rays and lab work.  Oxygen therapy.  Pulmonary rehabilitation.  Surgery. In some cases, a lung transplant is possible. Follow these instructions at home:     Medicines  Take over-the-counter and prescription medicines only as told by your health care provider.  Keep your vaccinations up to date as recommended by your health care provider. General instructions  Do not use any products that contain nicotine or tobacco, such as cigarettes and e-cigarettes. If you need help quitting, ask your health care provider.  Get regular exercise, but do not overexert yourself. Ask your health care provider to suggest some  activities that are safe for you to do. ? If you have physical limitations, you may get exercise by walking, using a stationary bike, or doing chair exercises. ? Ask your health care provider about  using oxygen while exercising.  If you are exposed to chemicals and substances at work, make sure that you wear a mask or respirator at all times.  Join a pulmonary rehabilitation program or a support group for people with pulmonary fibrosis.  Eat small meals often so you do not get too full. Overeating can make breathing trouble worse.  Maintain a healthy weight. Lose weight if you need to.  Do breathing exercises as directed by your health care provider.  Keep all follow-up visits as told by your health care provider. This is important. Contact a health care provider if you:  Have symptoms that do not get better with medicines.  Are not able to be as active as usual.  Have trouble taking a deep breath.  Have a fever or chills.  Have blue lips or skin.  Have clubbing of your fingers. Get help right away if you:  Have a sudden worsening of your symptoms.  Have chest pain.  Cough up mucus that is dark in color.  Have a lot of headaches.  Get very confused or sleepy. Summary  Pulmonary fibrosis is a type of lung disease that causes scar tissue to build up in the air sacs of your lungs (alveoli) over time. Less oxygen can get into your blood. This makes it hard for you to breathe.  Scarring from pulmonary fibrosis gets worse over time. This damage is permanent and may lead to other serious health problems.  You are more likely to develop this condition if you have a family history of the condition or a job that exposes you to certain chemicals.  There is no cure for pulmonary fibrosis. Treatment focuses on managing symptoms and preventing scarring from getting worse. This information is not intended to replace advice given to you by your health care provider. Make sure you discuss any questions you have with your health care provider. Document Released: 11/18/2003 Document Revised: 10/03/2017 Document Reviewed: 10/03/2017 Elsevier Interactive Patient Education  2019  [employer].

## 2019-01-22 ENCOUNTER — Other Ambulatory Visit (HOSPITAL_COMMUNITY): Payer: 59

## 2019-01-22 NOTE — Progress Notes (Signed)
PCCM: Agree. Follow up in ILD clinic. Thanks for calling her.  [facility] 01/22/2019 10:24 AM

## 2019-01-24 ENCOUNTER — Other Ambulatory Visit (INDEPENDENT_AMBULATORY_CARE_PROVIDER_SITE_OTHER): Payer: 59

## 2019-01-24 ENCOUNTER — Other Ambulatory Visit: Payer: Self-pay | Admitting: Family Medicine

## 2019-01-24 ENCOUNTER — Telehealth: Payer: Self-pay | Admitting: Internal Medicine

## 2019-01-24 DIAGNOSIS — J849 Interstitial pulmonary disease, unspecified: Secondary | ICD-10-CM | POA: Diagnosis not present

## 2019-01-24 LAB — HEPATIC FUNCTION PANEL
ALT: 19 U/L (ref 0–35)
AST: 25 U/L (ref 0–37)
Albumin: 4.1 g/dL (ref 3.5–5.2)
Alkaline Phosphatase: 40 U/L (ref 39–117)
Bilirubin, Direct: 0.1 mg/dL (ref 0.0–0.3)
Total Bilirubin: 0.4 mg/dL (ref 0.2–1.2)
Total Protein: 7.6 g/dL (ref 6.0–8.3)

## 2019-01-24 NOTE — Telephone Encounter (Signed)
Attempted to call back. Left message on voicemail (per dpr) that labs were normal and no changes per B. Volanda Napoleon, NP review:  Notes recorded by Martyn Ehrich, NP on 01/24/2019 at 3:02 PM EDT Labs were fine, no change in medications  Patient instructed to contact us if further questions or concerns.  Will close encounter.

## 2019-01-24 NOTE — Progress Notes (Signed)
Labs were fine, no change in medications

## 2019-01-31 ENCOUNTER — Telehealth: Payer: Self-pay

## 2019-01-31 MED ORDER — APIXABAN 5 MG PO TABS: 5.0000 mg | ORAL_TABLET | Freq: Two times a day (BID) | ORAL | 3 refills | Status: AC

## 2019-02-04 ENCOUNTER — Ambulatory Visit: Payer: 59 | Admitting: Internal Medicine

## 2019-02-28 ENCOUNTER — Telehealth: Payer: Self-pay | Admitting: Internal Medicine

## 2019-02-28 DIAGNOSIS — J849 Interstitial pulmonary disease, unspecified: Secondary | ICD-10-CM

## 2019-02-28 NOTE — Telephone Encounter (Signed)
Spoke with pt, she is requesting an order for a portable concentrator from [employer]. Can we send the order? Please advise.    Pt also asked about traveling with oxygen. I sent Melissa a message to see if she could help pt figure out how to refill her tanks while she is at [facility]. I will await her message back.

## 2019-03-03 ENCOUNTER — Encounter: Payer: Self-pay | Admitting: Primary Care

## 2019-03-03 ENCOUNTER — Telehealth (INDEPENDENT_AMBULATORY_CARE_PROVIDER_SITE_OTHER): Payer: 59 | Admitting: Primary Care

## 2019-03-03 ENCOUNTER — Other Ambulatory Visit: Payer: Self-pay

## 2019-03-03 DIAGNOSIS — J849 Interstitial pulmonary disease, unspecified: Secondary | ICD-10-CM

## 2019-03-03 NOTE — Patient Instructions (Addendum)
Follow up ILD clinic with pulmonary function test in Aug/Sept   Needs HRCT and hepatic function panel in August   We will look into travel O2 from [employer] for you to go to [facility] in 2 weeks  Please continue to practice social distancing and wear a mask when out in [place] ([id number]) Are you at risk?  Are you at risk for the Coronavirus (COVID-19)?  To be considered HIGH RISK for Coronavirus (COVID-19), you have to meet the following criteria:  . Traveled to [place], [place], [place], [place] or [place]; or in the [place] to [place], [place], [place], or [place]; and have fever, cough, and shortness of breath within the last 2 weeks of travel OR . Been in close contact with a person diagnosed with COVID-19 within the last 2 weeks and have fever, cough, and shortness of breath . IF YOU DO NOT MEET THESE CRITERIA, YOU ARE CONSIDERED LOW RISK FOR COVID-19.  What to do if you are HIGH RISK for COVID-19?  [name] If you are having a medical emergency, call 911. . Seek medical care right away. Before you go to a doctor's office, urgent care or emergency department, call ahead and tell them about your recent travel, contact with someone diagnosed with COVID-19, and your symptoms. You should receive instructions from your physician's office regarding next steps of care.  . When you arrive at healthcare provider, tell the healthcare staff immediately you have returned from visiting [place], [place], [place], [place] or [place]; or traveled in the [place] to [place], [place], [place], or [place]; in the last two weeks or you have been in close contact with a person diagnosed with COVID-19 in the last 2 weeks.   . Tell the health care staff about your symptoms: fever, cough and shortness of breath. . After you have been seen by a medical provider, you will be either: o Tested for (COVID-19) and discharged home on quarantine except to seek medical care if  symptoms worsen, and asked to  - Stay home and avoid contact with others until you get your results (4-5 days)  - Avoid travel on public transportation if possible (such as bus, train, or airplane) or o Sent to the Emergency Department by EMS for evaluation, COVID-19 testing, and possible admission depending on your condition and test results.  What to do if you are LOW RISK for COVID-19?  Reduce your risk of any infection by using the same precautions used for avoiding the common cold or flu:  [name] Wash your hands often with soap and warm water for at least 20 seconds.  If soap and water are not readily available, use an alcohol-based hand sanitizer with at least 60% alcohol.  . If coughing or sneezing, cover your mouth and nose by coughing or sneezing into the elbow areas of your shirt or coat, into a tissue or into your sleeve (not your hands). . Avoid shaking hands with others and consider head nods or verbal greetings only. . Avoid touching your eyes, nose, or mouth with unwashed hands.  . Avoid close contact with people who are sick. . Avoid places or events with large numbers of people in one location, like concerts or sporting events. . Carefully consider travel plans you have or are making. . If you are planning any travel outside or inside the [place], visit the CDC's Travelers' Health webpage for the latest health notices. . If you have some  symptoms but not all symptoms, continue to monitor at home and seek medical attention if your symptoms worsen. . If you are having a medical emergency, call 911.   [facility] / e-Visit: [email or web address]         [facility]: [facility]: [phone]                   [facility]: [phone]

## 2019-03-03 NOTE — Telephone Encounter (Signed)
Yes

## 2019-03-03 NOTE — Telephone Encounter (Signed)
Called spoke with patient and informed her that an order for POC has been sent to [employer] and patient also given the websites that may be able to help her with refilling her tanks while on vacation.  Patient stated she has already tried [email or web address].com and was told they cannot help her.  adsvised patient to try the other website oxygentogo.com and if she cannot get any help there, to call [employer] and ask for additional help.  Pt voiced her understanding.  Nothing further needed at this time; will sign off.

## 2019-03-03 NOTE — Telephone Encounter (Signed)
Darlina Guys sent to Jannette Spanner, [place]!   Our partner, Deidre Ala, for patients that were traveling is no longer in business. During the last couple months our patients have not been traveling due to [place] but as our states start to open back up and summer time rolls around, I am sure we are going to start getting requests. Below are a couple of companies that other regions have been using and directing their patients towards. These are 3rd party companies that will work directly with the patients.   [email or web address]   [email or web address]   Hope this helps!  Melissa   I called pt to let her know that I sent an order for POC to [facility] and to advise her of message above but she did not answer. LM for her to call back.

## 2019-03-03 NOTE — Telephone Encounter (Signed)
Pt returning call from nurse a/b o2 she can be reached @ [phone].Dawn Nichols

## 2019-03-03 NOTE — Progress Notes (Signed)
Virtual Visit via Telephone Note  I connected with Dawn Nichols on 03/03/19 at  1:30 PM EDT by telephone and verified that I am speaking with the correct person using two identifiers.  Location: Patient: Home Provider: Office   I discussed the limitations, risks, security and privacy concerns of performing an evaluation and management service by telephone and the availability of in person appointments. I also discussed with the patient that there may be a patient responsible charge related to this service. The patient expressed understanding and agreed to proceed.  History of Present Illness: 63 year old female, former tobacco smoker (current e-cigarette use). PMH significant for emphysema, chronic respiratory failure. Patient of Dr. Valeta Harms, seen by [name] on 11/12/18 for ILD eval. Diagnosed with idiopathic pulmonary fibrosis. Started on OFEV 142m twice daily. Monitor LFT's and GI side effects. Use O2 on exertion and at night. Maintained on Anoro.   01/20/2019 Patient called today for 6 week follow-up. She is doing alright, thinks she feels a little better some days. Started taking OFEV on March 28th. Reports compliance with medication. Mild side effects. Reports some nausea when she does not eat and occasional dizziness. Uses electronic cigarette 2-3 times a day. Only wearing oxygen during the day. DME company [employer] ([employer]). Still able to do most of her ADLs. Husband does the shopping d/t COVID risks.   03/03/2019 Patient presents today for 4-6 week follow-up. Breathing is baseline, no change. Continues Ofev 1517mtwice daily. Experiences occasional diarrhea, states that eating Bannana's help. She was sent antidiarrheals with OFEV starter kit but has not needed to use them. Continues anoro 1 puff daily. [place]      Observations/Objective:  - No significant shortness of breath, wheezing or cough observed during phone  conversation  Assessment and Plan:  ILD: - Breathing baseline - Continues ofev 15079mwice daily with minimal SE's since March 2020 - Needs repeat HRCT and PFTs in 3 months - LFTs normal in May, needs repeat LFTs in August   Pulmonary emphysema: - Continue Anoro 1 puff daily; prn albuterol hfa q 6 hours  Chronic respiratory failure with hypxia - Continue O2 on exertion and at night - DME referral to provide patient with travel oxygen   Follow Up Instructions:   - FU in 3 months in ILD clinic  I discussed the assessment and treatment plan with the patient. The patient was provided an opportunity to ask questions and all were answered. The patient agreed with the plan and demonstrated an understanding of the instructions.   The patient was advised to call back or seek an in-person evaluation if the symptoms worsen or if the condition fails to improve as anticipated.  I provided 18 minutes of non-face-to-face time during this encounter.   EliMartyn EhrichP

## 2019-03-06 NOTE — Progress Notes (Signed)
PCCM: Thanks for calling her.  [facility] 03/06/2019 5:00 PM

## 2019-03-20 ENCOUNTER — Telehealth: Payer: Self-pay | Admitting: Internal Medicine

## 2019-03-25 NOTE — Telephone Encounter (Signed)
Called and scheduled patient for PFT on 05/13/2019-pr

## 2019-04-01 ENCOUNTER — Telehealth: Payer: Self-pay | Admitting: Internal Medicine

## 2019-04-01 NOTE — Telephone Encounter (Signed)
Called and spoke with pt stating that cards is who ordered the echo to be performed so she needed to contact their office to see if covid test was needing to be done prior to echo next Friday, 7/31. Pt verbalized understanding and stated she would contact their office. Nothing further needed.

## 2019-04-07 ENCOUNTER — Telehealth: Payer: Self-pay | Admitting: Internal Medicine

## 2019-04-07 NOTE — Telephone Encounter (Signed)
Called and spoke with pt to see it she was wearing O2 and pt states that she does wear O2 with exertion but has been having to wear it more often. Pt states that her O2 level has dropped as low as the 70s.  Pt was wearing 4L but has had to bump it up to 6L. Pt said when she is sitting at rest, she is able to take the O2 off but anytime she does any ambulation, she will have to put the O2 back on.  Stated to pt that we needed to get her into office to reassess O2 especially if it is dropping so low and pt verbalized understanding. MR has opened schedule Tues. 7/28 so pt has been scheduled for an appt with MR at 4:30. Nothing further needed.

## 2019-04-08 ENCOUNTER — Other Ambulatory Visit: Payer: Self-pay

## 2019-04-08 ENCOUNTER — Encounter: Payer: Self-pay | Admitting: Internal Medicine

## 2019-04-08 ENCOUNTER — Ambulatory Visit (INDEPENDENT_AMBULATORY_CARE_PROVIDER_SITE_OTHER): Payer: 59 | Admitting: Internal Medicine

## 2019-04-08 VITALS — BP 118/68 | HR 76 | Temp 97.9°F | Ht 65.0 in | Wt 185.0 lb

## 2019-04-08 DIAGNOSIS — J84112 Idiopathic pulmonary fibrosis: Secondary | ICD-10-CM

## 2019-04-08 DIAGNOSIS — J849 Interstitial pulmonary disease, unspecified: Secondary | ICD-10-CM

## 2019-04-08 DIAGNOSIS — I2699 Other pulmonary embolism without acute cor pulmonale: Secondary | ICD-10-CM | POA: Diagnosis not present

## 2019-04-08 DIAGNOSIS — Z5181 Encounter for therapeutic drug level monitoring: Secondary | ICD-10-CM | POA: Diagnosis not present

## 2019-04-08 NOTE — Patient Instructions (Addendum)
ILD (interstitial lung disease) ([place]) IPF (idiopathic pulmonary fibrosis) ([place])  - suspect disease is getting worse - my biggest concern is we are running out of options on this - continue oxygen 4-6L at all times 24/7 - continue ofev 150mg  twice daily  - check LFT blood work 04/08/2019 - move up PFT and CT chest from end of august to middle-august 2020 - follow all covid precautions - avoid people cluster > 6 people and all indoor clusters (churches included)   Pulmonary emphysema, unspecified emphysema type ([place]) -Stable disease - continue anoro  Smoking history - glad you have quit  History Pulmonary Embolus  - continue eliquis  Followup -2-3 weeks with APP to decide next course

## 2019-04-08 NOTE — Progress Notes (Signed)
OV 09/26/2018  Subjective:  Patient ID: Dawn Dawn Nichols, female , DOB: May 05, 1956 , age 63 y.o. , MRN: [id number] , ADDRESS: [place] [id number]   09/26/2018 -   Chief Complaint  Patient presents with  . Follow-up    Referral from Dawn Dawn Nichols for ILD eval.  Pt states she has been doing okay since last visit. Pt does become SOB with exertion and does wear O2 as needed with exertion. Denies any complaints of cough or CP.      HPI Dawn Dawn Nichols 63 y.o. -female referred by Dawn Dawn Nichols for evaluation of interstitial lung disease.  She is a former Engineer, manufacturing systems who in mid October 2019 ended up acutely respiratory ill with hypoxemia and diagnosed to have pulmonary embolism associated with emphysema and interstitial infiltrates.  She has been maintained on anticoagulation since that admission.  She also tells me that ever since then she is needed to be on oxygen 4 L with exertion now.  Prior to this acute onset of illness she was just having insidious 1 onset of shortness of breath over the last few to several months.  For example in 2018 she could walk the peer in a beach without a problem but by 2019 September she had to stop to get her breath.  During this time there is a mediastinal node seen this was PET scan positive but then the sizes shrunk so she is on observation.  She has also got intestinal lesion on the PET scan for which she is awaiting to see GI at this stage.  At this point in time overall she is stable but definitely worse than several months ago but improved compared to October 2019 admission.  Both the PET scan and CT angiogram chest in December 2019 and October 2019 respectively do show presence of ILD not otherwise specified therefore she is here.  I personally visualized those images to confirm this.  In October 2019 she had limited autoimmune profile this is negative.;  ANA, double-stranded DNA and rheumatoid factor only.   [facility] Comprehensive ILD  Questionnaire  Symptoms: Shortness of breath started suddenly approximately 3 months ago.  After the treatment of pulmonary embolism is gotten better but then plateaued.  No episodic shortness of breath.  Currently level 1 dyspnea for doing household work and taking a shower.  Level 3 dyspnea for walking up stairs and walking up a hill.  Oxygen improves it.  No cough no wheezing   Past Medical History : Positive for diagnosis of emphysema November 2019 and standard arthritis and back surgery from scoliosis in 2013.  Denies scleroderma or lupus or Sjogren's or acid reflux or sleep apnea or HIV or pulmonary hypertension or diabetes or thyroid or stroke.  No seizures.  In November/October 2019 suffered pulmonary embolism.  Has had back fusion surgery in 2013 with scoliosis.   ROS: Positive for fatigue arthralgia of back pain and rare acid reflux but otherwise no GI side effects   FAMILY HISTORY of LUNG DISEASE: No family history of lung disease   EXPOSURE HISTORY:.  Has been smoking cigarettes since 1976.  Initially she said she quit smoking but was smoking 1 pack a day.  Now 1 pack in 3 days.  No marijuana use.  No vaping no cocaine use no intravenous drug use.   HOME and HOBBY DETAILS : [place].  Age of the home is 11 years.  She  is lived there for 11 years.  There is no dampness in the house.  There is no mold or mildew in the house.  Does not use humidifier.  Does not use CPAP.  Does use oxygen with there is no mold in it.  No steam iron use.  No nebulizer use no Jacuzzi.  No fountain use.  No pet birds.  No pet gerbils.  No feather pillow feather duvet.  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  Has done machine operating work.  Has done painting and  spray painting   PULMONARY TOXICITY HISTORY (27 items): Denies         OV 11/12/2018  Subjective:  Patient ID: Dawn Dawn Nichols, female , DOB: Dec 15, 1955 , age 63 y.o. , MRN: [id number] , ADDRESS: [place] [id number]   11/12/2018 -   Chief Complaint  Patient presents with  . Follow-up    PFT performed today. HRCT performed 1/31.        HPI Dawn Dawn Nichols 63 y.o. -presents for ILD follow-up.  This visit is to review her ILD work-up.  She had spirometry and DLCO today and it is stable compared to November 2019.  Her oxygen desaturation test shows to stability while using 4 L of oxygen.  Subjectively she does feel stable.  We did an extended serology work-up and it is negative.  A high-resolution CT chest that I personally visualized and read by thoracic radiologist is described as probable UIP.   Her other issues 1/smoking she continues to smoke but his first Dawn Nichols to quit smoking completely.  She is aware of the need to quit smoking 2.  Emphysema: She continues on Anoro for this it is currently stable 3.  History of pulmonary embolus fall 2019: She continues Eliquis.  Results for ANONA, GIOVANNINI (MRN [id number]) as of 11/12/2018 16:04  Ref. Range 06/25/2018 15:21 09/26/2018 12:24  ASPERGILLUS FUMIGATUS Latest Ref Range: NEGATIVE   NEGATIVE  [place] Serum Latest Ref Range: NEGATIVE   NEGATIVE  ANA Ab, IFA Unknown Negative   Angiotensin-Converting Enzyme Latest Ref Range: 14 - 82 U/L 28   Cyclic Citrullin Peptide Ab Latest Units: UNITS  <16  ds DNA Ab Latest Ref Range: 0 - 9 IU/mL 2   Myeloperoxidase Abs Latest Units: AI  <1.0  Serine Protease 3 Latest Units: AI  <1.0  RA Latex Turbid. Latest Ref Range: 0.0 - 13.9 IU/mL <10.0   SSA (Ro) (ENA) Antibody, IgG Latest Ref Range: <1.0 NEG AI  <1.0 NEG  SSB (La) (ENA) Antibody, IgG Latest Ref Range: <1.0 NEG AI  <1.0 NEG  Scleroderma (Scl-70) (ENA) Antibody, IgG Latest Ref Range: <1.0 NEG AI  <1.0 NEG     IMPRESSION HRCT  1.  Dependent basilar predominant patchy confluent subpleural reticulation, ground-glass attenuation, mild traction bronchiectasis and apparent mild honeycombing. Some of the dependent opacities resolve on prone imaging, although prone assessment is incomplete. 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 Suggest follow-up high-resolution chest CT study in 6-12 months to assess temporal pattern stability, as clinically warranted. Findings are categorized as probable UIP per consensus guidelines: Diagnosis of Idiopathic Pulmonary Fibrosis: An Official ATS/ERS/JRS/ALAT Clinical Practice Guideline. [place], Iss 5, [phone], May 12 2017. 2. Mild mediastinal and right infrahilar adenopathy is decreased since 06/24/2018 chest CT, nonspecific, more likely reactive as suggested on 08/21/2018 PET-CT. 3. Two vessel coronary atherosclerosis. 4. Cystic 2.0 cm pancreatic body lesion, stable since 06/24/2018 CT. If the patient is able to adequately breath hold, MRI abdomen without and with IV contrast is indicated for further characterization. If the patient is unable to adequately breath hold, pancreas protocol CT abdomen without and with IV contrast is suggested for further characterization.  Aortic Atherosclerosis (ICD10-I70.0) and Emphysema (ICD10-J43.9).   Electronically Signed   By: Ilona Sorrel M.D.   On: 10/14/2018 09:24  OV 04/08/2019  Subjective:  Patient ID: Dawn Dawn Nichols, female , DOB: 12/19/1955 , age 89 y.o. , MRN: [id number] , ADDRESS: [place]  [id number]   04/08/2019 -   Chief Complaint  Patient presents with  . Oxygen level drops when on 4L    Does not feel like she is able to get in a full breath when using the Anoro, has been going on for about a week.  \ Followup IPF - diagnosis given 11/12/2018 - clinical dx (idiopathic pulmonary fibrosis.  This is based on the pattern of CT called "probable UIP", smoking  history, age 47, presence of clubbing and associated emphysema., considered high risk for bx given 4L O2 use)   HPI Ranie B Preusser 63 y.o. -last seen March 2020.  She was given the diagnosis of IPF at that time and started on nintedanib.  She takes the nintedanib at full dose with only occasional mild diarrhea.  But the biggest problem she is having is that she is having progressive dyspnea that is worse.  Her symptom scores are profoundly high.  She not using oxygen all the time.  Instead she uses it at night and in the daytime as needed.  She determines her need based on subjective feelings.  In the last 1 week or so she started using a pulse oximeter and she is noticing that she is having to require ox throat ygen 4 to 6 L all the time.  She says she is isolating and only goes to [place] but I am not so sure if the history is reliable.  She definitely has quit smoking.  She uses Anoro and this irritates her throat.  Here in the office we found desaturating easily and even at 4 L she was hypoxemic and she needed to be walked at 6 L but even with that she desaturated easily.  There is no edema chest pain  She has follow-up CT scan of the chest and pulmonary function test due in 1 month.    SYMPTOM SCALE - ILD 04/08/2019   O2 use 4L  Shortness of Breath 0 -> 5 scale with 5 being worst (score 6 If unable to do)  At rest 0  Simple tasks - showers, clothes change, eating, shaving 3  Household (dishes, doing bed, laundry) 3  Shopping 2  Walking level at own pace 2  Walking keeping up with others of same age 50  Walking up Stairs Unable - 6  Walking up [employer] - 6  Total (40 - 48) Dyspnea Score 25  How bad is your cough? x  How bad is your fatigue x       Results for MAYBELLINE, KOLARIK (MRN [id number]) as of 11/12/2018 16:04  Ref. Range 08/06/2018 11:38 11/12/2018 14:28  FVC-Pre Latest Units: L 2.53 2.82  FVC-%Pred-Pre Latest Units: % 86  89   Results for Cousineau, Philana B (MRN [id number]) as of 11/12/2018  16:04  Ref. Range 08/06/2018 11:38 11/12/2018 14:28  DLCO unc Latest Units: ml/min/mmHg 9.34 8.97  DLCO unc % pred Latest Units: % 46 46    Simple office walk 250 feet x  3 laps goal with forehead probe 09/26/2018  11/12/2018  04/08/2019   O2 used Room air 4 L slow pace 4L continuous  Number laps completed 2 laps and then needed o2 3   Comments about pace Slow pace Slow pace Slow paced  Resting Pulse Ox/HR 98% and 60/min 97% and 78/min 98% and82/mon  Final Pulse Ox/HR 5% and 117/min 93% and 105/min 86% ad 110/min - half lap  Desaturated </= 88% yes no   Desaturated <= 3% points yes Yes, 4 points   Got Tachycardic >/= 90/min yes yes   Symptoms at end of test Back pain,  x   Miscellaneous comments Did well on 4L at 2 laps, 97% and HR 100.min x Needed 6L [place] to do even do 1 lap and dropped to 87%     ROS - per HPI     has a past medical history of Arthritis, CHF (congestive heart failure) ([place]), Emphysema of lung ([place]), Hyperlipidemia, NSTEMI (non-ST elevated myocardial infarction) ([place]) (06/25/2018), and Tobacco use.   reports that she quit smoking about 7 months ago. Her smoking use included cigarettes. She has a 22.50 pack-year smoking history. She has never used smokeless tobacco.  Past Surgical History:  Procedure Laterality Date  . CESAREAN SECTION     Two  . LEFT HEART CATH AND CORONARY ANGIOGRAPHY N/A 06/26/2018   Procedure: LEFT HEART CATH AND CORONARY ANGIOGRAPHY;  Surgeon: Troy Sine, MD;  Location: [facility];  Service: Cardiovascular;  Laterality: N/A;  . SPINE SURGERY      No Known Allergies  Immunization History  Administered Date(s) Administered  . Influenza Whole 06/24/2018  . Influenza,inj,Quad PF,6+ Mos 07/05/2018  . Pneumococcal Polysaccharide-23 07/18/2018    Family History  Problem Relation Age of Onset  . Cancer Mother   . Cancer Father        Brain tumor  . Diabetes Sister   . Clotting disorder Brother   . Heart disease Maternal Aunt       Current Outpatient Medications:  .  ANORO ELLIPTA 62.5-25 MCG/INH AEPB, TAKE 1 PUFF BY MOUTH EVERY DAY, Disp: 60 each, Rfl: 3 .  apixaban (ELIQUIS) 5 MG TABS tablet, Take 1 tablet (5 mg total) by mouth 2 (two) times daily., Disp: 60 tablet, Rfl: 3 .  FLUoxetine (PROZAC) 40 MG capsule, Take 1 capsule (40 mg total) by mouth daily., Disp: 90 capsule, Rfl: 1 .  gabapentin (NEURONTIN) 600 MG tablet, Take 600 mg by mouth 4 (four) times daily. , Disp: , Rfl:  .  Nintedanib (OFEV) 150 MG CAPS, Take 1 capsule (150 mg total) by mouth 2 (two) times daily., Disp: 60 capsule, Rfl: 11 .  nystatin (MYCOSTATIN/NYSTOP) powder, Apply topically 4 (four) times daily., Disp: 15 g, Rfl: 0 .  nystatin-triamcinolone ointment (MYCOLOG), Apply 1 application topically 2 (two) times daily., Disp: 30 g, Rfl: 0 .  oxyCODONE-acetaminophen (PERCOCET) 10-325 MG per tablet, Take 1 tablet by mouth every 6 (six) hours as needed for pain. , Disp: , Rfl:  .  polyethylene glycol-electrolytes (NULYTELY/GOLYTELY) 420 g solution, Take 4,000 mLs by mouth as directed., Disp: 4000 mL, Rfl: 0 .  albuterol (PROVENTIL HFA;VENTOLIN HFA) 108 (90 Base) MCG/ACT  inhaler, Inhale 2 puffs into the lungs every 6 (six) hours as needed for wheezing or shortness of breath. (Patient not taking: Reported on 04/08/2019), Disp: 1 Inhaler, Rfl: 2      Objective:   Vitals:   04/08/19 1618  BP: 118/68  Pulse: 76  Temp: 97.9 F (36.6 C)  SpO2: 97%  Weight: 185 lb (83.9 kg)  Height: 5\' 5"  (1.651 m)    Estimated body mass index is 30.79 kg/m as calculated from the following:   Height as of this encounter: 5\' 5"  (1.651 m).   Weight as of this encounter: 185 lb (83.9 kg).  @WEIGHTCHANGE @  [employer]   04/08/19 1618  Weight: 185 lb (83.9 kg)     Physical Exam  General Appearance:    Alert, cooperative, no distress, appears stated age - older , Deconditioned looking - yes , OBESE  - yes, Sitting on Wheelchair -  no  Head:     Normocephalic, without obvious abnormality, atraumatic  Eyes:    PERRL, conjunctiva/corneas clear,  Ears:    Normal TM's and external ear canals, both ears  Nose:   Nares normal, septum midline, mucosa normal, no drainage    or sinus tenderness. OXYGEN ON  - YES . Patient is @ 6L  Throat:   Lips, mucosa, and tongue normal; teeth and gums normal. Cyanosis on lips - no  Neck:   Supple, symmetrical, trachea midline, no adenopathy;    thyroid:  no enlargement/tenderness/nodules; no carotid   bruit or JVD  Back:     Symmetric, no curvature, ROM normal, no CVA tenderness  Lungs:     Distress - no , Wheeze no, Barrell Chest - no, Purse lip breathing - no, Crackles - yes at base   Chest Wall:    No tenderness or deformity.    Heart:    Regular rate and rhythm, S1 and S2 normal, no rub   or gallop, Murmur - no  Breast Exam:    NOT DONE  Abdomen:     Soft, non-tender, bowel sounds active all four quadrants,    no masses, no organomegaly. Visceral obesity - yes  Genitalia:   NOT DONE  Rectal:   NOT DONE  Extremities:   Extremities - normal, Has Cane - no, Clubbing - no, Edema - no  Pulses:   2+ and symmetric all extremities  Skin:   Stigmata of Connective Tissue Disease - no  Lymph nodes:   Cervical, supraclavicular, and axillary nodes normal  Psychiatric:  Neurologic:   Pleasant - yes, Anxious - no, Flat affect - yes  CAm-ICU - neg, Alert and Oriented x 3 - yes, Moves all 4s - yes, Speech - normal, Cognition - intact           Assessment:       ICD-10-CM   1. ILD (interstitial lung disease) (HCC)  J84.9 Hepatic function panel    Hepatic function panel  2. IPF (idiopathic pulmonary fibrosis) (HCC)  J84.112 Hepatic function panel    Hepatic function panel  3. Encounter for therapeutic drug monitoring  Z51.81   4. Pulmonary embolism, other, unspecified chronicity, unspecified whether acute cor pulmonale present ([place])  I26.99        Plan:     Patient Instructions  ILD (interstitial  lung disease) ([place]) IPF (idiopathic pulmonary fibrosis) ([place])  - suspect disease is getting worse - my biggest concern is we are running out of options on this - continue oxygen 4-6L at all  times 24/7 - continue ofev 150mg  twice daily  - check LFT blood work 04/08/2019 - move up PFT and CT chest from end of august to middle-august 2020 - follow all covid precautions - avoid people cluster > 6 people and all indoor clusters (churches included)   Pulmonary emphysema, unspecified emphysema type ([place]) -Stable disease - continue anoro  Smoking history - glad you have quit  History Pulmonary Embolus  - continue eliquis  Followup -2-3 weeks with APP to decide next course      SIGNATURE    Dr. Brand Males, M.D., F.C.C.P,  Pulmonary and Critical Care Medicine Staff Physician, [facility] Director - Interstitial Lung Disease  Program  Pulmonary [place] at [place], [place], [address]  Pager: [phone], If no answer or between  15:00h - 7:00h: call [phone] Telephone: [phone]  5:17 PM 04/08/2019

## 2019-04-09 LAB — HEPATIC FUNCTION PANEL
ALT: 13 U/L (ref 0–35)
AST: 23 U/L (ref 0–37)
Albumin: 4 g/dL (ref 3.5–5.2)
Alkaline Phosphatase: 32 U/L — ABNORMAL LOW (ref 39–117)
Bilirubin, Direct: 0.1 mg/dL (ref 0.0–0.3)
Total Bilirubin: 0.4 mg/dL (ref 0.2–1.2)
Total Protein: 7.8 g/dL (ref 6.0–8.3)

## 2019-04-11 ENCOUNTER — Telehealth: Payer: Self-pay | Admitting: Family Medicine

## 2019-04-11 ENCOUNTER — Other Ambulatory Visit: Payer: Self-pay

## 2019-04-11 ENCOUNTER — Other Ambulatory Visit: Payer: Self-pay | Admitting: Physician Assistant

## 2019-04-11 ENCOUNTER — Ambulatory Visit: Payer: PRIVATE HEALTH INSURANCE | Admitting: Family Medicine

## 2019-04-11 ENCOUNTER — Ambulatory Visit (HOSPITAL_COMMUNITY): Payer: 59 | Attending: Internal Medicine

## 2019-04-11 DIAGNOSIS — I513 Intracardiac thrombosis, not elsewhere classified: Secondary | ICD-10-CM | POA: Insufficient documentation

## 2019-04-11 DIAGNOSIS — I240 Acute coronary thrombosis not resulting in myocardial infarction: Secondary | ICD-10-CM

## 2019-04-11 DIAGNOSIS — I519 Heart disease, unspecified: Secondary | ICD-10-CM | POA: Diagnosis not present

## 2019-04-11 MED ORDER — PERFLUTREN LIPID MICROSPHERE
1.0000 mL | INTRAVENOUS | Status: AC | PRN
  Administered 2019-04-11: 2 mL via INTRAVENOUS

## 2019-04-11 NOTE — Telephone Encounter (Signed)
Appt is for a 6 month follow up.  Patient receives pain medication from pain clinic. Patient will be due for refills on prozac.

## 2019-04-11 NOTE — Telephone Encounter (Signed)
I am confused, It looks like she goes to pain clinic for her medication. What is the visit for?

## 2019-04-11 NOTE — Telephone Encounter (Signed)
appt changed to televisit.  Patient aware

## 2019-04-11 NOTE — Telephone Encounter (Signed)
I think Dr. Lajuana Ripple will do that as televist.

## 2019-04-14 ENCOUNTER — Ambulatory Visit (INDEPENDENT_AMBULATORY_CARE_PROVIDER_SITE_OTHER): Payer: Medicare Other | Admitting: Family Medicine

## 2019-04-14 ENCOUNTER — Telehealth: Payer: Self-pay | Admitting: Primary Care

## 2019-04-14 DIAGNOSIS — Z91199 Patient's noncompliance with other medical treatment and regimen due to unspecified reason: Secondary | ICD-10-CM

## 2019-04-14 DIAGNOSIS — Z5329 Procedure and treatment not carried out because of patient's decision for other reasons: Secondary | ICD-10-CM

## 2019-04-14 NOTE — Progress Notes (Signed)
Telephone visit  Subjective: CC: anxiety disorder follow up  I was ultimately able to reach the patient at 10:40 AM.  She is unfortunately in the emergency department due to falling oxygen levels.  I will ask the front office to un-arrive her so that she is not charged for today's visit.  I encouraged her to call us back when she is able to schedule follow-up visit.  Briefly she notes that she is otherwise doing okay and does not need refills.   Location of patient: [facility] ER.

## 2019-04-14 NOTE — Telephone Encounter (Signed)
Called pt and advised message from the provider. Pt understood and verbalized understanding. Nothing further is needed.    

## 2019-04-14 NOTE — Telephone Encounter (Signed)
Patient was discharged . (CT chest neg for PE in ER , progressive Pulmonary Fibrosis ) .  Please call her to set up follow up if she does not already have an office follow up   Please contact office for sooner follow up if symptoms do not improve or worsen or seek emergency care

## 2019-04-14 NOTE — Telephone Encounter (Signed)
Primary Pulmonologist: MR Last office visit and with whom: 04/08/2019 with MR What do we see them for (pulmonary problems): ILD Last OV assessment/plan: Instructions  ILD (interstitial lung disease) ([place]) IPF (idiopathic pulmonary fibrosis) ([place])  - suspect disease is getting worse - my biggest concern is we are running out of options on this - continue oxygen 4-6L at all times 24/7 - continue ofev 150mg  twice daily  - check LFT blood work 04/08/2019 - move up PFT and CT chest from end of august to middle-august 2020 - follow all covid precautions - avoid people cluster > 6 people and all indoor clusters (churches included)   Pulmonary emphysema, unspecified emphysema type ([place]) -Stable disease - continue anoro  Smoking history - glad you have quit  History Pulmonary Embolus  - continue eliquis  Followup -2-3 weeks with APP to decide next course      Was appointment offered to patient (explain)?  Pt currently in ER   Reason for call: Received a call from Dr. Laverta Baltimore with [facility] ER stating that pt was currently there being seen due to worsening SOB and low O2 sats. Dr. Laverta Baltimore can be reached at [phone]. Dr. Laverta Baltimore is wanting to discuss tests with provider.  Tammy, please advise on this. Thanks!

## 2019-04-17 ENCOUNTER — Telehealth: Payer: Self-pay | Admitting: Internal Medicine

## 2019-04-17 NOTE — Telephone Encounter (Signed)
Called and spoke with patient. Patient stated that she was in the ED on 8/3.  Patient stated she was feeling really bad and hasn't taken her Ofev all week.  Ofev was supposed to be 150mg  twice a day.  Patient wanted to know if she should take this medication, if so she wants to know the dose/how many doses.  Pt reported that she is still getting short of breath but she is wearing her oxygen and feels better than she did.  Scheduled patient for a MyChart Video Visit.  Will Route to [facility] as FYI for visit tomorrow. Nothing further needed at this time.

## 2019-04-18 ENCOUNTER — Telehealth (INDEPENDENT_AMBULATORY_CARE_PROVIDER_SITE_OTHER): Payer: 59 | Admitting: Primary Care

## 2019-04-18 ENCOUNTER — Encounter: Payer: Self-pay | Admitting: Primary Care

## 2019-04-18 DIAGNOSIS — J849 Interstitial pulmonary disease, unspecified: Secondary | ICD-10-CM | POA: Diagnosis not present

## 2019-04-18 NOTE — Progress Notes (Signed)
Virtual Visit via Video Note  I connected with Dawn Nichols on 04/18/19 at  2:00 PM EDT by a video enabled telemedicine application and verified that I am speaking with the correct person using two identifiers.  Location: Patient: Home Provider: Office   I discussed the limitations of evaluation and management by telemedicine and the availability of in person appointments. The patient expressed understanding and agreed to proceed.  History of Present Illness: 63 year old female, former smoker. PMH significant for ILD, chronic respiratory failure, centrilobular emphysema, mediastinal adenopathy, pulmonary embolism. Patient of [name], last seen on 04/08/19. ILD felt to be getting worse. PFTS and CT chest moved up to mid-august. Maintained on Anoro. Continues 4-6 L continuously.   04/18/2019 Patient contacted today for video visit with complaints of not feeling well. States that she felt so bad with no energy what so ever. No real specific complaint. Stopped taking OFEV 4 days ago. She is feeling some better after stopping ofev.  Denies cough. Continues 6L oxygen. Having occasional diarrhea. LFTs ok. SHe is scheduled for upcoming PFTs.    Observations/Objective:  - No shortness of breath, wheezing or cough noted during video visit  Assessment and Plan:  IPF: - Suspect disease is worsening - Recent poor antifibrotic tolerance  - Plan Ofev holiday 1-2 weeks until feeling better  - Resume 150mg  once daily x 2 week; then resume 150mg  twice a day  - PFTs scheduled for 8/21 - Continues 6L continuous 24/7  - LFTs ok    Follow Up Instructions:  - FU scheduled for 8/21  I discussed the assessment and treatment plan with the patient. The patient was provided an opportunity to ask questions and all were answered. The patient agreed with the plan and demonstrated an understanding of the instructions.   The patient was advised to call back or seek an in-person evaluation if the symptoms  worsen or if the condition fails to improve as anticipated.  I provided 22 minutes of non-face-to-face time during this encounter.   Martyn Ehrich, NP

## 2019-04-18 NOTE — Patient Instructions (Addendum)
Ofev holiday 1-2 weeks until feeling better   Resume 150mg  once daily x 2 week; then resume 150mg  twice a day   PFTs scheduled for 8/21

## 2019-04-21 ENCOUNTER — Other Ambulatory Visit: Payer: Self-pay | Admitting: Internal Medicine

## 2019-04-22 ENCOUNTER — Encounter (HOSPITAL_COMMUNITY): Payer: Self-pay | Admitting: Emergency Medicine

## 2019-04-22 ENCOUNTER — Other Ambulatory Visit: Payer: Self-pay

## 2019-04-22 ENCOUNTER — Emergency Department (HOSPITAL_COMMUNITY): Payer: 59

## 2019-04-22 ENCOUNTER — Encounter: Payer: Self-pay | Admitting: Primary Care

## 2019-04-22 ENCOUNTER — Telehealth: Payer: Self-pay | Admitting: Primary Care

## 2019-04-22 ENCOUNTER — Inpatient Hospital Stay (HOSPITAL_COMMUNITY)
Admission: EM | Admit: 2019-04-22 | Discharge: 2019-05-04 | DRG: 196 | Disposition: A | Discharge status: Hospice | Payer: 59 | Attending: Internal Medicine | Admitting: Internal Medicine

## 2019-04-22 ENCOUNTER — Ambulatory Visit (INDEPENDENT_AMBULATORY_CARE_PROVIDER_SITE_OTHER): Payer: 59 | Admitting: Primary Care

## 2019-04-22 DIAGNOSIS — J8410 Pulmonary fibrosis, unspecified: Secondary | ICD-10-CM | POA: Diagnosis not present

## 2019-04-22 DIAGNOSIS — I2699 Other pulmonary embolism without acute cor pulmonale: Secondary | ICD-10-CM | POA: Diagnosis not present

## 2019-04-22 DIAGNOSIS — J84112 Idiopathic pulmonary fibrosis: Secondary | ICD-10-CM | POA: Diagnosis present

## 2019-04-22 DIAGNOSIS — R0602 Shortness of breath: Secondary | ICD-10-CM

## 2019-04-22 DIAGNOSIS — R0600 Dyspnea, unspecified: Secondary | ICD-10-CM | POA: Diagnosis not present

## 2019-04-22 DIAGNOSIS — Z87891 Personal history of nicotine dependence: Secondary | ICD-10-CM | POA: Diagnosis not present

## 2019-04-22 DIAGNOSIS — J849 Interstitial pulmonary disease, unspecified: Secondary | ICD-10-CM | POA: Diagnosis not present

## 2019-04-22 DIAGNOSIS — Z7189 Other specified counseling: Secondary | ICD-10-CM

## 2019-04-22 DIAGNOSIS — G629 Polyneuropathy, unspecified: Secondary | ICD-10-CM | POA: Diagnosis present

## 2019-04-22 DIAGNOSIS — Z66 Do not resuscitate: Secondary | ICD-10-CM | POA: Diagnosis not present

## 2019-04-22 DIAGNOSIS — J9611 Chronic respiratory failure with hypoxia: Secondary | ICD-10-CM | POA: Diagnosis not present

## 2019-04-22 DIAGNOSIS — Z9981 Dependence on supplemental oxygen: Secondary | ICD-10-CM | POA: Diagnosis not present

## 2019-04-22 DIAGNOSIS — Z7901 Long term (current) use of anticoagulants: Secondary | ICD-10-CM | POA: Diagnosis not present

## 2019-04-22 DIAGNOSIS — F419 Anxiety disorder, unspecified: Secondary | ICD-10-CM | POA: Diagnosis present

## 2019-04-22 DIAGNOSIS — J432 Centrilobular emphysema: Secondary | ICD-10-CM | POA: Diagnosis present

## 2019-04-22 DIAGNOSIS — R0689 Other abnormalities of breathing: Secondary | ICD-10-CM

## 2019-04-22 DIAGNOSIS — Z79899 Other long term (current) drug therapy: Secondary | ICD-10-CM | POA: Diagnosis not present

## 2019-04-22 DIAGNOSIS — R0902 Hypoxemia: Secondary | ICD-10-CM | POA: Diagnosis present

## 2019-04-22 DIAGNOSIS — Y92230 Patient room in hospital as the place of occurrence of the external cause: Secondary | ICD-10-CM | POA: Diagnosis not present

## 2019-04-22 DIAGNOSIS — T380X5A Adverse effect of glucocorticoids and synthetic analogues, initial encounter: Secondary | ICD-10-CM | POA: Diagnosis not present

## 2019-04-22 DIAGNOSIS — I2510 Atherosclerotic heart disease of native coronary artery without angina pectoris: Secondary | ICD-10-CM | POA: Diagnosis present

## 2019-04-22 DIAGNOSIS — Z20828 Contact with and (suspected) exposure to other viral communicable diseases: Secondary | ICD-10-CM | POA: Diagnosis present

## 2019-04-22 DIAGNOSIS — R739 Hyperglycemia, unspecified: Secondary | ICD-10-CM | POA: Diagnosis not present

## 2019-04-22 DIAGNOSIS — Z79891 Long term (current) use of opiate analgesic: Secondary | ICD-10-CM

## 2019-04-22 DIAGNOSIS — J441 Chronic obstructive pulmonary disease with (acute) exacerbation: Secondary | ICD-10-CM | POA: Diagnosis not present

## 2019-04-22 DIAGNOSIS — I5032 Chronic diastolic (congestive) heart failure: Secondary | ICD-10-CM | POA: Diagnosis present

## 2019-04-22 DIAGNOSIS — J189 Pneumonia, unspecified organism: Secondary | ICD-10-CM | POA: Diagnosis present

## 2019-04-22 DIAGNOSIS — J9621 Acute and chronic respiratory failure with hypoxia: Secondary | ICD-10-CM | POA: Diagnosis present

## 2019-04-22 DIAGNOSIS — I252 Old myocardial infarction: Secondary | ICD-10-CM

## 2019-04-22 DIAGNOSIS — Z01812 Encounter for preprocedural laboratory examination: Secondary | ICD-10-CM | POA: Diagnosis present

## 2019-04-22 DIAGNOSIS — Z720 Tobacco use: Secondary | ICD-10-CM | POA: Diagnosis not present

## 2019-04-22 DIAGNOSIS — J181 Lobar pneumonia, unspecified organism: Secondary | ICD-10-CM | POA: Diagnosis not present

## 2019-04-22 DIAGNOSIS — Z86711 Personal history of pulmonary embolism: Secondary | ICD-10-CM

## 2019-04-22 DIAGNOSIS — Z515 Encounter for palliative care: Secondary | ICD-10-CM

## 2019-04-22 HISTORY — DX: Pulmonary fibrosis, unspecified: J84.10

## 2019-04-22 LAB — CBC WITH DIFFERENTIAL/PLATELET
Abs Immature Granulocytes: 0.03 10*3/uL (ref 0.00–0.07)
Basophils Absolute: 0 10*3/uL (ref 0.0–0.1)
Basophils Relative: 0 %
Eosinophils Absolute: 0.2 10*3/uL (ref 0.0–0.5)
Eosinophils Relative: 1 %
HCT: 42.1 % (ref 36.0–46.0)
Hemoglobin: 13.4 g/dL (ref 12.0–15.0)
Immature Granulocytes: 0 %
Lymphocytes Relative: 15 %
Lymphs Abs: 1.7 10*3/uL (ref 0.7–4.0)
MCH: 29.4 pg (ref 26.0–34.0)
MCHC: 31.8 g/dL (ref 30.0–36.0)
MCV: 92.3 fL (ref 80.0–100.0)
Monocytes Absolute: 0.5 10*3/uL (ref 0.1–1.0)
Monocytes Relative: 4 %
Neutro Abs: 9.1 10*3/uL — ABNORMAL HIGH (ref 1.7–7.7)
Neutrophils Relative %: 80 %
Platelets: 326 10*3/uL (ref 150–400)
RBC: 4.56 MIL/uL (ref 3.87–5.11)
RDW: 12.6 % (ref 11.5–15.5)
WBC: 11.6 10*3/uL — ABNORMAL HIGH (ref 4.0–10.5)
nRBC: 0 % (ref 0.0–0.2)

## 2019-04-22 LAB — TROPONIN I (HIGH SENSITIVITY)
Troponin I (High Sensitivity): 3 ng/L (ref <18)
Troponin I (High Sensitivity): 3 ng/L (ref <18)

## 2019-04-22 LAB — BRAIN NATRIURETIC PEPTIDE: B Natriuretic Peptide: 31 pg/mL (ref 0.0–100.0)

## 2019-04-22 LAB — SARS CORONAVIRUS 2 BY RT PCR (HOSPITAL ORDER, PERFORMED IN ~~LOC~~ HOSPITAL LAB): SARS Coronavirus 2: NEGATIVE

## 2019-04-22 LAB — MRSA PCR SCREENING: MRSA by PCR: NEGATIVE

## 2019-04-22 LAB — COMPREHENSIVE METABOLIC PANEL
ALT: 18 U/L (ref 0–44)
AST: 22 U/L (ref 15–41)
Albumin: 3.2 g/dL — ABNORMAL LOW (ref 3.5–5.0)
Alkaline Phosphatase: 28 U/L — ABNORMAL LOW (ref 38–126)
Anion gap: 11 (ref 5–15)
BUN: 18 mg/dL (ref 8–23)
CO2: 27 mmol/L (ref 22–32)
Calcium: 8.5 mg/dL — ABNORMAL LOW (ref 8.9–10.3)
Chloride: 101 mmol/L (ref 98–111)
Creatinine, Ser: 0.72 mg/dL (ref 0.44–1.00)
GFR calc Af Amer: >60 mL/min (ref >60)
GFR calc non Af Amer: >60 mL/min (ref >60)
Glucose, Bld: 91 mg/dL (ref 70–99)
Potassium: 3.5 mmol/L (ref 3.5–5.1)
Sodium: 139 mmol/L (ref 135–145)
Total Bilirubin: 1.1 mg/dL (ref 0.3–1.2)
Total Protein: 7.1 g/dL (ref 6.5–8.1)

## 2019-04-22 MED ORDER — OXYCODONE-ACETAMINOPHEN 5-325 MG PO TABS
2.0000 | ORAL_TABLET | Freq: Four times a day (QID) | ORAL | Status: DC | PRN
  Administered 2019-04-22 – 2019-05-04 (×31): 2 via ORAL
  Filled 2019-04-22 (×31): qty 2

## 2019-04-22 MED ORDER — GUAIFENESIN ER 600 MG PO TB12
600.0000 mg | ORAL_TABLET | Freq: Two times a day (BID) | ORAL | Status: DC
  Administered 2019-04-22 – 2019-05-03 (×24): 600 mg via ORAL
  Filled 2019-04-22 (×23): qty 1

## 2019-04-22 MED ORDER — APIXABAN 5 MG PO TABS
5.0000 mg | ORAL_TABLET | Freq: Two times a day (BID) | ORAL | Status: DC
  Administered 2019-04-22 – 2019-05-03 (×23): 5 mg via ORAL
  Filled 2019-04-22 (×23): qty 1

## 2019-04-22 MED ORDER — SODIUM CHLORIDE 0.9% FLUSH
3.0000 mL | Freq: Two times a day (BID) | INTRAVENOUS | Status: DC
  Administered 2019-04-22 – 2019-05-03 (×19): 3 mL via INTRAVENOUS

## 2019-04-22 MED ORDER — ACETAMINOPHEN 325 MG PO TABS: 650.0000 mg | ORAL_TABLET | Freq: Four times a day (QID) | ORAL | Status: DC | PRN

## 2019-04-22 MED ORDER — FLUOXETINE HCL 20 MG PO CAPS
40.0000 mg | ORAL_CAPSULE | Freq: Every day | ORAL | Status: DC
  Administered 2019-04-23 – 2019-05-03 (×11): 40 mg via ORAL
  Filled 2019-04-22 (×11): qty 2

## 2019-04-22 MED ORDER — SODIUM CHLORIDE 0.9 % IV SOLN
500.0000 mg | INTRAVENOUS | Status: DC
  Administered 2019-04-22: 14:00:00 500 mg via INTRAVENOUS
  Filled 2019-04-22 (×2): qty 500

## 2019-04-22 MED ORDER — SODIUM CHLORIDE 0.9% FLUSH: 3.0000 mL | INTRAVENOUS | Status: DC | PRN

## 2019-04-22 MED ORDER — POLYETHYLENE GLYCOL 3350 17 G PO PACK: 17.0000 g | PACK | Freq: Every day | ORAL | Status: DC | PRN

## 2019-04-22 MED ORDER — GABAPENTIN 300 MG PO CAPS
600.0000 mg | ORAL_CAPSULE | Freq: Four times a day (QID) | ORAL | Status: DC
  Administered 2019-04-22 – 2019-05-03 (×45): 600 mg via ORAL
  Filled 2019-04-22 (×45): qty 2

## 2019-04-22 MED ORDER — ACETAMINOPHEN 650 MG RE SUPP: 650.0000 mg | Freq: Four times a day (QID) | RECTAL | Status: DC | PRN

## 2019-04-22 MED ORDER — NICOTINE 14 MG/24HR TD PT24
14.0000 mg | MEDICATED_PATCH | Freq: Every day | TRANSDERMAL | Status: DC
  Filled 2019-04-22 (×3): qty 1

## 2019-04-22 MED ORDER — ALBUTEROL SULFATE (2.5 MG/3ML) 0.083% IN NEBU
2.5000 mg | INHALATION_SOLUTION | RESPIRATORY_TRACT | Status: DC | PRN
  Administered 2019-05-03: 2.5 mg via RESPIRATORY_TRACT
  Filled 2019-04-22: qty 3

## 2019-04-22 MED ORDER — SODIUM CHLORIDE 0.9 % IV SOLN
1.0000 g | INTRAVENOUS | Status: DC
  Administered 2019-04-22 – 2019-04-28 (×7): 1 g via INTRAVENOUS
  Filled 2019-04-22 (×8): qty 10

## 2019-04-22 MED ORDER — ONDANSETRON HCL 4 MG/2ML IJ SOLN: 4.0000 mg | Freq: Four times a day (QID) | INTRAMUSCULAR | Status: DC | PRN

## 2019-04-22 MED ORDER — METHYLPREDNISOLONE SODIUM SUCC 40 MG IJ SOLR
40.0000 mg | Freq: Three times a day (TID) | INTRAMUSCULAR | Status: DC
  Administered 2019-04-22 – 2019-04-24 (×6): 40 mg via INTRAVENOUS
  Filled 2019-04-22 (×6): qty 1

## 2019-04-22 MED ORDER — SODIUM CHLORIDE 0.9 % IV SOLN: 250.0000 mL | INTRAVENOUS | Status: DC | PRN

## 2019-04-22 MED ORDER — CHLORHEXIDINE GLUCONATE CLOTH 2 % EX PADS
6.0000 | MEDICATED_PAD | Freq: Every day | CUTANEOUS | Status: DC
  Administered 2019-04-23 – 2019-04-29 (×7): 6 via TOPICAL

## 2019-04-22 MED ORDER — UMECLIDINIUM-VILANTEROL 62.5-25 MCG/INH IN AEPB
1.0000 | INHALATION_SPRAY | Freq: Every day | RESPIRATORY_TRACT | Status: DC
  Administered 2019-04-23 – 2019-04-29 (×6): 1 via RESPIRATORY_TRACT
  Filled 2019-04-22 (×4): qty 14

## 2019-04-22 MED ORDER — ONDANSETRON HCL 4 MG PO TABS: 4.0000 mg | ORAL_TABLET | Freq: Four times a day (QID) | ORAL | Status: DC | PRN

## 2019-04-22 MED ORDER — TRAZODONE HCL 50 MG PO TABS
50.0000 mg | ORAL_TABLET | Freq: Every evening | ORAL | Status: DC | PRN
  Administered 2019-04-26 – 2019-05-02 (×7): 50 mg via ORAL
  Filled 2019-04-22 (×7): qty 1

## 2019-04-22 MED ORDER — PEG 3350-KCL-NA BICARB-NACL 420 G PO SOLR: 4000.0000 mL | ORAL | Status: DC

## 2019-04-22 MED ORDER — NINTEDANIB ESYLATE 150 MG PO CAPS
150.0000 mg | ORAL_CAPSULE | Freq: Two times a day (BID) | ORAL | Status: DC
  Administered 2019-04-23 – 2019-05-03 (×22): 150 mg via ORAL
  Filled 2019-04-22 (×9): qty 1

## 2019-04-22 MED ORDER — DOXYCYCLINE HYCLATE 100 MG PO TABS
100.0000 mg | ORAL_TABLET | Freq: Two times a day (BID) | ORAL | Status: DC
  Administered 2019-04-22 – 2019-04-29 (×15): 100 mg via ORAL
  Filled 2019-04-22 (×15): qty 1

## 2019-04-22 MED ORDER — ENSURE ENLIVE PO LIQD
237.0000 mL | Freq: Two times a day (BID) | ORAL | Status: DC
  Administered 2019-04-24 – 2019-05-03 (×11): 237 mL via ORAL

## 2019-04-22 MED ORDER — SODIUM CHLORIDE 0.9 % IV SOLN: 1.0000 g | INTRAVENOUS | Status: DC

## 2019-04-22 NOTE — ED Provider Notes (Signed)
[facility] EMERGENCY DEPARTMENT Provider Note   CSN: [id number] Arrival date & time: 04/22/19  1047    History   Chief Complaint Chief Complaint  Patient presents with  . Shortness of Breath    HPI Dawn Nichols is a 63 y.o. female, PMH ILD, chronic respiratory failure, centrilobular emphysema, mediastinal adenopathy, PE dx 2019, CHF, CAD.  History provided by patient.  Patient reports that for the last week, she has been having worsening in her chronic shortness of breath.  She states that this mostly occurs with exertion, but also occurring at rest in the last few days.  Endorses a chronic cough, no worsening in this.  Denies fevers, chills, congestion, sore throat, loss of taste or smell.  Denies known sick contacts or known COVID positive contacts.  Chronically on 6L at home.  Reports desats to 60-70s on 6L with activity this a.m. and increased O2 to 8L.  Had virtual visit today with Pulmonology NP and was advised to call EMS and come to ED for further eval.  Had been taking OFEV and was discontinued on 8/3 by patient because she stated she thought this was the cause of her worsening shortness of breath, then on 8/7 was restarted.  She has a history of pulmonary embolus reports compliance with Eliquis 5 mg twice daily.  She denies chest pain, does note mild pressure when she has difficulty breathing no nausea, vomiting GI, GU complaints.  Denies swelling of her lower extremities.  Last Echo 04/11/2019, EF 60-65%, pseudonormal diastolic function.  Per EMS report, patient hypoxic to 78% on 8 L at home, placed on nonrebreather and sats increased to 90s.  Past Medical History:  Diagnosis Date  . Arthritis   . CHF (congestive heart failure) ([place])   . Emphysema of lung ([place])   . Hyperlipidemia   . NSTEMI (non-ST elevated myocardial infarction) ([place]) 06/25/2018  . Pulmonary fibrosis ([place])   . Tobacco use     Patient Active Problem List   Diagnosis Date Noted  . PNA (pneumonia)  04/22/2019  . 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 Stress and adjustment reaction 08/28/2018  . Drug-induced constipation 08/20/2018  . Lipoma of torso 08/20/2018  . Mediastinal adenopathy 08/06/2018  . Hilar adenopathy 08/06/2018  . Tobacco abuse 07/18/2018  . Healthcare maintenance 07/18/2018  . Chronic respiratory failure with hypoxia ([place]) 07/18/2018  . Thrombus of right ventricle without MI 06/26/2018  . Lung mass   . Hypoxemia   . Ground glass opacity present on imaging of lung   . Pulmonary embolism ([place]) 06/25/2018  . Acute CHF (congestive heart failure) ([place]) 06/25/2018  . Centrilobular emphysema ([place]) 06/25/2018  . History of non-ST elevation myocardial infarction (NSTEMI) 06/25/2018  . Chronic back pain 01/01/2014  . Scoliosis 01/01/2014  . Peripheral neuropathy 01/01/2014    Past Surgical History:  Procedure Laterality Date  . CESAREAN SECTION     Two  . LEFT HEART CATH AND CORONARY ANGIOGRAPHY N/A 06/26/2018   Procedure: LEFT HEART CATH AND CORONARY ANGIOGRAPHY;  Surgeon: Troy Sine, MD;  Location: [place] [facility];  Service: Cardiovascular;  Laterality: N/A;  . SPINE SURGERY       OB History   No obstetric history on file.      Home Medications    Prior to Admission medications   Medication Sig Start Date End Date Taking? Authorizing Provider  albuterol (PROVENTIL HFA;VENTOLIN HFA) 108 (90 Base) MCG/ACT inhaler Inhale 2 puffs into the lungs every 6 (six) hours as  needed for wheezing or shortness of breath. 06/28/18   Pokhrel, Corrie Mckusick, MD  ANORO ELLIPTA 62.5-25 MCG/INH AEPB TAKE 1 PUFF BY MOUTH EVERY DAY 10/31/18   Lauraine Rinne, NP  apixaban (ELIQUIS) 5 MG TABS tablet Take 1 tablet (5 mg total) by mouth 2 (two) times daily. 01/31/19   [name], DO  FLUoxetine (PROZAC) 40 MG capsule Take 1 capsule (40 mg total) by mouth daily. 12/16/18   [name], DO  gabapentin (NEURONTIN) 600 MG tablet Take 600 mg by mouth 4 (four) times daily.   12/07/13   [provider]  Nintedanib (OFEV) 150 MG CAPS Take 1 capsule (150 mg total) by mouth 2 (two) times daily. 12/03/18   Brand Males, MD  nystatin (MYCOSTATIN/NYSTOP) powder Apply topically 4 (four) times daily. 11/05/18   Sharion Balloon, FNP  nystatin-triamcinolone ointment (MYCOLOG) Apply 1 application topically 2 (two) times daily. 11/05/18   Sharion Balloon, FNP  oxyCODONE-acetaminophen (PERCOCET) 10-325 MG per tablet Take 1 tablet by mouth every 6 (six) hours as needed for pain.  12/12/13   [provider]  polyethylene glycol-electrolytes (NULYTELY/GOLYTELY) 420 g solution Take 4,000 mLs by mouth as directed. 10/09/18   Milus Banister, MD    Family History Family History  Problem Relation Age of Onset  . Cancer Mother   . Cancer Father        Brain tumor  . Diabetes Sister   . Clotting disorder Brother   . Heart disease Maternal Aunt     Social History Social History   Tobacco Use  . Smoking status: Former Smoker    Packs/day: 0.50    Years: 45.00    Pack years: 22.50    Types: Cigarettes    Quit date: 08/13/2018    Years since quitting: 0.6  . Smokeless tobacco: Never Used  . Tobacco comment: 4-5 cigarettes per day 11.26.19  Substance Use Topics  . Alcohol use: Not Currently  . Drug use: Never     Allergies   Patient has no known allergies.   Review of Systems Review of Systems  Constitutional: Negative for appetite change, chills and fever.  HENT: Negative for congestion and sore throat.   Eyes: Negative for visual disturbance.  Respiratory: Positive for cough, chest tightness and shortness of breath.   Cardiovascular: Negative for chest pain, palpitations and leg swelling.  Gastrointestinal: Negative for abdominal pain, blood in stool, constipation, diarrhea, nausea and vomiting.  Genitourinary: Negative for difficulty urinating, dysuria and hematuria.  Musculoskeletal: Positive for back pain (chronic).  Neurological: Positive  for dizziness (with SOB) and headaches (with SOB). Negative for numbness.     Physical Exam Updated Vital Signs BP 132/84 (BP Location: Right Arm)   Pulse 88   Temp 98.3 F (36.8 C) (Oral)   Resp 18   Ht 5\' 3"  (1.6 m)   Wt 83.9 kg   SpO2 97%   BMI 32.77 kg/m   Physical Exam Constitutional:      General: She is not in acute distress.    Appearance: She is well-developed.     Comments: Chronically ill appearing  HENT:     Head: Normocephalic and atraumatic.     Mouth/Throat:     Mouth: Mucous membranes are moist.  Neck:     Musculoskeletal: Normal range of motion and neck supple.  Cardiovascular:     Rate and Rhythm: Normal rate and regular rhythm.     Heart sounds: No murmur.  Pulmonary:  Effort: Tachypnea present.     Breath sounds: No wheezing or rhonchi.     Comments: Bibasilar crackles Chest:     Chest wall: No tenderness.  Abdominal:     General: Bowel sounds are normal.     Palpations: Abdomen is soft.     Tenderness: There is no abdominal tenderness. There is no guarding.  Musculoskeletal:     Right lower leg: Edema (trace) present.     Left lower leg: Edema (trace) present.  Lymphadenopathy:     Cervical: No cervical adenopathy.  Skin:    Comments: telangiectasias of BLE  Neurological:     Mental Status: She is alert and oriented to person, place, and time.  Psychiatric:        Mood and Affect: Mood normal.        Behavior: Behavior normal.      ED Treatments / Results  Labs (all labs ordered are listed, but only abnormal results are displayed) Labs Reviewed  CBC WITH DIFFERENTIAL/PLATELET - Abnormal; Notable for the following components:      Result Value   WBC 11.6 (*)    Neutro Abs 9.1 (*)    All other components within normal limits  COMPREHENSIVE METABOLIC PANEL - Abnormal; Notable for the following components:   Calcium 8.5 (*)    Albumin 3.2 (*)    Alkaline Phosphatase 28 (*)    All other components within normal limits  SARS  CORONAVIRUS 2 (HOSPITAL ORDER, [place] [facility])  BRAIN NATRIURETIC PEPTIDE  TROPONIN I (HIGH SENSITIVITY)  TROPONIN I (HIGH SENSITIVITY)    EKG None  Radiology Dg Chest Portable 1 View  Result Date: 04/22/2019 CLINICAL DATA:  Shortness of breath EXAM: PORTABLE CHEST 1 VIEW COMPARISON:  04/14/2019 FINDINGS: Cardiac silhouette is stable, appearing mildly enlarged. Diffuse chronic interstitial lung changes, with increasing interstitial opacity. More focal airspace consolidation in the right lower lobe. No pneumothorax. IMPRESSION: 1. Right lower lobe airspace consolidation suspicious for pneumonia in the appropriate clinical setting. 2. Diffuse chronic interstitial changes with suggestion of superimposed interstitial edema. Electronically Signed   By: Davina Poke M.D.   On: 04/22/2019 11:33    Procedures Procedures (including critical care time)  Medications Ordered in ED Medications  cefTRIAXone (ROCEPHIN) 1 g in sodium chloride 0.9 % 100 mL IVPB (1 g Intravenous New Bag/Given 04/22/19 1311)  azithromycin (ZITHROMAX) 500 mg in sodium chloride 0.9 % 250 mL IVPB (has no administration in time range)     Initial Impression / Assessment and Plan / ED Course  I have reviewed the triage vital signs and the nursing notes.  Pertinent labs & imaging results that were available during my care of the patient were reviewed by me and considered in my medical decision making (see chart for details).  Maila B Leonardo is a 63 yo female with PMH ILD, centrilobular emphysema, chronic respiratory failure requiring 6 L O2 per [place], prior PE 2019 on Eliquis, CAD, who presents with worsening shortness of breath in the last week and worsening of respiratory failure with increasing oxygen requirement to 8 L per [address] and desaturations to the 60s and 70s at home.  Via EMS, patient required nonrebreather, but is currently satting comfortably on 8 L per [place].  She does have bibasilar crackles on exam  and chest x-ray is consistent with right greater than left lobe pneumonia.  Will obtain basic labs including CMP, CBC, COVID.  Will start on CTX and zithromax for CAP, verified with  pharmacy she does not need broader coverage at this time.  She notes some chest pressure associated with shortness of breath, will obtain troponin to rule out ACS given history of CAD, EKG is without acute changes or evidence of STEMI.  Wells score 1.5 given history of PE, patient reports complaince with Eliquis, therefore PE unlikely.  Will obtain BNP to rule out CHF, given some edema noted on CXR, did have Echo WNL on 7/31.  Patient will likely require admission for CAP and acute on chronic respiratory failure.  WBC mildly elevated to 11.6 with ANC of 9.1.  CMP without acute abnormalities, troponin negative.  Rapid COVID negative.  Given the chest x-ray consistent with pneumonia and patient has acute on chronic hypoxic respiratory failure, will consult hospitalist for admission.  BNP still pending at this time.  Spoke with [facility] who agrees to admit patient.   Final Clinical Impressions(s) / ED Diagnoses   Final diagnoses:  Community acquired pneumonia of right lower lobe of lung [facility])    ED Discharge Orders    None       Cleophas Dunker, DO 04/22/19 1409    Elnora Morrison, MD 04/22/19 1554

## 2019-04-22 NOTE — Patient Instructions (Signed)
Advised ED via EMS

## 2019-04-22 NOTE — ED Triage Notes (Signed)
Per EMS, pt with PMH for COPD and pulmonary fibrosis c/o worsening SOB. EMS reports that she was 78% on 8L oxygen at home. Pt placed on NRB and sats increased into 90s. Pt states she is normally on 6L. Pt labored on assessment. Pt reports productive cough. Denies fever, chills, HA, sore throat, or loss of taste/smell. Denies COVID contacts.

## 2019-04-22 NOTE — Telephone Encounter (Signed)
[name]  Patient called office not feeling well, she self stopped OFEV 1 week ago and was starting to feel better. Plan restart 1 tab daily and increased back to 2 tabs.   She then called today with complaints of sob and needing more oxygen. Desaturating on 6L to 60-70's with min activity. Increased oxygen to 8L. I advised ED eval, she will be going to [facility] via EMS.

## 2019-04-22 NOTE — Progress Notes (Signed)
Switched patient over to small bipap mask.

## 2019-04-22 NOTE — Progress Notes (Signed)
Virtual Visit via Telephone Note  I connected with Dawn Nichols on 04/22/19 at  9:00 AM EDT by telephone and verified that I am speaking with the correct person using two identifiers.  Location: Patient: Home Provider: Office   I discussed the limitations, risks, security and privacy concerns of performing an evaluation and management service by telephone and the availability of in person appointments. I also discussed with the patient that there may be a patient responsible charge related to this service. The patient expressed understanding and agreed to proceed.   History of Present Illness: 63 year old female, former smoker. PMH significant for ILD, chronic respiratory failure, centrilobular emphysema, mediastinal adenopathy, pulmonary embolism. Patient of [name], last seen on 04/08/19. ILD felt to be getting worse. PFTS and CT chest moved up to mid-august. Maintained on [place]. Continues 4-6 L continuously.   04/18/2019 Patient contacted today for video visit with complaints of not feeling well. States that she felt so bad with no energy what so ever. No real specific complaint. Stopped taking OFEV 4 days ago. She is feeling some better after stopping ofev.  Denies cough. Continues 6L oxygen. Having occasional diarrhea. LFTs ok. She is scheduled for upcoming PFTs.   04/22/2019 Patient contacted today for televisit, she had trouble logging on to video today. Patient reports not feeling. O2 drops with any activity, desats 60-70s on 6L. She had to turn her oxygen up to 8L. She does not feel short of breath. She has mostly been staying in bed. Called EMS on Sunday 8/7 and they came to her house. Her vitals looked fine but she declined going to the hospital. She resumed her OFEV 2 days ago. Patient is a full code, advised ED eval via EMS and patient agreeing. She will call office back if she does not end up going.   Observations/Objective:  - Appears moderately short of breath over the  phone  Assessment and Plan:  ILD - Worsening shortness of breath and increased oxygen demand - Resumed OFEV on 8/7 1 tab x 1 week; then increased back to 2 tabs  - Advised ED evaluation via EMS and patient agreeing; likely needs CXR, labs, bronchodilators, IV steroids and covid testing   Follow Up Instructions:  -Will needs ED follow-up after discharged  I discussed the assessment and treatment plan with the patient. The patient was provided an opportunity to ask questions and all were answered. The patient agreed with the plan and demonstrated an understanding of the instructions.   The patient was advised to call back or seek an in-person evaluation if the symptoms worsen or if the condition fails to improve as anticipated.  I provided 15 minutes of non-face-to-face time during this encounter.   Martyn Ehrich, NP

## 2019-04-22 NOTE — H&P (Addendum)
Patient Demographics:    Dawn Nichols, is a 63 y.o. female  MRN: [id number]   DOB - [birth year]  Admit Date - 04/22/2019  Outpatient Primary MD for the patient is [name], DO   Assessment & Plan:    Principal Problem:   PNA (pneumonia) Active Problems:   Centrilobular emphysema (HCC)   Tobacco abuse   Chronic respiratory failure with hypoxia due to COPD/ILD-- on 6L/min at Home   H/o Pulmonary embolism - currently on Eliquis   Chronic heart failure with preserved ejection fraction (HFpEF)/dCHF -EF 60 to 65%    1) acute on chronic hypoxic respiratory failure--- due to right-sided pneumonia -Place in stepdown unit for BiPAP due to persistent hypoxia and increased work of breathing despite high flow oxygen -IV Rocephin and doxycycline as ordered, mucolytics bronchodilators and supplemental oxygen as ordered  2) acute COPD exacerbation/underlying ILD--- COPD exacerbation secondary to pneumonia--IV Solu-Medrol, bronchodilators mucolytics as ordered -IV antibiotics as above -BiPAP and supplemental oxygen as above -c/n OFEV for ILD  3) social/ethics--- patient is a full code  4) chronic anticoagulation due to history of PE--- continue Eliquis  5) anxiety disorder--- continue Prozac  6) tobacco abuse--nicotine patch as ordered  7)HFpEF--- last known EF 60 to 65%, patient with chronic diastolic dysfunction CHF--- currently appears euvolemic, no CHF exacerbation clinically and radiologically at this time  With History of - Reviewed by me  Past Medical History:  Diagnosis Date  . Arthritis   . CHF (congestive heart failure) ([place])   . Emphysema of lung ([place])   . Hyperlipidemia   . NSTEMI (non-ST elevated myocardial infarction) ([place]) 06/25/2018  . Pulmonary fibrosis ([place])   . Tobacco use        Past Surgical History:  Procedure Laterality Date  . CESAREAN SECTION     Two  . LEFT HEART CATH AND CORONARY ANGIOGRAPHY N/A 06/26/2018   Procedure: LEFT HEART CATH AND CORONARY ANGIOGRAPHY;  Surgeon: Troy Sine, MD;  Location: [facility];  Service: Cardiovascular;  Laterality: N/A;  . SPINE SURGERY        Chief Complaint  Patient presents with  . 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, manufacturing systems with past medical history relevant for ILD/IPF, centrilobular emphysema, mediastinal adenopathy, pulmonary embolism on chronic anticoagulation with Eliquis, chronic hypoxic respiratory failure on 6 L of oxygen at home presents with worsening respiratory symptoms and significant hypoxia--when EMS got to patient home O2 sats was 78% on 8 L  --- She has had increased productive cough, fatigue, malaise, chills, no high fevers  In ED chest x-ray suggestive of right-sided pneumonia -WBC is 11.6 -COVID-19 negative ---Troponin negative, BNP not elevated,  -Despite high flow oxygen hypoxia and increased work of breathing persisted so patient will be admitted to stepdown for BiPAP initiation  -Denies chest pains, denies pleuritic symptoms, patient tells  me she has been compliant with Eliquis    Review of systems:    In addition to the HPI above,   A full Review of  Systems was done, all other systems reviewed are negative except as noted above in HPI , .    Social History:  Reviewed by me    Social History   Tobacco Use  . Smoking status: Former Smoker    Packs/day: 0.50    Years: 45.00    Pack years: 22.50    Types: Cigarettes    Quit date: 08/13/2018    Years since quitting: 0.6  . Smokeless tobacco: Never Used  . Tobacco comment: 4-5 cigarettes per day 11.26.19  Substance Use Topics  . Alcohol use: Not Currently       Family History :  Reviewed by me   Family History  Problem Relation Age of Onset  . Cancer Mother    . Cancer Father        Brain tumor  . Diabetes Sister   . Clotting disorder Brother   . Heart disease Maternal Aunt      Home Medications:   Prior to Admission medications   Medication Sig Start Date End Date Taking? Authorizing Provider  albuterol (PROVENTIL HFA;VENTOLIN HFA) 108 (90 Base) MCG/ACT inhaler Inhale 2 puffs into the lungs every 6 (six) hours as needed for wheezing or shortness of breath. 06/28/18  Yes Pokhrel, Laxman, MD  ANORO ELLIPTA 62.5-25 MCG/INH AEPB TAKE 1 PUFF BY MOUTH EVERY DAY Patient taking differently: Inhale 1 puff into the lungs daily.  10/31/18  Yes Lauraine Rinne, NP  apixaban (ELIQUIS) 5 MG TABS tablet Take 1 tablet (5 mg total) by mouth 2 (two) times daily. 01/31/19  Yes Gottschalk, Ashly M, DO  FLUoxetine (PROZAC) 40 MG capsule Take 1 capsule (40 mg total) by mouth daily. 12/16/18  Yes Gottschalk, Leatrice Jewels M, DO  gabapentin (NEURONTIN) 300 MG capsule Take 300-600 mg by mouth 4 (four) times daily.  12/07/13  Yes [provider]  Nintedanib (OFEV) 150 MG CAPS Take 1 capsule (150 mg total) by mouth 2 (two) times daily. 12/03/18  Yes Brand Males, MD  oxyCODONE-acetaminophen (PERCOCET) 10-325 MG per tablet Take 1 tablet by mouth every 6 (six) hours as needed for pain.  12/12/13  Yes [provider]  nystatin (MYCOSTATIN/NYSTOP) powder Apply topically 4 (four) times daily. Patient taking differently: Apply topically 4 (four) times daily as needed (for irritation).  11/05/18   Sharion Balloon, FNP  nystatin-triamcinolone ointment (MYCOLOG) Apply 1 application topically 2 (two) times daily. Patient taking differently: Apply 1 application topically 2 (two) times daily as needed (for irritation).  11/05/18   Sharion Balloon, FNP     Allergies:    No Known Allergies   Physical Exam:   Vitals  Blood pressure (!) 143/90, pulse 88, temperature 98.3 F (36.8 C), temperature source Oral, resp. rate 18, height 5\' 3"  (1.6 m), weight 83.9 kg, SpO2 90 %.   Physical Examination: General appearance - alert, chronically ill appearing, and significant conversational dyspnea and increased work of breathing Mental status - alert, oriented to person, place, and time,  Eyes - sclera anicteric Nose/Mouth--- high flow oxygen transitioning to BiPAP mask Neck - supple, no JVD elevation , Chest -diminished air movement, Velcro type rales, scattered wheezes and rhonchi  heart - S1 and S2 normal, regular  Abdomen - soft, nontender, nondistended, no masses or organomegaly Neurological - screening mental status exam normal, neck supple without  rigidity, cranial nerves II through XII intact, DTR's normal and symmetric Extremities - no pedal edema noted, intact peripheral pulses  Skin - warm, dry     Data Review:    CBC Recent Labs  Lab 04/22/19 1229  WBC 11.6*  HGB 13.4  HCT 42.1  PLT 326  MCV 92.3  MCH 29.4  MCHC 31.8  RDW 12.6  LYMPHSABS 1.7  MONOABS 0.5  EOSABS 0.2  BASOSABS 0.0   ------------------------------------------------------------------------------------------------------------------  Chemistries  Recent Labs  Lab 04/22/19 1229  NA 139  K 3.5  CL 101  CO2 27  GLUCOSE 91  BUN 18  CREATININE 0.72  CALCIUM 8.5*  AST 22  ALT 18  ALKPHOS 28*  BILITOT 1.1   ------------------------------------------------------------------------------------------------------------------ estimated creatinine clearance is 75.8 mL/min (by C-G formula based on SCr of 0.72 mg/dL). ------------------------------------------------------------------------------------------------------------------ No results for input(s): TSH, T4TOTAL, T3FREE, THYROIDAB in the last 72 hours.  Invalid input(s): FREET3   Coagulation profile No results for input(s): INR, PROTIME in the last 168 hours. ------------------------------------------------------------------------------------------------------------------- No results for input(s): DDIMER in the last 72  hours. -------------------------------------------------------------------------------------------------------------------  Cardiac Enzymes No results for input(s): CKMB, TROPONINI, MYOGLOBIN in the last 168 hours.  Invalid input(s): CK ------------------------------------------------------------------------------------------------------------------    Component Value Date/Time   BNP 31.0 04/22/2019 1229     ---------------------------------------------------------------------------------------------------------------  Urinalysis    Component Value Date/Time   COLORURINE AMBER (A) 06/25/2018 0409   APPEARANCEUR CLEAR 06/25/2018 0409   LABSPEC 1.039 (H) 06/25/2018 0409   PHURINE 6.0 06/25/2018 0409   GLUCOSEU NEGATIVE 06/25/2018 0409   HGBUR NEGATIVE 06/25/2018 0409   BILIRUBINUR NEGATIVE 06/25/2018 0409   KETONESUR NEGATIVE 06/25/2018 0409   PROTEINUR 30 (A) 06/25/2018 0409   NITRITE NEGATIVE 06/25/2018 0409   LEUKOCYTESUR NEGATIVE 06/25/2018 0409    ----------------------------------------------------------------------------------------------------------------   Imaging Results:    Dg Chest Portable 1 View  Result Date: 04/22/2019 CLINICAL DATA:  Shortness of breath EXAM: PORTABLE CHEST 1 VIEW COMPARISON:  04/14/2019 FINDINGS: Cardiac silhouette is stable, appearing mildly enlarged. Diffuse chronic interstitial lung changes, with increasing interstitial opacity. More focal airspace consolidation in the right lower lobe. No pneumothorax. IMPRESSION: 1. Right lower lobe airspace consolidation suspicious for pneumonia in the appropriate clinical setting. 2. Diffuse chronic interstitial changes with suggestion of superimposed interstitial edema. Electronically Signed   By: Davina Poke M.D.   On: 04/22/2019 11:33    Radiological Exams on Admission: Dg Chest Portable 1 View  Result Date: 04/22/2019 CLINICAL DATA:  Shortness of breath EXAM: PORTABLE CHEST 1 VIEW COMPARISON:   04/14/2019 FINDINGS: Cardiac silhouette is stable, appearing mildly enlarged. Diffuse chronic interstitial lung changes, with increasing interstitial opacity. More focal airspace consolidation in the right lower lobe. No pneumothorax. IMPRESSION: 1. Right lower lobe airspace consolidation suspicious for pneumonia in the appropriate clinical setting. 2. Diffuse chronic interstitial changes with suggestion of superimposed interstitial edema. Electronically Signed   By: Davina Poke M.D.   On: 04/22/2019 11:33    DVT Prophylaxis -SCD /Eliquis AM Labs Ordered, also please review Full Orders  Family Communication: Admission, patients condition and plan of care including tests being ordered have been discussed with the patient who indicate understanding and agree with the plan   Code Status - Full Code  Likely DC to  TBD  Condition   guarded  Roxan Hockey M.D on 04/22/2019 at 5:17 PM Go to [email or web address] -  for contact info  [facility] - Office  [phone]

## 2019-04-22 NOTE — Telephone Encounter (Signed)
Sounds good thanks

## 2019-04-23 ENCOUNTER — Ambulatory Visit (HOSPITAL_COMMUNITY): Payer: 59

## 2019-04-23 DIAGNOSIS — J189 Pneumonia, unspecified organism: Secondary | ICD-10-CM

## 2019-04-23 DIAGNOSIS — J432 Centrilobular emphysema: Secondary | ICD-10-CM

## 2019-04-23 LAB — BASIC METABOLIC PANEL
Anion gap: 12 (ref 5–15)
BUN: 18 mg/dL (ref 8–23)
CO2: 25 mmol/L (ref 22–32)
Calcium: 9.1 mg/dL (ref 8.9–10.3)
Chloride: 102 mmol/L (ref 98–111)
Creatinine, Ser: 0.5 mg/dL (ref 0.44–1.00)
GFR calc Af Amer: >60 mL/min (ref >60)
GFR calc non Af Amer: >60 mL/min (ref >60)
Glucose, Bld: 132 mg/dL — ABNORMAL HIGH (ref 70–99)
Potassium: 3.9 mmol/L (ref 3.5–5.1)
Sodium: 139 mmol/L (ref 135–145)

## 2019-04-23 LAB — CBC
HCT: 41.9 % (ref 36.0–46.0)
Hemoglobin: 13.4 g/dL (ref 12.0–15.0)
MCH: 29.3 pg (ref 26.0–34.0)
MCHC: 32 g/dL (ref 30.0–36.0)
MCV: 91.7 fL (ref 80.0–100.0)
Platelets: 354 10*3/uL (ref 150–400)
RBC: 4.57 MIL/uL (ref 3.87–5.11)
RDW: 12.4 % (ref 11.5–15.5)
WBC: 12.1 10*3/uL — ABNORMAL HIGH (ref 4.0–10.5)
nRBC: 0 % (ref 0.0–0.2)

## 2019-04-23 MED ORDER — ADULT MULTIVITAMIN W/MINERALS CH
1.0000 | ORAL_TABLET | Freq: Every day | ORAL | Status: DC
  Administered 2019-04-23 – 2019-05-03 (×11): 1 via ORAL
  Filled 2019-04-23 (×11): qty 1

## 2019-04-23 MED ORDER — POLYETHYLENE GLYCOL 3350 17 G PO PACK
17.0000 g | PACK | Freq: Every day | ORAL | Status: DC
  Filled 2019-04-23 (×2): qty 1

## 2019-04-23 NOTE — Progress Notes (Signed)
Turned patient's ipap and epap pressures up to help with respiratory rate and O2 sat.

## 2019-04-23 NOTE — Progress Notes (Signed)
Removed from Bipap at this time and placed on 10lpm [place]. Initially SPO2 dropped to 84 but increased to 91%. She decreases with any slight movement. She is trying to eat and will go back on the Bipap after she eats.

## 2019-04-23 NOTE — Progress Notes (Signed)
Initial Nutrition Assessment  DOCUMENTATION CODES:      INTERVENTION:  Ensure Enlive po BID, each supplement provides 350 kcal and 20 grams of protein   Heart Healthy diet  NUTRITION DIAGNOSIS:   Increased nutrient needs related to acute illness, chronic illness(COPD-Empysema, pulmonary fibrosis, PNA) as evidenced by per patient/family report, meal completion < 50%.  GOAL:   Provide needs based on ASPEN/SCCM guidelines   MONITOR:   PO intake, Supplement acceptance, Labs, Weight trends, I & O's   REASON FOR ASSESSMENT:   Malnutrition Screening Tool    ASSESSMENT: Patient is a 63 yo female with hx of COPD (emphysema) and pulmonary fibrosis,HF (EF-60-65%), NSTEMI, and tobacco smoker. She presents with right sided pneumonia, acute COPD exacerbation.   Meal intake 0% today.  Able to feed herself but per patient has a diminished appetite and ability to eat well due to her breathing difficulty.  Acute wt change the past 2 weeks. Patient history shows loss of 4% (3.7 kg). Patient also has a hx of HF and subject to fluctuations in fluid status.  Medications reviewed and include: Miralax,  Solumedrol, Nicoderm and Rocephin     Labs: BMP Latest Ref Rng & Units 04/23/2019 04/22/2019 10/11/2018  Glucose 70 - 99 mg/dL 132(H) 91 100(H)  BUN 8 - 23 mg/dL 18 18 10   Creatinine 0.44 - 1.00 mg/dL 0.50 0.72 0.64  BUN/Creat Ratio 12 - 28 - - 16  Sodium 135 - 145 mmol/L 139 139 142  Potassium 3.5 - 5.1 mmol/L 3.9 3.5 4.3  Chloride 98 - 111 mmol/L 102 101 102  CO2 22 - 32 mmol/L 25 27 24   Calcium 8.9 - 10.3 mg/dL 9.1 8.5(L) 8.9     NUTRITION - FOCUSED PHYSICAL EXAM: Findings are mild buccal /orbital fat depletion, mild temporal muscle depletion, and no edema.    Diet Order:   Diet Order            Diet Heart Room service appropriate? Yes; Fluid consistency: Thin  Diet effective now              EDUCATION NEEDS:   No education needs have been identified at this time Skin:  Skin  Assessment: Reviewed RN Assessment(dry and intact)  Last BM:  8/8  Height:   Ht Readings from Last 1 Encounters:  04/22/19 5\' 3"  (1.6 m)    Weight:   Wt Readings from Last 1 Encounters:  04/23/19 80.2 kg    Ideal Body Weight:  52 kg  BMI:  Body mass index is 31.32 kg/m.  Estimated Nutritional Needs:   Kcal:  9678-9381 (MSJ x 1.2-1.4 AF)  Protein:  78-88 gr (1.5-1.7 gr/kg/ibw)  Fluid:  1.6-1.9 liters daily   Colman Cater MS,RD,CSG,LDN Office: [phone] Pager: [phone]

## 2019-04-23 NOTE — Progress Notes (Signed)
Patient Demographics:    Dawn Nichols, is a 63 y.o. female, DOB - 01/31/1956, DPO:[id number]  Admit date - 04/22/2019   Admitting Physician Ruhani Umland Denton Brick, MD  Outpatient Primary MD for the patient is [name], DO  LOS - 1  Chief Complaint  Patient presents with   Shortness of Breath        Subjective:    Dawn Nichols today has no fevers, no emesis,  No chest pain, shortness of breath and  significant hypoxia  Persist... 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, manufacturing systems with past medical history relevant for ILD/IPF, centrilobular emphysema, mediastinal adenopathy, pulmonary embolism on chronic anticoagulation with Eliquis, chronic hypoxic respiratory failure on 6 L of oxygen at home admitted on 04/22/2019 with with worsening respiratory symptoms and significant hypoxia--required continuous BiPAP after failing high flow oxygen  A/p 1)Acute on chronic Hypoxic respiratory Failure--- due to right-sided pneumonia --required continuous BiPAP after failing high flow oxygen -May use BiPAP 4 hours on 3 hours off during the day and continuously overnight -Continue IV Rocephin and doxycycline as ordered, mucolytics bronchodilators and supplemental oxygen as ordered ---??? if patient will qualify for BiPAP or trilogy machine at home  2)Acute COPD exacerbation/underlying ILD--- COPD exacerbation secondary to pneumonia--continue IV Solu-Medrol, bronchodilators mucolytics as ordered -IV antibiotics as  above -BiPAP and supplemental oxygen as above -c/n OFEV for ILD  3)Social/Ethics--- patient is a full code  4)Chronic anticoagulation due to history of PE--- continue Eliquis  5)Anxiety disorder--- continue Prozac  6)Tobacco Abuse--nicotine patch as ordered  7)HFpEF--- last known EF 60 to 65%, patient with chronic diastolic dysfunction CHF--- currently appears euvolemic, no CHF exacerbation clinically and radiologically at this time  Disposition/Need for in-Hospital Stay- patient unable to be discharged at this time due to -required continuous BiPAP after failing high flow oxygen   Code Status : Full  Family Communication:   (patient is alert, awake and coherent) -Discussed with patient's husband who plans to visit on 04/24/2019  Disposition Plan  : TBD  Consults  :  Na  DVT Prophylaxis  : Eliquis- SCDs   Lab Results  Component Value Date   PLT 354 04/23/2019    Inpatient Medications  Scheduled Meds:  apixaban  5 mg Oral BID   Chlorhexidine Gluconate Cloth  6 each Topical Q0600   doxycycline  100 mg Oral Q12H   feeding supplement (ENSURE ENLIVE)  237 mL Oral BID BM   FLUoxetine  40 mg Oral Daily   gabapentin  600 mg Oral QID   guaiFENesin  600 mg Oral BID   methylPREDNISolone (SOLU-MEDROL) injection  40 mg Intravenous Q8H   nicotine  14 mg Transdermal Daily   Nintedanib  150 mg Oral BID   polyethylene glycol  17 g Oral Daily   sodium chloride  flush  3 mL Intravenous Q12H   umeclidinium-vilanterol  1 puff Inhalation Daily   Continuous Infusions:  sodium chloride     cefTRIAXone (ROCEPHIN)  IV Stopped (04/23/19 1238)   PRN Meds:.sodium chloride, acetaminophen **OR** acetaminophen, albuterol, ondansetron **OR** ondansetron (ZOFRAN) IV, oxyCODONE-acetaminophen, sodium chloride flush, traZODone    Anti-infectives (From admission, onward)   Start     Dose/Rate Route Frequency Ordered Stop   04/22/19 1415  cefTRIAXone (ROCEPHIN) 1 g in sodium  chloride 0.9 % 100 mL IVPB  Status:  Discontinued     1 g 200 mL/hr over 30 Minutes Intravenous Every 24 hours 04/22/19 1413 04/22/19 1417   04/22/19 1415  doxycycline (VIBRA-TABS) tablet 100 mg     100 mg Oral Every 12 hours 04/22/19 1413     04/22/19 1230  cefTRIAXone (ROCEPHIN) 1 g in sodium chloride 0.9 % 100 mL IVPB     1 g 200 mL/hr over 30 Minutes Intravenous Every 24 hours 04/22/19 1225     04/22/19 1230  azithromycin (ZITHROMAX) 500 mg in sodium chloride 0.9 % 250 mL IVPB  Status:  Discontinued     500 mg 250 mL/hr over 60 Minutes Intravenous Every 24 hours 04/22/19 1225 04/22/19 1703        Objective:   Vitals:   04/23/19 1300 04/23/19 1400 04/23/19 1412 04/23/19 1500  BP: 125/86 (!) 143/94  119/86  Pulse: 90 92 94 89  Resp: (!) 26 (!) 23 (!) 32 (!) 33  Temp:      TempSrc:      SpO2: 92% 92% 92% 93%  Weight:      Height:        Wt Readings from Last 3 Encounters:  04/23/19 80.2 kg  04/08/19 83.9 kg  11/12/18 83.5 kg     Intake/Output Summary (Last 24 hours) at 04/23/2019 1601 Last data filed at 04/23/2019 1600 Gross per 24 hour  Intake 693.11 ml  Output --  Net 693.11 ml     Physical Exam  Gen:- Awake Alert, conversational dyspnea, increased work of breathing when she comes off BiPAP HEENT:- [place].AT, No sclera icterus Nose/Mouth--- BiPAP mask Neck-Supple Neck,No JVD,.  Lungs-diminished, Velcro type rales bilaterally, right-sided rhonchi  CV- S1, S2 normal, regular  Abd-  +ve B.Sounds, Abd Soft, No tenderness,    Extremity/Skin:- No  edema, pedal pulses present  Psych-affect is appropriate, oriented x3 Neuro-no new focal deficits, no tremors   Data Review:   Micro Results Recent Results (from the past 240 hour(s))  SARS Coronavirus 2 [facility] order, Performed in [facility] hospital lab) Nasopharyngeal Nasopharyngeal Swab     Status: None   Collection Time: 04/22/19 11:46 AM   Specimen: Nasopharyngeal Swab  Result Value Ref Range Status   SARS  Coronavirus 2 NEGATIVE NEGATIVE Final    Comment: (NOTE) If result is NEGATIVE SARS-CoV-2 target nucleic acids are NOT DETECTED. The SARS-CoV-2 RNA is generally detectable in upper and lower  respiratory specimens during the acute phase of infection. The lowest  concentration of SARS-CoV-2 viral copies this assay can detect is 250  copies / mL. A negative result does not preclude SARS-CoV-2 infection  and should not be used as the sole basis for treatment or other  patient management decisions.  A negative result may occur with  improper specimen collection / handling, submission of specimen other  than nasopharyngeal swab, presence of viral mutation(s) within the  areas targeted by this assay, and inadequate number of viral copies  (<250 copies /  mL). A negative result must be combined with clinical  observations, patient history, and epidemiological information. If result is POSITIVE SARS-CoV-2 target nucleic acids are DETECTED. The SARS-CoV-2 RNA is generally detectable in upper and lower  respiratory specimens dur ing the acute phase of infection.  Positive  results are indicative of active infection with SARS-CoV-2.  Clinical  correlation with patient history and other diagnostic information is  necessary to determine patient infection status.  Positive results do  not rule out bacterial infection or co-infection with other viruses. If result is PRESUMPTIVE POSTIVE SARS-CoV-2 nucleic acids MAY BE PRESENT.   A presumptive positive result was obtained on the submitted specimen  and confirmed on repeat testing.  While 2019 novel coronavirus  (SARS-CoV-2) nucleic acids may be present in the submitted sample  additional confirmatory testing may be necessary for epidemiological  and / or clinical management purposes  to differentiate between  SARS-CoV-2 and other Sarbecovirus currently known to infect humans.  If clinically indicated additional testing with an alternate test   methodology [id number]) is advised. The SARS-CoV-2 RNA is generally  detectable in upper and lower respiratory sp ecimens during the acute  phase of infection. The expected result is Negative. Fact Sheet for Patients:  StrictlyIdeas.no Fact Sheet for Healthcare Providers: BankingDealers.co.za This test is not yet approved or cleared by the [place] FDA and has been authorized for detection and/or diagnosis of SARS-CoV-2 by FDA under an Emergency Use Authorization (EUA).  This EUA will remain in effect (meaning this test can be used) for the duration of the COVID-19 declaration under Section 564(b)(1) of the Act, 21 U.S.C. section 360bbb-3(b)(1), unless the authorization is terminated or revoked sooner. Performed at [facility], [address], [place], [place] [address]   MRSA PCR Screening     Status: None   Collection Time: 04/22/19  6:12 PM   Specimen: Nasopharyngeal  Result Value Ref Range Status   MRSA by PCR NEGATIVE NEGATIVE Final    Comment:        The GeneXpert MRSA Assay (FDA approved for NASAL specimens only), is one component of a comprehensive MRSA colonization surveillance program. It is not intended to diagnose MRSA infection nor to guide or monitor treatment for MRSA infections. Performed at [facility], [address] [place], [place] [address]     Radiology Reports Dg Chest Portable 1 View  Result Date: 04/22/2019 CLINICAL DATA:  Shortness of breath EXAM: PORTABLE CHEST 1 VIEW COMPARISON:  04/14/2019 FINDINGS: Cardiac silhouette is stable, appearing mildly enlarged. Diffuse chronic interstitial lung changes, with increasing interstitial opacity. More focal airspace consolidation in the right lower lobe. No pneumothorax. IMPRESSION: 1. Right lower lobe airspace consolidation suspicious for pneumonia in the appropriate clinical setting. 2. Diffuse chronic interstitial changes with suggestion of superimposed  interstitial edema. Electronically Signed   By: Davina Poke M.D.   On: 04/22/2019 11:33     CBC Recent Labs  Lab 04/22/19 1229 04/23/19 0630  WBC 11.6* 12.1*  HGB 13.4 13.4  HCT 42.1 41.9  PLT 326 354  MCV 92.3 91.7  MCH 29.4 29.3  MCHC 31.8 32.0  RDW 12.6 12.4  LYMPHSABS 1.7  --   MONOABS 0.5  --   EOSABS 0.2  --   BASOSABS 0.0  --     Chemistries  Recent Labs  Lab 04/22/19 1229 04/23/19 0630  NA 139 139  K 3.5 3.9  CL 101 102  CO2 27 25  GLUCOSE 91 132*  BUN 18 18  CREATININE 0.72 0.50  CALCIUM 8.5* 9.1  AST 22  --   ALT 18  --   ALKPHOS 28*  --   BILITOT 1.1  --    ------------------------------------------------------------------------------------------------------------------ No results for input(s): CHOL, HDL, LDLCALC, TRIG, CHOLHDL, LDLDIRECT in the last 72 hours.  No results found for: HGBA1C ------------------------------------------------------------------------------------------------------------------ No results for input(s): TSH, T4TOTAL, T3FREE, THYROIDAB in the last 72 hours.  Invalid input(s): FREET3 ------------------------------------------------------------------------------------------------------------------ No results for input(s): VITAMINB12, FOLATE, FERRITIN, TIBC, IRON, RETICCTPCT in the last 72 hours.  Coagulation profile No results for input(s): INR, PROTIME in the last 168 hours.  No results for input(s): DDIMER in the last 72 hours.  Cardiac Enzymes No results for input(s): CKMB, TROPONINI, MYOGLOBIN in the last 168 hours.  Invalid input(s): CK ------------------------------------------------------------------------------------------------------------------    Component Value Date/Time   BNP 31.0 04/22/2019 1229   Dawn Nichols M.D on 04/23/2019 at 4:01 PM  Go to [email or web address] - for contact info  [facility] - Office  [phone]

## 2019-04-24 ENCOUNTER — Telehealth: Payer: Self-pay | Admitting: Internal Medicine

## 2019-04-24 ENCOUNTER — Ambulatory Visit: Payer: 59 | Admitting: Internal Medicine

## 2019-04-24 DIAGNOSIS — Z7189 Other specified counseling: Secondary | ICD-10-CM

## 2019-04-24 DIAGNOSIS — Z515 Encounter for palliative care: Secondary | ICD-10-CM

## 2019-04-24 DIAGNOSIS — R0689 Other abnormalities of breathing: Secondary | ICD-10-CM

## 2019-04-24 DIAGNOSIS — J8410 Pulmonary fibrosis, unspecified: Secondary | ICD-10-CM

## 2019-04-24 DIAGNOSIS — R0600 Dyspnea, unspecified: Secondary | ICD-10-CM

## 2019-04-24 DIAGNOSIS — J181 Lobar pneumonia, unspecified organism: Secondary | ICD-10-CM

## 2019-04-24 MED ORDER — SALINE SPRAY 0.65 % NA SOLN
1.0000 | NASAL | Status: DC | PRN
  Administered 2019-04-24: 1 via NASAL
  Filled 2019-04-24: qty 44

## 2019-04-24 MED ORDER — METHYLPREDNISOLONE SODIUM SUCC 125 MG IJ SOLR
80.0000 mg | Freq: Four times a day (QID) | INTRAMUSCULAR | Status: DC
  Administered 2019-04-24 – 2019-04-27 (×12): 80 mg via INTRAVENOUS
  Filled 2019-04-24 (×12): qty 2

## 2019-04-24 NOTE — Consult Note (Signed)
Consultation Note Date: 04/24/2019   Patient Name: Dawn Nichols  DOB: [birth year]  MRN: [id number]  Age / Sex: 63 y.o., female  PCP: [name], DO Referring Physician: Roxan Hockey, MD  Reason for Consultation: Establishing goals of care  HPI/Patient Profile: 63 y.o. female  with past medical history of ILD/IPF on 6L home oxygen, centrilobular emphysema, PE on Eliquis admitted on 04/22/2019 with worsening respiratory symptoms and hypoxia. Followed by [name] for ILD/IPF. Recent visit on 7/28 and CT chest with worsening fibrosis. Patient started on antibiotics, steroids, bronchodilators, and mucolytics with possible right-sided pneumonia. Patient requiring 15L HFNC and BiPAP use. If this is IPF flare-up, [name] suspects very poor prognosis. Palliative medicine consultation for goals of care.   Clinical Assessment and Goals of Care:  I have reviewed medical records, discussed with Dr. Denton Brick and joined him at bedside with patient and husband [name]) to discuss [place].   Dr. Denton Brick discussed course of hospitalization including diagnoses, interventions, and plan of care including watchful waiting through the weekend. If pneumonia, anticipate some improvement in clinical status. If IPF flare-up, suspect patient may not respond to aggressive medical interventions and with poor prognosis.  After Dr. Denton Brick finished discussing with family, I stayed at bedside to further explain role of palliative medicine.   I introduced Palliative Medicine as specialized medical care for people living with serious illness. It focuses on providing relief from the symptoms and stress of a serious illness. The goal is to improve quality of life for both the patient and the family.  We discussed a brief life review of the patient. Lives home with husband. They have two sons and three grandchildren. She has been  unable to work since 2013 back surgery for scoliosis. Prior to this, she worked in [employer]. She had a hospitalization in November 2019, which was when Kaylena was diagnosed with pulmonary fibrosis.   She is followed by [name]. Her and husband have a good understanding that this is an irreversible condition, and eventually it would flare-up and debilitate her. Prior to admit, struggling to ambulate from bed to door without shortness of breath and desaturation on 6L [place]. Pasha is not surprised by the information given to her by Dr. Denton Brick, but she did not see it coming so soon.   I attempted to elicit values and goals of care important to the patient and husband. They appear overwhelmed with information today. She does not have a documented living will or advance directive and has not thought of her wishes.    Davinia and [name] understand they are faced with big decisions. Explained 'hoping for the best' (if this is pneumonia, she may show slight improvement) but also 'preparing for the worst' that if this is truly a IPF flare-up, time is short. Encouraged them to begin discussions of her wishes if the worst of the worst happened--her heart stopped and/or she was working harder to breath despite BiPAP and HFNC. Explained Dr. Golden Pop recommendation for DNR/DNI with her underlying chronic disease, with  fear that these interventions will cause more harm and suffering at EOL. Encouraged them to consider where she would wish to be knowing time is short, here at the hospital on life support/aggressive medical interventions vs. Home with family, comfortable at EOL.   Kade and [name] are definitely not ready to make decisions today but appreciative of the information.   Questions and concerns were addressed.  Hard Choices booklet left for review. Introduced MOST form. PMT contact information given.     SUMMARY OF RECOMMENDATIONS    Continue FULL code/FULL scope treatment  Initial palliative discussion  along with Dr. Denton Brick. Watchful waiting through the weekend. If patient does not show improvement, likely this is due to IPF flare-up and suspect poor long-term prognosis.  Encouraged patient/husband to discuss GOC/EOL wishes regarding heroic medical interventions. Recommended DNR/DNI with underlying irreversible condition.   PMT provider not at [facility] again until Monday, August 17th but will f/u if patient remains hospitalized.   Code Status/Advance Care Planning:  Full code  Symptom Management:   Per attending  Palliative Prophylaxis:   Aspiration, Delirium Protocol and Oral Care  Psycho-social/Spiritual:   Desire for further Chaplaincy support: yes  Additional Recommendations: Caregiving  Support/Resources, Compassionate Wean Education and Education on Hospice  Prognosis:   Guarded to poor  Discharge Planning: To Be Determined      Primary Diagnoses: Present on Admission: . Centrilobular emphysema ([place]) . Tobacco abuse . 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 H/o Pulmonary embolism - currently on Eliquis . Chronic heart failure with preserved ejection fraction (HFpEF)/dCHF -EF 60 to 65%   I have reviewed the medical record, interviewed the patient and family, and examined the patient. The following aspects are pertinent.  Past Medical History:  Diagnosis Date  . Arthritis   . CHF (congestive heart failure) ([place])   . Emphysema of lung ([place])   . Hyperlipidemia   . NSTEMI (non-ST elevated myocardial infarction) ([place]) 06/25/2018  . Pulmonary fibrosis ([place])   . Tobacco use    Social History   Socioeconomic History  . Marital status: Married    Spouse name: Not on file  . Number of children: 2  . Years of education: Not on file  . Highest education level: 12th grade  Occupational History  . Occupation: Disabled  Social Needs  . Financial resource strain: Not very hard  . Food insecurity    Worry: Never true     Inability: Never true  . Transportation needs    Medical: No    Non-medical: No  Tobacco Use  . Smoking status: Former Smoker    Packs/day: 0.50    Years: 45.00    Pack years: 22.50    Types: Cigarettes    Quit date: 08/13/2018    Years since quitting: 0.6  . Smokeless tobacco: Never Used  . Tobacco comment: 4-5 cigarettes per day 11.26.19  Substance and Sexual Activity  . Alcohol use: Not Currently  . Drug use: Never  . Sexual activity: Not Currently  Lifestyle  . Physical activity    Days per week: 0 days    Minutes per session: 0 min  . Stress: Not at all  Relationships  . Social connections    Talks on phone: More than three times a week    Gets together: Twice a week    Attends religious service: 1 to 4 times per year    Active member of club or organization: No  Attends meetings of clubs or organizations: Never    Relationship status: Married  Other Topics Concern  . Not on file  Social History Narrative  . Not on file   Family History  Problem Relation Age of Onset  . Cancer Mother   . Cancer Father        Brain tumor  . Diabetes Sister   . Clotting disorder Brother   . Heart disease Maternal Aunt    Scheduled Meds: . apixaban  5 mg Oral BID  . Chlorhexidine Gluconate Cloth  6 each Topical Q0600  . doxycycline  100 mg Oral Q12H  . feeding supplement (ENSURE ENLIVE)  237 mL Oral BID BM  . FLUoxetine  40 mg Oral Daily  . gabapentin  600 mg Oral QID  . guaiFENesin  600 mg Oral BID  . methylPREDNISolone (SOLU-MEDROL) injection  80 mg Intravenous Q6H  . multivitamin with minerals  1 tablet Oral QHS  . nicotine  14 mg Transdermal Daily  . Nintedanib  150 mg Oral BID  . polyethylene glycol  17 g Oral Daily  . sodium chloride flush  3 mL Intravenous Q12H  . umeclidinium-vilanterol  1 puff Inhalation Daily   Continuous Infusions: . sodium chloride    . cefTRIAXone (ROCEPHIN)  IV 1 g (04/24/19 1136)   PRN Meds:.sodium chloride, acetaminophen **OR**  acetaminophen, albuterol, ondansetron **OR** ondansetron (ZOFRAN) IV, oxyCODONE-acetaminophen, sodium chloride, sodium chloride flush, traZODone Medications Prior to Admission:  Prior to Admission medications   Medication Sig Start Date End Date Taking? Authorizing Provider  albuterol (PROVENTIL HFA;VENTOLIN HFA) 108 (90 Base) MCG/ACT inhaler Inhale 2 puffs into the lungs every 6 (six) hours as needed for wheezing or shortness of breath. 06/28/18  Yes Pokhrel, Laxman, MD  ANORO ELLIPTA 62.5-25 MCG/INH AEPB TAKE 1 PUFF BY MOUTH EVERY DAY Patient taking differently: Inhale 1 puff into the lungs daily.  10/31/18  Yes Lauraine Rinne, NP  apixaban (ELIQUIS) 5 MG TABS tablet Take 1 tablet (5 mg total) by mouth 2 (two) times daily. 01/31/19  Yes Gottschalk, Ashly M, DO  FLUoxetine (PROZAC) 40 MG capsule Take 1 capsule (40 mg total) by mouth daily. 12/16/18  Yes Gottschalk, Leatrice Jewels M, DO  gabapentin (NEURONTIN) 300 MG capsule Take 300-600 mg by mouth 4 (four) times daily.  12/07/13  Yes [provider]  Nintedanib (OFEV) 150 MG CAPS Take 1 capsule (150 mg total) by mouth 2 (two) times daily. 12/03/18  Yes Brand Males, MD  oxyCODONE-acetaminophen (PERCOCET) 10-325 MG per tablet Take 1 tablet by mouth every 6 (six) hours as needed for pain.  12/12/13  Yes [provider]  nystatin (MYCOSTATIN/NYSTOP) powder Apply topically 4 (four) times daily. Patient taking differently: Apply topically 4 (four) times daily as needed (for irritation).  11/05/18   Sharion Balloon, FNP  nystatin-triamcinolone ointment (MYCOLOG) Apply 1 application topically 2 (two) times daily. Patient taking differently: Apply 1 application topically 2 (two) times daily as needed (for irritation).  11/05/18   Sharion Balloon, FNP   No Known Allergies Review of Systems  Constitutional: Positive for activity change and fatigue.  Respiratory: Positive for shortness of breath.   Neurological: Positive for weakness.    Physical  Exam Vitals signs and nursing note reviewed.  Constitutional:      General: She is awake.     Appearance: She is ill-appearing.  HENT:     Head: Normocephalic and atraumatic.  Cardiovascular:     Rate and Rhythm: Normal rate.  Pulmonary:     Comments: Dyspnea at rest. Intermittently on 15L HFNC and BiPAP satting high 80's, low 90's. Neurological:     Mental Status: She is alert and oriented to person, place, and time.  Psychiatric:        Mood and Affect: Mood normal.        Speech: Speech normal.        Cognition and Memory: Cognition normal.     Comments: tearful    Vital Signs: BP 124/80   Pulse 100   Temp 97.9 F (36.6 C) (Axillary)   Resp (!) 22   Ht 5\' 3"  (1.6 m)   Wt 80.5 kg   SpO2 (!) 88%   BMI 31.44 kg/m  Pain Scale: 0-10 POSS *See Group Information*: 1-Acceptable,Awake and alert Pain Score: 5    SpO2: SpO2: (!) 88 % O2 Device:SpO2: (!) 88 % O2 Flow Rate: .O2 Flow Rate (L/min): 25 L/min  IO: Intake/output summary:   Intake/Output Summary (Last 24 hours) at 04/24/2019 1448 Last data filed at 04/24/2019 5701 Gross per 24 hour  Intake 103 ml  Output -  Net 103 ml    LBM: Last BM Date: 04/19/19 Baseline Weight: Weight: 83.9 kg Most recent weight: Weight: 80.5 kg     Palliative Assessment/Data: PPS 40%   Flowsheet Rows     Most Recent Value  Intake Tab  Referral Department  Hospitalist  Unit at Time of Referral  ICU  Palliative Care Primary Diagnosis  Pulmonary  Palliative Care Type  New Palliative care  Reason for referral  Clarify Goals of Care  Date first seen by Palliative Care  04/24/19  Clinical Assessment  Palliative Performance Scale Score  40%  Psychosocial & Spiritual Assessment  Palliative Care Outcomes  Patient/Family meeting held?  Yes  Who was at the meeting?  patient and husband  Palliative Care Outcomes  Clarified goals of care, Counseled regarding hospice, Provided end of life care assistance, Provided psychosocial or spiritual  support, ACP counseling assistance      Time In: 7793 Time Out: 9030 Time Total: 70 Greater than 50%  of this time was spent counseling and coordinating care related to the above assessment and plan.  Signed by:  Ihor Dow, DNP, FNP-C Palliative Medicine Team  Phone: [phone] Fax: [phone]   Please contact Palliative Medicine Team phone at [phone] for questions and concerns.  For individual provider: See Shea Evans

## 2019-04-24 NOTE — Telephone Encounter (Signed)
     Call from Dr Roxan Hockey - [facility] MD - at [facility]  Case reviewed  04/08/2019 - IPF visit - she was declining significantly  - needing 6L [place] to covedr 1 lap. Had CT chest few days ago and this showed worsening fibrosis. She got admitted 04/22/2019 to [facility] and is requireing 15L [place] and BiPAP.   D.w Dr Denton Brick   - look for reversible etiologies or any etiology - trop, pct, rvp , etc.,  - sounds like she is having IPF flare up - if flare up bad prognosis - 90% - 3 month mortality and 50% inpatient mortality with median survival 3-13 days  -  Can do steroids assuming not septic  - can give trial lawsix  - Advise DNR/DNI and if not turning around in several days - total palliation recommended     SIGNATURE    Dr. Brand Males, M.D., F.C.C.P,  Pulmonary and Critical Care Medicine Staff Physician, [place] Director - Interstitial Lung Disease  Program  [facility] [place], [place], [address]  Pager: [phone], If no answer or between  15:00h - 7:00h: call [phone] Telephone: [phone]  1:54 PM 04/24/2019

## 2019-04-24 NOTE — Progress Notes (Signed)
Pt returned to BIPAP per Dr. Joesph Fillers due to WOB and low O2 saturation

## 2019-04-24 NOTE — Progress Notes (Signed)
Patient Demographics:    Dawn Nichols, is a 63 y.o. female, DOB - 02/27/1956, [id number]  Admit date - 04/22/2019   Admitting Physician Ellarie Picking Denton Brick, MD  Outpatient Primary MD for the patient is Dawn Norlander, DO  LOS - 2  Chief Complaint  Patient presents with  . Shortness of Breath        Subjective:    Dawn Nichols today has no fevers, no emesis,  No chest pain, \  Husband at bedside, patient continues to struggle from a respiratory standpoint   Assessment  & Plan :    Principal Problem:   Community acquired pneumonia of right lower lobe of lung ([place]) Active Problems:   Centrilobular emphysema (HCC)   Tobacco abuse   Chronic respiratory failure with hypoxia due to COPD/ILD-- on 6L/min at Home   H/o Pulmonary embolism - currently on Eliquis   Goals of care, counseling/discussion   Chronic heart failure with preserved ejection fraction (HFpEF)/dCHF -EF 60 to 65%   Palliative care by specialist   Interstitial pulmonary fibrosis (HCC)   Dyspnea and respiratory abnormalities  Brief Summary  63 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, manufacturing systems with past medical history relevant for ILD/IPF, centrilobular emphysema, mediastinal adenopathy, pulmonary embolism on chronic anticoagulation with Eliquis, chronic hypoxic respiratory failure on 6 L of oxygen at home admitted on 04/22/2019 with with worsening respiratory symptoms and significant hypoxia--required continuous BiPAP after failing high flow oxygen  A/p 1)Acute on chronic Hypoxic respiratory Failure--- due to right-sided pneumonia --Continues to require  BiPAP for prolonged periods of time after failing high flow oxygen -May use BiPAP 4 hours on 3 hours off during the day and continuously overnight -Continue IV Rocephin and doxycycline as ordered, mucolytics bronchodilators and supplemental oxygen as ordered  2)Acute COPD exacerbation/underlying ILD---  COPD exacerbation secondary to pneumonia--continue IV Solu-Medrol, bronchodilators mucolytics as ordered -IV antibiotics as above -BiPAP and supplemental oxygen as above -c/n OFEV for ILD --- Discussed with [name] patient's primary pulmonologist--- if this is a true IPF flareup then mortality is very very high in the short-term  3)Social/Ethics---  plan of care and CODE STATUS discussed with patient and husband at bedside palliative care consult appreciated.  Patient is a full code  4)Chronic anticoagulation due to history of PE--- continue Eliquis  5)Anxiety disorder--- continue Prozac  6)Tobacco Abuse--nicotine patch as ordered  7)HFpEF--- last known EF 60 to 65%, patient with chronic diastolic dysfunction CHF--- currently appears euvolemic, no CHF exacerbation clinically and radiologically at this time  Disposition/Need for in-Hospital Stay- patient unable to be discharged at this time due to -required continuous BiPAP after failing high flow oxygen   Code Status : Full  Family Communication:   (patient is alert, awake and coherent) -Discussed with patient's husband on 04/24/2019 at bedside  Disposition Plan  : TBD  Consults  : Phone consult with patient's pulmonologist [name] who is an IPF specialist  DVT Prophylaxis  : Eliquis- SCDs   Lab Results  Component Value Date   PLT 354 04/23/2019    Inpatient Medications  Scheduled Meds: . apixaban  5 mg Oral BID  . Chlorhexidine Gluconate Cloth  6 each Topical Q0600  . doxycycline  100 mg Oral Q12H  . feeding supplement (ENSURE  ENLIVE)  237 mL Oral BID BM  . FLUoxetine  40 mg Oral Daily  . gabapentin  600 mg Oral QID  . guaiFENesin  600 mg Oral BID  . methylPREDNISolone (SOLU-MEDROL) injection  80 mg Intravenous Q6H  . multivitamin with minerals  1 tablet Oral QHS  . nicotine  14 mg Transdermal Daily  . Nintedanib  150 mg Oral BID  . polyethylene glycol  17 g Oral Daily  . sodium chloride flush  3 mL  Intravenous Q12H  . umeclidinium-vilanterol  1 puff Inhalation Daily   Continuous Infusions: . sodium chloride    . cefTRIAXone (ROCEPHIN)  IV 1 g (04/24/19 1136)   PRN Meds:.sodium chloride, acetaminophen **OR** acetaminophen, albuterol, ondansetron **OR** ondansetron (ZOFRAN) IV, oxyCODONE-acetaminophen, sodium chloride, sodium chloride flush, traZODone    Anti-infectives (From admission, onward)   Start     Dose/Rate Route Frequency Ordered Stop   04/22/19 1415  cefTRIAXone (ROCEPHIN) 1 g in sodium chloride 0.9 % 100 mL IVPB  Status:  Discontinued     1 g 200 mL/hr over 30 Minutes Intravenous Every 24 hours 04/22/19 1413 04/22/19 1417   04/22/19 1415  doxycycline (VIBRA-TABS) tablet 100 mg     100 mg Oral Every 12 hours 04/22/19 1413     04/22/19 1230  cefTRIAXone (ROCEPHIN) 1 g in sodium chloride 0.9 % 100 mL IVPB     1 g 200 mL/hr over 30 Minutes Intravenous Every 24 hours 04/22/19 1225     04/22/19 1230  azithromycin (ZITHROMAX) 500 mg in sodium chloride 0.9 % 250 mL IVPB  Status:  Discontinued     500 mg 250 mL/hr over 60 Minutes Intravenous Every 24 hours 04/22/19 1225 04/22/19 1703        Objective:   Vitals:   04/24/19 1500 04/24/19 1600 04/24/19 1700 04/24/19 1800  BP:      Pulse: 97 90 98 98  Resp: (!) 22 17 19 20   Temp:      TempSrc:      SpO2: (!) 86% 90% (!) 88% (!) 89%  Weight:      Height:        Wt Readings from Last 3 Encounters:  04/24/19 80.5 kg  04/08/19 83.9 kg  11/12/18 83.5 kg     Intake/Output Summary (Last 24 hours) at 04/24/2019 1846 Last data filed at 04/24/2019 0272 Gross per 24 hour  Intake 3 ml  Output -  Net 3 ml     Physical Exam  Gen:- Awake Alert, conversational dyspnea, increased work of breathing when she comes off BiPAP HEENT:- .AT, No sclera icterus Nose/Mouth--- BiPAP mask Neck-Supple Neck,No JVD,.  Lungs-diminished, Velcro type rales bilaterally, right-sided rhonchi  CV- S1, S2 normal, regular  Abd-  +ve  B.Sounds, Abd Soft, No tenderness,    Extremity/Skin:- No  edema, pedal pulses present  Psych-affect is appropriate, oriented x3 Neuro-no new focal deficits, no tremors   Data Review:   Micro Results Recent Results (from the past 240 hour(s))  SARS Coronavirus 2 [facility] order, Performed in [facility] hospital lab) Nasopharyngeal Nasopharyngeal Swab     Status: None   Collection Time: 04/22/19 11:46 AM   Specimen: Nasopharyngeal Swab  Result Value Ref Range Status   SARS Coronavirus 2 NEGATIVE NEGATIVE Final    Comment: (NOTE) If result is NEGATIVE SARS-CoV-2 target nucleic acids are NOT DETECTED. The SARS-CoV-2 RNA is generally detectable in upper and lower  respiratory specimens during the acute phase of infection. The lowest  concentration  of SARS-CoV-2 viral copies this assay can detect is 250  copies / mL. A negative result does not preclude SARS-CoV-2 infection  and should not be used as the sole basis for treatment or other  patient management decisions.  A negative result may occur with  improper specimen collection / handling, submission of specimen other  than nasopharyngeal swab, presence of viral mutation(s) within the  areas targeted by this assay, and inadequate number of viral copies  (<250 copies / mL). A negative result must be combined with clinical  observations, patient history, and epidemiological information. If result is POSITIVE SARS-CoV-2 target nucleic acids are DETECTED. The SARS-CoV-2 RNA is generally detectable in upper and lower  respiratory specimens dur ing the acute phase of infection.  Positive  results are indicative of active infection with SARS-CoV-2.  Clinical  correlation with patient history and other diagnostic information is  necessary to determine patient infection status.  Positive results do  not rule out bacterial infection or co-infection with other viruses. If result is PRESUMPTIVE POSTIVE SARS-CoV-2 nucleic acids MAY BE PRESENT.    A presumptive positive result was obtained on the submitted specimen  and confirmed on repeat testing.  While 2019 novel coronavirus  (SARS-CoV-2) nucleic acids may be present in the submitted sample  additional confirmatory testing may be necessary for epidemiological  and / or clinical management purposes  to differentiate between  SARS-CoV-2 and other Sarbecovirus currently known to infect humans.  If clinically indicated additional testing with an alternate test  methodology [phone]) is advised. The SARS-CoV-2 RNA is generally  detectable in upper and lower respiratory sp ecimens during the acute  phase of infection. The expected result is Negative. Fact Sheet for Patients:  StrictlyIdeas.no Fact Sheet for Healthcare Providers: BankingDealers.co.za This test is not yet approved or cleared by the [place] FDA and has been authorized for detection and/or diagnosis of SARS-CoV-2 by FDA under an Emergency Use Authorization (EUA).  This EUA will remain in effect (meaning this test can be used) for the duration of the COVID-19 declaration under Section 564(b)(1) of the Act, 21 U.S.C. section 360bbb-3(b)(1), unless the authorization is terminated or revoked sooner. Performed at [facility], [address] [place], [place] [address]   MRSA PCR Screening     Status: None   Collection Time: 04/22/19  6:12 PM   Specimen: Nasopharyngeal  Result Value Ref Range Status   MRSA by PCR NEGATIVE NEGATIVE Final    Comment:        The GeneXpert MRSA Assay (FDA approved for NASAL specimens only), is one component of a comprehensive MRSA colonization surveillance program. It is not intended to diagnose MRSA infection nor to guide or monitor treatment for MRSA infections. Performed at [facility], [address] [place], [place] [address]     Radiology Reports Dg Chest Portable 1 View  Result Date: 04/22/2019 CLINICAL DATA:   Shortness of breath EXAM: PORTABLE CHEST 1 VIEW COMPARISON:  04/14/2019 FINDINGS: Cardiac silhouette is stable, appearing mildly enlarged. Diffuse chronic interstitial lung changes, with increasing interstitial opacity. More focal airspace consolidation in the right lower lobe. No pneumothorax. IMPRESSION: 1. Right lower lobe airspace consolidation suspicious for pneumonia in the appropriate clinical setting. 2. Diffuse chronic interstitial changes with suggestion of superimposed interstitial edema. Electronically Signed   By: Davina Poke M.D.   On: 04/22/2019 11:33     CBC Recent Labs  Lab 04/22/19 1229 04/23/19 0630  WBC 11.6* 12.1*  HGB 13.4 13.4  HCT  42.1 41.9  PLT 326 354  MCV 92.3 91.7  MCH 29.4 29.3  MCHC 31.8 32.0  RDW 12.6 12.4  LYMPHSABS 1.7  --   MONOABS 0.5  --   EOSABS 0.2  --   BASOSABS 0.0  --     Chemistries  Recent Labs  Lab 04/22/19 1229 04/23/19 0630  NA 139 139  K 3.5 3.9  CL 101 102  CO2 27 25  GLUCOSE 91 132*  BUN 18 18  CREATININE 0.72 0.50  CALCIUM 8.5* 9.1  AST 22  --   ALT 18  --   ALKPHOS 28*  --   BILITOT 1.1  --    ------------------------------------------------------------------------------------------------------------------ No results for input(s): CHOL, HDL, LDLCALC, TRIG, CHOLHDL, LDLDIRECT in the last 72 hours.  No results found for: HGBA1C ------------------------------------------------------------------------------------------------------------------ No results for input(s): TSH, T4TOTAL, T3FREE, THYROIDAB in the last 72 hours.  Invalid input(s): FREET3 ------------------------------------------------------------------------------------------------------------------ No results for input(s): VITAMINB12, FOLATE, FERRITIN, TIBC, IRON, RETICCTPCT in the last 72 hours.  Coagulation profile No results for input(s): INR, PROTIME in the last 168 hours.  No results for input(s): DDIMER in the last 72 hours.  Cardiac Enzymes  No results for input(s): CKMB, TROPONINI, MYOGLOBIN in the last 168 hours.  Invalid input(s): CK ------------------------------------------------------------------------------------------------------------------    Component Value Date/Time   BNP 31.0 04/22/2019 1229   Mackayla Mullins M.D on 04/24/2019 at 6:46 PM  Go to [email or web address] - for contact info  [facility] - Office  [phone]

## 2019-04-24 NOTE — Progress Notes (Signed)
Patient taken off BIPAP and placed on Heated HFNC 25L at 70%. Patient tolerating well at this time. BIPAP on standby.

## 2019-04-25 ENCOUNTER — Inpatient Hospital Stay (HOSPITAL_COMMUNITY): Payer: 59

## 2019-04-25 LAB — BASIC METABOLIC PANEL
Anion gap: 11 (ref 5–15)
BUN: 25 mg/dL — ABNORMAL HIGH (ref 8–23)
CO2: 24 mmol/L (ref 22–32)
Calcium: 8.8 mg/dL — ABNORMAL LOW (ref 8.9–10.3)
Chloride: 104 mmol/L (ref 98–111)
Creatinine, Ser: 0.54 mg/dL (ref 0.44–1.00)
GFR calc Af Amer: >60 mL/min (ref >60)
GFR calc non Af Amer: >60 mL/min (ref >60)
Glucose, Bld: 151 mg/dL — ABNORMAL HIGH (ref 70–99)
Potassium: 4.1 mmol/L (ref 3.5–5.1)
Sodium: 139 mmol/L (ref 135–145)

## 2019-04-25 LAB — PROCALCITONIN: Procalcitonin: <0.1 ng/mL

## 2019-04-25 NOTE — Progress Notes (Signed)
Patient Demographics:    Dawn Nichols, is a 63 y.o. female, DOB - 05-02-1956, YOV:[id number]  Admit date - 04/22/2019   Admitting Physician Aryn Kops Denton Brick, MD  Outpatient Primary MD for the patient is [name], DO  LOS - 3  Chief Complaint  Patient presents with  . Shortness of Breath        Subjective:    Dawn Nichols today has no fevers, no emesis,  No chest pain,  --Significant hypoxia significant tachypnea and increased work of breathing persist  --- Requiring BiPAP for prolonged periods   Assessment  & Plan :    Principal Problem:   Community acquired pneumonia of right lower lobe of lung ([place]) Active Problems:   Centrilobular emphysema (HCC)   Tobacco abuse   Chronic respiratory failure with hypoxia due to COPD/ILD-- on 6L/min at Home   H/o Pulmonary embolism - currently on Eliquis   Goals of care, counseling/discussion   Chronic heart failure with preserved ejection fraction (HFpEF)/dCHF -EF 60 to 65%   Palliative care by specialist   Interstitial pulmonary fibrosis (HCC)   Dyspnea and respiratory abnormalities  Brief Summary  63 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, manufacturing systems with past medical history relevant for ILD/IPF, centrilobular emphysema, mediastinal adenopathy, pulmonary embolism on chronic anticoagulation with Eliquis, chronic hypoxic respiratory failure on 6 L of oxygen at home admitted on 04/22/2019 with with worsening respiratory symptoms and significant hypoxia--required continuous BiPAP after failing high flow oxygen -   A/p 1)Acute on chronic Hypoxic respiratory Failure---  --- -Procalcitonin is less than 0.1,  -Mild leukocytosis in the setting of steroid use -Afebrile -Troponin is not elevated, BNP 31,  - chest x-ray without volume overload/failure -COVID-19 negative -right-sided pneumonia or CHF much  less likely,  IPF flareup is more likely the culprit for patient's  worsening respiratory status --Continues to require  BiPAP for prolonged periods of time alternating with  15L HHFNC with an FIO2 of 100%.  -May use BiPAP 4 hours on 3 hours off during the day and continuously overnight -Continue IV Rocephin and doxycycline as ordered, mucolytics bronchodilators and supplemental oxygen as ordered -High-dose steroids -Discussed with   2)Acute COPD exacerbation/underlying ILD--- COPD exacerbation secondary to pneumonia--continue IV Solu-Medrol, bronchodilators mucolytics as ordered -IV antibiotics as above -BiPAP and supplemental oxygen as above -c/n OFEV for ILD --- Discussed with [name] patient's primary pulmonologist--- if this is a true IPF flareup then mortality is very very high in the short-term  3)Social/Ethics---  plan of care and CODE STATUS discussed with patient and husband at bedside palliative care consult appreciated.  Patient is a full code  4)Chronic anticoagulation due to history of PE--- continue Eliquis  5)Anxiety disorder--- continue Prozac  6)Tobacco Abuse--nicotine patch as ordered  7)HFpEF--- last known EF 60 to 65%, patient with chronic diastolic dysfunction CHF--- currently appears euvolemic, no CHF exacerbation clinically and radiologically at this time  Disposition/Need for in-Hospital Stay- patient unable to be discharged at this time due to -requiring BiPAP for prolonged periods of time alternating with very high flow oxygen   Code Status : Full  Family Communication:   (patient is alert, awake and coherent) -Discussed with patient's husband on 04/24/2019 at bedside  Disposition Plan  : TBD  Consults  :  Phone consult with patient's pulmonologist [name] who is an IPF specialist  DVT Prophylaxis  : Eliquis- SCDs   Lab Results  Component Value Date   PLT 354 04/23/2019    Inpatient Medications  Scheduled Meds: . apixaban  5 mg Oral BID  . Chlorhexidine Gluconate Cloth  6 each Topical Q0600  .  doxycycline  100 mg Oral Q12H  . feeding supplement (ENSURE ENLIVE)  237 mL Oral BID BM  . FLUoxetine  40 mg Oral Daily  . gabapentin  600 mg Oral QID  . guaiFENesin  600 mg Oral BID  . methylPREDNISolone (SOLU-MEDROL) injection  80 mg Intravenous Q6H  . multivitamin with minerals  1 tablet Oral QHS  . nicotine  14 mg Transdermal Daily  . Nintedanib  150 mg Oral BID  . polyethylene glycol  17 g Oral Daily  . sodium chloride flush  3 mL Intravenous Q12H  . umeclidinium-vilanterol  1 puff Inhalation Daily   Continuous Infusions: . sodium chloride    . cefTRIAXone (ROCEPHIN)  IV 1 g (04/24/19 1136)   PRN Meds:.sodium chloride, acetaminophen **OR** acetaminophen, albuterol, ondansetron **OR** ondansetron (ZOFRAN) IV, oxyCODONE-acetaminophen, sodium chloride, sodium chloride flush, traZODone    Anti-infectives (From admission, onward)   Start     Dose/Rate Route Frequency Ordered Stop   04/22/19 1415  cefTRIAXone (ROCEPHIN) 1 g in sodium chloride 0.9 % 100 mL IVPB  Status:  Discontinued     1 g 200 mL/hr over 30 Minutes Intravenous Every 24 hours 04/22/19 1413 04/22/19 1417   04/22/19 1415  doxycycline (VIBRA-TABS) tablet 100 mg     100 mg Oral Every 12 hours 04/22/19 1413     04/22/19 1230  cefTRIAXone (ROCEPHIN) 1 g in sodium chloride 0.9 % 100 mL IVPB     1 g 200 mL/hr over 30 Minutes Intravenous Every 24 hours 04/22/19 1225     04/22/19 1230  azithromycin (ZITHROMAX) 500 mg in sodium chloride 0.9 % 250 mL IVPB  Status:  Discontinued     500 mg 250 mL/hr over 60 Minutes Intravenous Every 24 hours 04/22/19 1225 04/22/19 1703        Objective:   Vitals:   04/25/19 0809 04/25/19 0900 04/25/19 1000 04/25/19 1100  BP:  122/72 117/80 (!) 129/107  Pulse:  100 100 97  Resp:  20 (!) 21 20  Temp: 98.5 F (36.9 C)     TempSrc: Oral     SpO2:  91% 91% 90%  Weight:      Height:        Wt Readings from Last 3 Encounters:  04/25/19 80.8 kg  04/08/19 83.9 kg  11/12/18 83.5 kg      Intake/Output Summary (Last 24 hours) at 04/25/2019 1134 Last data filed at 04/24/2019 2200 Gross per 24 hour  Intake -  Output 900 ml  Net -900 ml     Physical Exam  Gen:- Awake Alert, conversational dyspnea, increased work of breathing when she comes off BiPAP HEENT:- .AT, No sclera icterus Nose/Mouth--- BiPAP mask Neck-Supple Neck,No JVD,.  Lungs-diminished, Velcro type rales bilaterally, right-sided rhonchi  CV- S1, S2 normal, regular  Abd-  +ve B.Sounds, Abd Soft, No tenderness,    Extremity/Skin:- No  edema, pedal pulses present  Psych-affect is appropriate, oriented x3 Neuro-generalized weakness, no new focal deficits, no tremors   Data Review:   Micro Results Recent Results (from the past 240 hour(s))  SARS Coronavirus 2 [facility] order, Performed in [facility] hospital lab) Nasopharyngeal  Nasopharyngeal Swab     Status: None   Collection Time: 04/22/19 11:46 AM   Specimen: Nasopharyngeal Swab  Result Value Ref Range Status   SARS Coronavirus 2 NEGATIVE NEGATIVE Final    Comment: (NOTE) If result is NEGATIVE SARS-CoV-2 target nucleic acids are NOT DETECTED. The SARS-CoV-2 RNA is generally detectable in upper and lower  respiratory specimens during the acute phase of infection. The lowest  concentration of SARS-CoV-2 viral copies this assay can detect is 250  copies / mL. A negative result does not preclude SARS-CoV-2 infection  and should not be used as the sole basis for treatment or other  patient management decisions.  A negative result may occur with  improper specimen collection / handling, submission of specimen other  than nasopharyngeal swab, presence of viral mutation(s) within the  areas targeted by this assay, and inadequate number of viral copies  (<250 copies / mL). A negative result must be combined with clinical  observations, patient history, and epidemiological information. If result is POSITIVE SARS-CoV-2 target nucleic acids are DETECTED.  The SARS-CoV-2 RNA is generally detectable in upper and lower  respiratory specimens dur ing the acute phase of infection.  Positive  results are indicative of active infection with SARS-CoV-2.  Clinical  correlation with patient history and other diagnostic information is  necessary to determine patient infection status.  Positive results do  not rule out bacterial infection or co-infection with other viruses. If result is PRESUMPTIVE POSTIVE SARS-CoV-2 nucleic acids MAY BE PRESENT.   A presumptive positive result was obtained on the submitted specimen  and confirmed on repeat testing.  While 2019 novel coronavirus  (SARS-CoV-2) nucleic acids may be present in the submitted sample  additional confirmatory testing may be necessary for epidemiological  and / or clinical management purposes  to differentiate between  SARS-CoV-2 and other Sarbecovirus currently known to infect humans.  If clinically indicated additional testing with an alternate test  methodology [phone]) is advised. The SARS-CoV-2 RNA is generally  detectable in upper and lower respiratory sp ecimens during the acute  phase of infection. The expected result is Negative. Fact Sheet for Patients:  StrictlyIdeas.no Fact Sheet for Healthcare Providers: BankingDealers.co.za This test is not yet approved or cleared by the [place] FDA and has been authorized for detection and/or diagnosis of SARS-CoV-2 by FDA under an Emergency Use Authorization (EUA).  This EUA will remain in effect (meaning this test can be used) for the duration of the COVID-19 declaration under Section 564(b)(1) of the Act, 21 U.S.C. section 360bbb-3(b)(1), unless the authorization is terminated or revoked sooner. Performed at [facility], [address] [place], [place] [address]   MRSA PCR Screening     Status: None   Collection Time: 04/22/19  6:12 PM   Specimen: Nasopharyngeal  Result Value Ref  Range Status   MRSA by PCR NEGATIVE NEGATIVE Final    Comment:        The GeneXpert MRSA Assay (FDA approved for NASAL specimens only), is one component of a comprehensive MRSA colonization surveillance program. It is not intended to diagnose MRSA infection nor to guide or monitor treatment for MRSA infections. Performed at [facility] [address], [place], [place] [address]     Radiology Reports Dg Chest [place] 1 View  Result Date: 04/25/2019 CLINICAL DATA:  Patient with trouble breathing for several days. EXAM: PORTABLE CHEST 1 VIEW COMPARISON:  April 22, 2019 FINDINGS: The patient has chronic interstitial lung disease. More focal opacity is  seen in the right base than elsewhere which is new since April 14, 2019. No other interval changes. IMPRESSION: 1. Findings are chronic interstitial lung disease. 2. Persistent infiltrate in the right base worrisome for pneumonia or aspiration. Recommend follow-up to resolution. Electronically Signed   By: Dorise Bullion III M.D   On: 04/25/2019 09:37   Dg Chest Portable 1 View  Result Date: 04/22/2019 CLINICAL DATA:  Shortness of breath EXAM: PORTABLE CHEST 1 VIEW COMPARISON:  04/14/2019 FINDINGS: Cardiac silhouette is stable, appearing mildly enlarged. Diffuse chronic interstitial lung changes, with increasing interstitial opacity. More focal airspace consolidation in the right lower lobe. No pneumothorax. IMPRESSION: 1. Right lower lobe airspace consolidation suspicious for pneumonia in the appropriate clinical setting. 2. Diffuse chronic interstitial changes with suggestion of superimposed interstitial edema. Electronically Signed   By: Davina Poke M.D.   On: 04/22/2019 11:33     CBC Recent Labs  Lab 04/22/19 1229 04/23/19 0630  WBC 11.6* 12.1*  HGB 13.4 13.4  HCT 42.1 41.9  PLT 326 354  MCV 92.3 91.7  MCH 29.4 29.3  MCHC 31.8 32.0  RDW 12.6 12.4  LYMPHSABS 1.7  --   MONOABS 0.5  --   EOSABS 0.2  --   BASOSABS 0.0  --      Chemistries  Recent Labs  Lab 04/22/19 1229 04/23/19 0630 04/25/19 0514  NA 139 139 139  K 3.5 3.9 4.1  CL 101 102 104  CO2 27 25 24   GLUCOSE 91 132* 151*  BUN 18 18 25*  CREATININE 0.72 0.50 0.54  CALCIUM 8.5* 9.1 8.8*  AST 22  --   --   ALT 18  --   --   ALKPHOS 28*  --   --   BILITOT 1.1  --   --    ------------------------------------------------------------------------------------------------------------------ No results for input(s): CHOL, HDL, LDLCALC, TRIG, CHOLHDL, LDLDIRECT in the last 72 hours.  No results found for: HGBA1C ------------------------------------------------------------------------------------------------------------------ No results for input(s): TSH, T4TOTAL, T3FREE, THYROIDAB in the last 72 hours.  Invalid input(s): FREET3 ------------------------------------------------------------------------------------------------------------------ No results for input(s): VITAMINB12, FOLATE, FERRITIN, TIBC, IRON, RETICCTPCT in the last 72 hours.  Coagulation profile No results for input(s): INR, PROTIME in the last 168 hours.  No results for input(s): DDIMER in the last 72 hours.  Cardiac Enzymes No results for input(s): CKMB, TROPONINI, MYOGLOBIN in the last 168 hours.  Invalid input(s): CK ------------------------------------------------------------------------------------------------------------------    Component Value Date/Time   BNP 31.0 04/22/2019 1229   Teagen Bucio M.D on 04/25/2019 at 11:34 AM  Go to [email or web address] - for contact info  [facility] - Office  [phone]

## 2019-04-25 NOTE — Care Management Important Message (Signed)
Important Message  Patient Details  Name: Dawn Nichols MRN: [id number] Date of Birth: December 02, 1955   Medicare Important Message Given:  Yes     Ihor Gully, LCSW 04/25/2019, 3:01 PM

## 2019-04-25 NOTE — Progress Notes (Signed)
Patient taken off of BIPAP and placed on 15L HHFNC with an FIO2 of 100%. Will titrate FIO2 as tolerated.

## 2019-04-26 LAB — PROCALCITONIN: Procalcitonin: <0.1 ng/mL

## 2019-04-26 MED ORDER — LOPERAMIDE HCL 2 MG PO CAPS
2.0000 mg | ORAL_CAPSULE | Freq: Once | ORAL | Status: AC
  Administered 2019-04-26: 2 mg via ORAL
  Filled 2019-04-26: qty 1

## 2019-04-26 MED ORDER — LOPERAMIDE HCL 2 MG PO CAPS
2.0000 mg | ORAL_CAPSULE | Freq: Four times a day (QID) | ORAL | Status: DC | PRN
  Administered 2019-04-28 – 2019-04-29 (×2): 2 mg via ORAL
  Filled 2019-04-26 (×2): qty 1

## 2019-04-26 NOTE — Progress Notes (Signed)
Patient Demographics:    Dawn Nichols, is a 63 y.o. female, DOB - 1956/07/15, NWG:[id number]  Admit date - 04/22/2019   Admitting Physician Latonyia Lopata Denton Brick, MD  Outpatient Primary MD for the patient is [name], DO  LOS - 4  Chief Complaint  Patient presents with   Shortness of Breath        Subjective:    Dawn Nichols today has no fevers, no emesis,  No chest pain,   Patient tried to use a bedpan while on 15 L high flow oxygen but she desaturated down to the low 80s--- had increased work of breathing and tachypnea   Assessment  & Plan :    Principal Problem:   Community acquired pneumonia of right lower lobe of lung ([place]) Active Problems:   Centrilobular emphysema ([place])   Tobacco abuse   Chronic respiratory failure with hypoxia due to COPD/ILD-- on 6L/min at Home   H/o Pulmonary embolism - currently on Eliquis   Goals of care, counseling/discussion   Chronic heart failure with preserved ejection fraction (HFpEF)/dCHF -EF 60 to 65%   Palliative care by specialist   Interstitial pulmonary fibrosis ([place])   Dyspnea and respiratory abnormalities  Brief Summary  63 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, manufacturing systems with past medical history relevant for ILD/IPF, centrilobular emphysema, mediastinal adenopathy, pulmonary embolism on chronic anticoagulation with Eliquis, chronic hypoxic respiratory failure on 6 L of oxygen at home admitted on 04/22/2019 with with worsening respiratory symptoms and significant hypoxia--required continuous BiPAP after failing high flow oxygen -   A/p 1)Acute on chronic Hypoxic respiratory Failure---  --- -Procalcitonin is less than 0.1,  -Mild leukocytosis in the setting of steroid use -Afebrile -Troponin is not elevated, BNP 31,  - chest x-ray without volume overload/failure -COVID-19 negative -Procalcitonin is not elevated -right-sided pneumonia or CHF much  less likely,   IPF flareup is more likely the culprit for patient's worsening respiratory status --Continues to require  BiPAP for prolonged periods of time alternating with  15L HHFNC with an FIO2 of 100%.  -May use BiPAP 4 hours on 3 hours off during the day and continuously overnight -Continue IV Rocephin and doxycycline as ordered, mucolytics bronchodilators and supplemental oxygen as ordered -High-dose steroids   2)Acute COPD exacerbation/underlying ILD--- COPD exacerbation secondary to pneumonia--continue IV Solu-Medrol, bronchodilators mucolytics as ordered -IV antibiotics as above -BiPAP and supplemental oxygen as above -c/n OFEV for ILD --- Discussed with [name] patient's primary pulmonologist--- if this is a true IPF flareup then mortality is very very high in the short-term (- 90% -3 month mortality and 50% inpatient mortality with median survival 3-13 days)    3)Social/Ethics---  plan of care and CODE STATUS discussed with patient and husband at bedside. palliative care consult appreciated.  Patient is a full code  4)Chronic anticoagulation due to history of PE--- continue Eliquis  5)Anxiety disorder--- continue Prozac  6)Tobacco Abuse--nicotine patch as ordered  7)HFpEF--- last known EF 60 to 65%, patient with chronic diastolic dysfunction CHF--- currently appears euvolemic, no CHF exacerbation clinically and radiologically at this time  Disposition/Need for in-Hospital Stay- patient unable to be discharged at this time due to -requiring BiPAP for prolonged periods of time alternating with very high flow oxygen --Very symptomatic, respiratory  status remains very tenuous,   Code Status : Full  Family Communication:   (patient is alert, awake and coherent) -Discussed with patient's husband on 04/24/2019 at bedside  Disposition Plan  : TBD  Consults  : Phone consult with patient's pulmonologist [name] who is an IPF specialist  DVT Prophylaxis  : Eliquis- SCDs    Lab Results  Component Value Date   PLT 354 04/23/2019    Inpatient Medications  Scheduled Meds:  apixaban  5 mg Oral BID   Chlorhexidine Gluconate Cloth  6 each Topical Q0600   doxycycline  100 mg Oral Q12H   feeding supplement (ENSURE ENLIVE)  237 mL Oral BID BM   FLUoxetine  40 mg Oral Daily   gabapentin  600 mg Oral QID   guaiFENesin  600 mg Oral BID   methylPREDNISolone (SOLU-MEDROL) injection  80 mg Intravenous Q6H   multivitamin with minerals  1 tablet Oral QHS   nicotine  14 mg Transdermal Daily   Nintedanib  150 mg Oral BID   polyethylene glycol  17 g Oral Daily   sodium chloride flush  3 mL Intravenous Q12H   umeclidinium-vilanterol  1 puff Inhalation Daily   Continuous Infusions:  sodium chloride     cefTRIAXone (ROCEPHIN)  IV 1 g (04/26/19 1148)   PRN Meds:.sodium chloride, acetaminophen **OR** acetaminophen, albuterol, loperamide, ondansetron **OR** ondansetron (ZOFRAN) IV, oxyCODONE-acetaminophen, sodium chloride, sodium chloride flush, traZODone    Anti-infectives (From admission, onward)   Start     Dose/Rate Route Frequency Ordered Stop   04/22/19 1415  cefTRIAXone (ROCEPHIN) 1 g in sodium chloride 0.9 % 100 mL IVPB  Status:  Discontinued     1 g 200 mL/hr over 30 Minutes Intravenous Every 24 hours 04/22/19 1413 04/22/19 1417   04/22/19 1415  doxycycline (VIBRA-TABS) tablet 100 mg     100 mg Oral Every 12 hours 04/22/19 1413     04/22/19 1230  cefTRIAXone (ROCEPHIN) 1 g in sodium chloride 0.9 % 100 mL IVPB     1 g 200 mL/hr over 30 Minutes Intravenous Every 24 hours 04/22/19 1225     04/22/19 1230  azithromycin (ZITHROMAX) 500 mg in sodium chloride 0.9 % 250 mL IVPB  Status:  Discontinued     500 mg 250 mL/hr over 60 Minutes Intravenous Every 24 hours 04/22/19 1225 04/22/19 1703        Objective:   Vitals:   04/26/19 1500 04/26/19 1600 04/26/19 1635 04/26/19 1700  BP: 126/74 127/82  123/85  Pulse: 72 76 81 (!) 108  Resp: 17 (!)  23 19 (!) 21  Temp:   98 F (36.7 C)   TempSrc:   Oral   SpO2: 93% 95% 93% (!) 83%  Weight:      Height:        Wt Readings from Last 3 Encounters:  04/26/19 81.4 kg  04/08/19 83.9 kg  11/12/18 83.5 kg     Intake/Output Summary (Last 24 hours) at 04/26/2019 1723 Last data filed at 04/25/2019 1904 Gross per 24 hour  Intake --  Output 1 ml  Net -1 ml     Physical Exam  Gen:- Awake Alert, conversational dyspnea, increased work of breathing when she comes off BiPAP HEENT:- [place].AT, No sclera icterus Nose/Mouth--- BiPAP mask Neck-Supple Neck,No JVD,.  Lungs-diminished, Velcro type rales bilaterally, right-sided rhonchi  CV- S1, S2 normal, regular  Abd-  +ve B.Sounds, Abd Soft, No tenderness,    Extremity/Skin:- No  edema, pedal pulses present  Psych-affect is appropriate, oriented x3 Neuro-generalized weakness, no new focal deficits, no tremors   Data Review:   Micro Results Recent Results (from the past 240 hour(s))  SARS Coronavirus 2 [facility] order, Performed in [facility] hospital lab) Nasopharyngeal Nasopharyngeal Swab     Status: None   Collection Time: 04/22/19 11:46 AM   Specimen: Nasopharyngeal Swab  Result Value Ref Range Status   SARS Coronavirus 2 NEGATIVE NEGATIVE Final    Comment: (NOTE) If result is NEGATIVE SARS-CoV-2 target nucleic acids are NOT DETECTED. The SARS-CoV-2 RNA is generally detectable in upper and lower  respiratory specimens during the acute phase of infection. The lowest  concentration of SARS-CoV-2 viral copies this assay can detect is 250  copies / mL. A negative result does not preclude SARS-CoV-2 infection  and should not be used as the sole basis for treatment or other  patient management decisions.  A negative result may occur with  improper specimen collection / handling, submission of specimen other  than nasopharyngeal swab, presence of viral mutation(s) within the  areas targeted by this assay, and inadequate number of viral  copies  (<250 copies / mL). A negative result must be combined with clinical  observations, patient history, and epidemiological information. If result is POSITIVE SARS-CoV-2 target nucleic acids are DETECTED. The SARS-CoV-2 RNA is generally detectable in upper and lower  respiratory specimens dur ing the acute phase of infection.  Positive  results are indicative of active infection with SARS-CoV-2.  Clinical  correlation with patient history and other diagnostic information is  necessary to determine patient infection status.  Positive results do  not rule out bacterial infection or co-infection with other viruses. If result is PRESUMPTIVE POSTIVE SARS-CoV-2 nucleic acids MAY BE PRESENT.   A presumptive positive result was obtained on the submitted specimen  and confirmed on repeat testing.  While 2019 novel coronavirus  (SARS-CoV-2) nucleic acids may be present in the submitted sample  additional confirmatory testing may be necessary for epidemiological  and / or clinical management purposes  to differentiate between  SARS-CoV-2 and other Sarbecovirus currently known to infect humans.  If clinically indicated additional testing with an alternate test  methodology [phone]) is advised. The SARS-CoV-2 RNA is generally  detectable in upper and lower respiratory sp ecimens during the acute  phase of infection. The expected result is Negative. Fact Sheet for Patients:  StrictlyIdeas.no Fact Sheet for Healthcare Providers: BankingDealers.co.za This test is not yet approved or cleared by the [place] FDA and has been authorized for detection and/or diagnosis of SARS-CoV-2 by FDA under an Emergency Use Authorization (EUA).  This EUA will remain in effect (meaning this test can be used) for the duration of the COVID-19 declaration under Section 564(b)(1) of the Act, 21 U.S.C. section 360bbb-3(b)(1), unless the authorization is terminated  or revoked sooner. Performed at [facility], [address], [place], [place] [address]   MRSA PCR Screening     Status: None   Collection Time: 04/22/19  6:12 PM   Specimen: Nasopharyngeal  Result Value Ref Range Status   MRSA by PCR NEGATIVE NEGATIVE Final    Comment:        The GeneXpert MRSA Assay (FDA approved for NASAL specimens only), is one component of a comprehensive MRSA colonization surveillance program. It is not intended to diagnose MRSA infection nor to guide or monitor treatment for MRSA infections. Performed at [facility], [address], [place], [place] [address]     Radiology Reports  Dg Chest Port 1 View  Result Date: 04/25/2019 CLINICAL DATA:  Patient with trouble breathing for several days. EXAM: PORTABLE CHEST 1 VIEW COMPARISON:  April 22, 2019 FINDINGS: The patient has chronic interstitial lung disease. More focal opacity is seen in the right base than elsewhere which is new since April 14, 2019. No other interval changes. IMPRESSION: 1. Findings are chronic interstitial lung disease. 2. Persistent infiltrate in the right base worrisome for pneumonia or aspiration. Recommend follow-up to resolution. Electronically Signed   By: Dorise Bullion III M.D   On: 04/25/2019 09:37   Dg Chest Portable 1 View  Result Date: 04/22/2019 CLINICAL DATA:  Shortness of breath EXAM: PORTABLE CHEST 1 VIEW COMPARISON:  04/14/2019 FINDINGS: Cardiac silhouette is stable, appearing mildly enlarged. Diffuse chronic interstitial lung changes, with increasing interstitial opacity. More focal airspace consolidation in the right lower lobe. No pneumothorax. IMPRESSION: 1. Right lower lobe airspace consolidation suspicious for pneumonia in the appropriate clinical setting. 2. Diffuse chronic interstitial changes with suggestion of superimposed interstitial edema. Electronically Signed   By: Davina Poke M.D.   On: 04/22/2019 11:33     CBC Recent Labs  Lab 04/22/19 1229  04/23/19 0630  WBC 11.6* 12.1*  HGB 13.4 13.4  HCT 42.1 41.9  PLT 326 354  MCV 92.3 91.7  MCH 29.4 29.3  MCHC 31.8 32.0  RDW 12.6 12.4  LYMPHSABS 1.7  --   MONOABS 0.5  --   EOSABS 0.2  --   BASOSABS 0.0  --     Chemistries  Recent Labs  Lab 04/22/19 1229 04/23/19 0630 04/25/19 0514  NA 139 139 139  K 3.5 3.9 4.1  CL 101 102 104  CO2 27 25 24   GLUCOSE 91 132* 151*  BUN 18 18 25*  CREATININE 0.72 0.50 0.54  CALCIUM 8.5* 9.1 8.8*  AST 22  --   --   ALT 18  --   --   ALKPHOS 28*  --   --   BILITOT 1.1  --   --    ------------------------------------------------------------------------------------------------------------------ No results for input(s): CHOL, HDL, LDLCALC, TRIG, CHOLHDL, LDLDIRECT in the last 72 hours.  No results found for: HGBA1C ------------------------------------------------------------------------------------------------------------------ No results for input(s): TSH, T4TOTAL, T3FREE, THYROIDAB in the last 72 hours.  Invalid input(s): FREET3 ------------------------------------------------------------------------------------------------------------------ No results for input(s): VITAMINB12, FOLATE, FERRITIN, TIBC, IRON, RETICCTPCT in the last 72 hours.  Coagulation profile No results for input(s): INR, PROTIME in the last 168 hours.  No results for input(s): DDIMER in the last 72 hours.  Cardiac Enzymes No results for input(s): CKMB, TROPONINI, MYOGLOBIN in the last 168 hours.  Invalid input(s): CK ------------------------------------------------------------------------------------------------------------------    Component Value Date/Time   BNP 31.0 04/22/2019 1229   Lataria Courser M.D on 04/26/2019 at 5:23 PM  Go to [email or web address] - for contact info  [facility] - Office  [phone]

## 2019-04-27 LAB — BASIC METABOLIC PANEL WITH GFR
Anion gap: 5 (ref 5–15)
BUN: 27 mg/dL — ABNORMAL HIGH (ref 8–23)
CO2: 31 mmol/L (ref 22–32)
Calcium: 8.7 mg/dL — ABNORMAL LOW (ref 8.9–10.3)
Chloride: 103 mmol/L (ref 98–111)
Creatinine, Ser: 0.58 mg/dL (ref 0.44–1.00)
GFR calc Af Amer: >60 mL/min
GFR calc non Af Amer: >60 mL/min
Glucose, Bld: 179 mg/dL — ABNORMAL HIGH (ref 70–99)
Potassium: 4.1 mmol/L (ref 3.5–5.1)
Sodium: 139 mmol/L (ref 135–145)

## 2019-04-27 LAB — CBC
HCT: 40.1 % (ref 36.0–46.0)
Hemoglobin: 12.5 g/dL (ref 12.0–15.0)
MCH: 29.5 pg (ref 26.0–34.0)
MCHC: 31.2 g/dL (ref 30.0–36.0)
MCV: 94.6 fL (ref 80.0–100.0)
Platelets: 388 K/uL (ref 150–400)
RBC: 4.24 MIL/uL (ref 3.87–5.11)
RDW: 12.3 % (ref 11.5–15.5)
WBC: 14.5 K/uL — ABNORMAL HIGH (ref 4.0–10.5)
nRBC: 0 % (ref 0.0–0.2)

## 2019-04-27 LAB — GLUCOSE, CAPILLARY
Glucose-Capillary: 268 mg/dL — ABNORMAL HIGH (ref 70–99)
Glucose-Capillary: 146 mg/dL — ABNORMAL HIGH (ref 70–99)
Glucose-Capillary: 147 mg/dL — ABNORMAL HIGH (ref 70–99)

## 2019-04-27 MED ORDER — INSULIN ASPART 100 UNIT/ML ~~LOC~~ SOLN
0.0000 [IU] | Freq: Every day | SUBCUTANEOUS | Status: DC
  Administered 2019-04-28 – 2019-04-30 (×2): 2 [IU] via SUBCUTANEOUS

## 2019-04-27 MED ORDER — METHYLPREDNISOLONE SODIUM SUCC 40 MG IJ SOLR
40.0000 mg | Freq: Four times a day (QID) | INTRAMUSCULAR | Status: DC
  Administered 2019-04-27 – 2019-04-29 (×8): 40 mg via INTRAVENOUS
  Filled 2019-04-27 (×8): qty 1

## 2019-04-27 MED ORDER — INSULIN ASPART 100 UNIT/ML ~~LOC~~ SOLN
0.0000 [IU] | Freq: Three times a day (TID) | SUBCUTANEOUS | Status: DC
  Administered 2019-04-27: 5 [IU] via SUBCUTANEOUS
  Administered 2019-04-27 – 2019-04-28 (×2): 1 [IU] via SUBCUTANEOUS
  Administered 2019-04-28 (×2): 2 [IU] via SUBCUTANEOUS
  Administered 2019-04-29: 17:00:00 1 [IU] via SUBCUTANEOUS
  Administered 2019-04-29: 3 [IU] via SUBCUTANEOUS
  Administered 2019-04-29: 1 [IU] via SUBCUTANEOUS
  Administered 2019-04-30: 2 [IU] via SUBCUTANEOUS
  Administered 2019-04-30: 3 [IU] via SUBCUTANEOUS
  Administered 2019-04-30 – 2019-05-01 (×2): 2 [IU] via SUBCUTANEOUS
  Administered 2019-05-01 (×2): 5 [IU] via SUBCUTANEOUS
  Administered 2019-05-02: 3 [IU] via SUBCUTANEOUS
  Administered 2019-05-02: 08:00:00 2 [IU] via SUBCUTANEOUS
  Administered 2019-05-02: 1 [IU] via SUBCUTANEOUS
  Administered 2019-05-03 (×2): 3 [IU] via SUBCUTANEOUS
  Administered 2019-05-03: 2 [IU] via SUBCUTANEOUS

## 2019-04-27 NOTE — Progress Notes (Signed)
Performed bladder scan. Holding over 300 mls., Stated does not feel like pressure in abdomen. Asked if she would allow in and out catheter  to verify amount and she refused. Will let night shift know if she complains of pressure to try catheter.

## 2019-04-27 NOTE — Progress Notes (Addendum)
Patient Demographics:    Dawn Nichols, is a 63 y.o. female, DOB - 1956/01/01, GDJ:[id number]  Admit date - 04/22/2019   Admitting Physician Salman Wellen Denton Brick, MD  Outpatient Primary MD for the patient is [name], DO  LOS - 5  Chief Complaint  Patient presents with  . Shortness of Breath        Subjective:    [name] today has no fevers, no emesis,  No chest pain,   Sister at bedside, questions answered  -Had loose stools -Continue to struggle from a respiratory standpoint despite being on BiPAP and 15 L of high flow oxygen alternating   Assessment  & Plan :    Principal Problem:   Community acquired pneumonia of right lower lobe of lung ([place]) Active Problems:   Centrilobular emphysema (HCC)   Tobacco abuse   Chronic respiratory failure with hypoxia due to COPD/ILD-- on 6L/min at Home   H/o Pulmonary embolism - currently on Eliquis   Goals of care, counseling/discussion   Chronic heart failure with preserved ejection fraction (HFpEF)/dCHF -EF 60 to 65%   Palliative care by specialist   Interstitial pulmonary fibrosis ([place])   Dyspnea and respiratory abnormalities  Brief Summary  63 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, manufacturing systems with past medical history relevant for ILD/IPF, centrilobular emphysema, mediastinal adenopathy, pulmonary embolism on chronic anticoagulation with Eliquis, chronic hypoxic respiratory failure on 6 L of oxygen at home admitted on 04/22/2019 with with worsening respiratory symptoms and significant hypoxia--required continuous BiPAP after failing high flow oxygen - Suspected IPF flareup--high mortality risk and very poor short-term prognosis -Unable to discharge home or to a Facility as patient requires 15 L of oxygen frequent BiPAP --If LTAC appropriate...Marland KitchenMarland KitchenMarland Kitchen pulmonologist [name] recommends DNR status. patient waiting to talk to family   A/p 1)Acute on chronic Hypoxic  respiratory Failure---  --- -Procalcitonin is less than 0.1,  -Mild leukocytosis in the setting of steroid use -Afebrile -Troponin is not elevated, BNP 31,  - chest x-ray without volume overload/failure -COVID-19 negative -Procalcitonin is not elevated -right-sided pneumonia or CHF much  less likely,  IPF flareup is more likely the culprit for patient's worsening respiratory status --Continues to require  BiPAP for prolonged periods of time alternating with  15L HHFNC with an FIO2 of 100%.  -May use BiPAP 4 hours on 3 hours off during the day and continuously overnight -Continue IV Rocephin and doxycycline as ordered, mucolytics bronchodilators and supplemental oxygen as ordered Decrease Solu-Medrol to 40 mg every 6 hours from 80 mg every 6 hours   2)Acute COPD exacerbation/underlying ILD--- COPD exacerbation secondary to pneumonia--adjusted IV Solu-Medrol as above, bronchodilators mucolytics as ordered -IV antibiotics as above -BiPAP and supplemental oxygen as above -c/n OFEV for ILD --- Discussed with [name] patient's primary pulmonologist--- if this is a true IPF flareup then mortality is very very high in the short-term (- 90% -3 month mortality and 50% inpatient mortality with median survival 3-13 days)  3)Social/Ethics---  plan of care and CODE STATUS discussed with patient and husband at bedside. palliative care consult appreciated.  Patient is a full code  4)Chronic anticoagulation due to history of PE--- continue Eliquis  5)Anxiety disorder--- continue Prozac  6)Tobacco Abuse--nicotine patch as ordered  7)HFpEF--- last  known EF 27 to 65%, patient with chronic diastolic dysfunction CHF--- currently appears euvolemic, no CHF exacerbation clinically and radiologically at this time  8) steroid-induced hyperglycemia---- Use Novolog/Humalog Sliding scale insulin with Accu-Cheks/Fingersticks as ordered  --May improve after decreasing dose of Solu-Medrol today   Disposition/Need for in-Hospital Stay- patient unable to be discharged at this time due to -requiring BiPAP for prolonged periods of time alternating with very high flow oxygen --Very symptomatic, respiratory status remains very tenuous,  Code Status : Full ( pulmonologist [name] recommends DNR status. patient waiting to talk to family)  Family Communication:   (patient is alert, awake and coherent) -Discussed with patient's husband and patient's sister at patient's request  Disposition Plan  : -Unable to discharge home or to a Facility as patient requires 15 L of oxygen frequent BiPAP --If LTAC appropriate...Marland KitchenMarland KitchenMarland Kitchen  Consults  : Phone consult with patient's pulmonologist [name] who is an IPF specialist  DVT Prophylaxis  : Eliquis- SCDs   Lab Results  Component Value Date   PLT 388 04/27/2019   Inpatient Medications  Scheduled Meds: . apixaban  5 mg Oral BID  . Chlorhexidine Gluconate Cloth  6 each Topical Q0600  . doxycycline  100 mg Oral Q12H  . feeding supplement (ENSURE ENLIVE)  237 mL Oral BID BM  . FLUoxetine  40 mg Oral Daily  . gabapentin  600 mg Oral QID  . guaiFENesin  600 mg Oral BID  . insulin aspart  0-5 Units Subcutaneous QHS  . insulin aspart  0-9 Units Subcutaneous TID WC  . methylPREDNISolone (SOLU-MEDROL) injection  40 mg Intravenous Q6H  . multivitamin with minerals  1 tablet Oral QHS  . Nintedanib  150 mg Oral BID  . sodium chloride flush  3 mL Intravenous Q12H  . umeclidinium-vilanterol  1 puff Inhalation Daily   Continuous Infusions: . sodium chloride    . cefTRIAXone (ROCEPHIN)  IV 1 g (04/27/19 1223)   PRN Meds:.sodium chloride, acetaminophen **OR** acetaminophen, albuterol, loperamide, ondansetron **OR** ondansetron (ZOFRAN) IV, oxyCODONE-acetaminophen, sodium chloride, sodium chloride flush, traZODone    Anti-infectives (From admission, onward)   Start     Dose/Rate Route Frequency Ordered Stop   04/22/19 1415  cefTRIAXone  (ROCEPHIN) 1 g in sodium chloride 0.9 % 100 mL IVPB  Status:  Discontinued     1 g 200 mL/hr over 30 Minutes Intravenous Every 24 hours 04/22/19 1413 04/22/19 1417   04/22/19 1415  doxycycline (VIBRA-TABS) tablet 100 mg     100 mg Oral Every 12 hours 04/22/19 1413     04/22/19 1230  cefTRIAXone (ROCEPHIN) 1 g in sodium chloride 0.9 % 100 mL IVPB     1 g 200 mL/hr over 30 Minutes Intravenous Every 24 hours 04/22/19 1225     04/22/19 1230  azithromycin (ZITHROMAX) 500 mg in sodium chloride 0.9 % 250 mL IVPB  Status:  Discontinued     500 mg 250 mL/hr over 60 Minutes Intravenous Every 24 hours 04/22/19 1225 04/22/19 1703        Objective:   Vitals:   04/27/19 1124 04/27/19 1200 04/27/19 1300 04/27/19 1400  BP:  (!) 140/93 140/88 128/84  Pulse: 78 79 65 75  Resp: (!) 29 19 20  (!) 23  Temp: 98 F (36.7 C)     TempSrc: Oral     SpO2: 95% 96% 100% 96%  Weight:      Height:        Wt Readings from Last 3 Encounters:  04/27/19 82.3 kg  04/08/19 83.9 kg  11/12/18 83.5 kg     Intake/Output Summary (Last 24 hours) at 04/27/2019 1720 Last data filed at 04/27/2019 0817 Gross per 24 hour  Intake 300 ml  Output 402 ml  Net -102 ml     Physical Exam  Gen:- Awake Alert, conversational dyspnea, increased work of breathing when she comes off BiPAP HEENT:- [place].AT, No sclera icterus Nose/Mouth--- BiPAP mask Neck-Supple Neck,No JVD,.  Lungs-diminished, Velcro type rales bilaterally, right-sided rhonchi  CV- S1, S2 normal, regular  Abd-  +ve B.Sounds, Abd Soft, No tenderness,    Extremity/Skin:- No  edema, pedal pulses present  Psych-affect is appropriate, oriented x3 Neuro-generalized weakness, no new focal deficits, no tremors   Data Review:   Micro Results Recent Results (from the past 240 hour(s))  SARS Coronavirus 2 [facility] order, Performed in [facility] hospital lab) Nasopharyngeal Nasopharyngeal Swab     Status: None   Collection Time: 04/22/19 11:46 AM   Specimen:  Nasopharyngeal Swab  Result Value Ref Range Status   SARS Coronavirus 2 NEGATIVE NEGATIVE Final    Comment: (NOTE) If result is NEGATIVE SARS-CoV-2 target nucleic acids are NOT DETECTED. The SARS-CoV-2 RNA is generally detectable in upper and lower  respiratory specimens during the acute phase of infection. The lowest  concentration of SARS-CoV-2 viral copies this assay can detect is 250  copies / mL. A negative result does not preclude SARS-CoV-2 infection  and should not be used as the sole basis for treatment or other  patient management decisions.  A negative result may occur with  improper specimen collection / handling, submission of specimen other  than nasopharyngeal swab, presence of viral mutation(s) within the  areas targeted by this assay, and inadequate number of viral copies  (<250 copies / mL). A negative result must be combined with clinical  observations, patient history, and epidemiological information. If result is POSITIVE SARS-CoV-2 target nucleic acids are DETECTED. The SARS-CoV-2 RNA is generally detectable in upper and lower  respiratory specimens dur ing the acute phase of infection.  Positive  results are indicative of active infection with SARS-CoV-2.  Clinical  correlation with patient history and other diagnostic information is  necessary to determine patient infection status.  Positive results do  not rule out bacterial infection or co-infection with other viruses. If result is PRESUMPTIVE POSTIVE SARS-CoV-2 nucleic acids MAY BE PRESENT.   A presumptive positive result was obtained on the submitted specimen  and confirmed on repeat testing.  While 2019 novel coronavirus  (SARS-CoV-2) nucleic acids may be present in the submitted sample  additional confirmatory testing may be necessary for epidemiological  and / or clinical management purposes  to differentiate between  SARS-CoV-2 and other Sarbecovirus currently known to infect humans.  If clinically  indicated additional testing with an alternate test  methodology [phone]) is advised. The SARS-CoV-2 RNA is generally  detectable in upper and lower respiratory sp ecimens during the acute  phase of infection. The expected result is Negative. Fact Sheet for Patients:  StrictlyIdeas.no Fact Sheet for Healthcare Providers: BankingDealers.co.za This test is not yet approved or cleared by the [place] FDA and has been authorized for detection and/or diagnosis of SARS-CoV-2 by FDA under an Emergency Use Authorization (EUA).  This EUA will remain in effect (meaning this test can be used) for the duration of the COVID-19 declaration under Section 564(b)(1) of the Act, 21 U.S.C. section 360bbb-3(b)(1), unless the authorization is terminated or revoked sooner. Performed at  [facility], [address], [place], [place] [address]   MRSA PCR Screening     Status: None   Collection Time: 04/22/19  6:12 PM   Specimen: Nasopharyngeal  Result Value Ref Range Status   MRSA by PCR NEGATIVE NEGATIVE Final    Comment:        The GeneXpert MRSA Assay (FDA approved for NASAL specimens only), is one component of a comprehensive MRSA colonization surveillance program. It is not intended to diagnose MRSA infection nor to guide or monitor treatment for MRSA infections. Performed at [facility], [address] [place], [place] [address]     Radiology Reports Dg Chest [place] 1 View  Result Date: 04/25/2019 CLINICAL DATA:  Patient with trouble breathing for several days. EXAM: PORTABLE CHEST 1 VIEW COMPARISON:  April 22, 2019 FINDINGS: The patient has chronic interstitial lung disease. More focal opacity is seen in the right base than elsewhere which is new since April 14, 2019. No other interval changes. IMPRESSION: 1. Findings are chronic interstitial lung disease. 2. Persistent infiltrate in the right base worrisome for pneumonia or aspiration.  Recommend follow-up to resolution. Electronically Signed   By: Dorise Bullion III M.D   On: 04/25/2019 09:37   Dg Chest Portable 1 View  Result Date: 04/22/2019 CLINICAL DATA:  Shortness of breath EXAM: PORTABLE CHEST 1 VIEW COMPARISON:  04/14/2019 FINDINGS: Cardiac silhouette is stable, appearing mildly enlarged. Diffuse chronic interstitial lung changes, with increasing interstitial opacity. More focal airspace consolidation in the right lower lobe. No pneumothorax. IMPRESSION: 1. Right lower lobe airspace consolidation suspicious for pneumonia in the appropriate clinical setting. 2. Diffuse chronic interstitial changes with suggestion of superimposed interstitial edema. Electronically Signed   By: Davina Poke M.D.   On: 04/22/2019 11:33     CBC Recent Labs  Lab 04/22/19 1229 04/23/19 0630 04/27/19 0452  WBC 11.6* 12.1* 14.5*  HGB 13.4 13.4 12.5  HCT 42.1 41.9 40.1  PLT 326 354 388  MCV 92.3 91.7 94.6  MCH 29.4 29.3 29.5  MCHC 31.8 32.0 31.2  RDW 12.6 12.4 12.3  LYMPHSABS 1.7  --   --   MONOABS 0.5  --   --   EOSABS 0.2  --   --   BASOSABS 0.0  --   --     Chemistries  Recent Labs  Lab 04/22/19 1229 04/23/19 0630 04/25/19 0514 04/27/19 0452  NA 139 139 139 139  K 3.5 3.9 4.1 4.1  CL 101 102 104 103  CO2 27 25 24 31   GLUCOSE 91 132* 151* 179*  BUN 18 18 25* 27*  CREATININE 0.72 0.50 0.54 0.58  CALCIUM 8.5* 9.1 8.8* 8.7*  AST 22  --   --   --   ALT 18  --   --   --   ALKPHOS 28*  --   --   --   BILITOT 1.1  --   --   --    ------------------------------------------------------------------------------------------------------------------ No results for input(s): CHOL, HDL, LDLCALC, TRIG, CHOLHDL, LDLDIRECT in the last 72 hours.  No results found for: HGBA1C ------------------------------------------------------------------------------------------------------------------ No results for input(s): TSH, T4TOTAL, T3FREE, THYROIDAB in the last 72 hours.  Invalid  input(s): FREET3 ------------------------------------------------------------------------------------------------------------------ No results for input(s): VITAMINB12, FOLATE, FERRITIN, TIBC, IRON, RETICCTPCT in the last 72 hours.  Coagulation profile No results for input(s): INR, PROTIME in the last 168 hours.  No results for input(s): DDIMER in the last 72 hours.  Cardiac Enzymes No results for input(s): CKMB, TROPONINI,  MYOGLOBIN in the last 168 hours.  Invalid input(s): CK ------------------------------------------------------------------------------------------------------------------    Component Value Date/Time   BNP 31.0 04/22/2019 1229   Jania Steinke M.D on 04/27/2019 at 5:20 PM  Go to [email or web address] - for contact info  [facility] - Office  [phone]

## 2019-04-28 ENCOUNTER — Other Ambulatory Visit (HOSPITAL_COMMUNITY)
Admission: RE | Admit: 2019-04-28 | Discharge: 2019-04-28 | Disposition: A | Discharge status: Home / Self Care | Payer: 59 | Source: Ambulatory Visit | Attending: Internal Medicine | Admitting: Internal Medicine

## 2019-04-28 DIAGNOSIS — Z01812 Encounter for preprocedural laboratory examination: Secondary | ICD-10-CM | POA: Diagnosis not present

## 2019-04-28 LAB — GLUCOSE, CAPILLARY
Glucose-Capillary: 148 mg/dL — ABNORMAL HIGH (ref 70–99)
Glucose-Capillary: 183 mg/dL — ABNORMAL HIGH (ref 70–99)

## 2019-04-28 MED ORDER — ORAL CARE MOUTH RINSE
15.0000 mL | Freq: Two times a day (BID) | OROMUCOSAL | Status: DC
  Administered 2019-04-28 – 2019-05-03 (×10): 15 mL via OROMUCOSAL

## 2019-04-28 NOTE — TOC Initial Note (Addendum)
Transition of Care [facility]) - Initial/Assessment Note    Patient Details  Name: Dawn Nichols MRN: [id number] Date of Birth: [birth year]  [facility] CM/SW Contact:    Daizee Firmin, Chauncey Reading, RN Phone Number: 04/28/2019, 2:06 PM  Clinical Narrative:     IPF flare up. From home with husband. Wears 6 liters at home. Patient and husband met with palliative NP today.  Discussed with patient and husband via phone LTACH options. They report at this time they are unsure of want they want. Patient mentions she may want to return home with hospice. We discuss different options for hospice, including [facility] and how they have a facility but could not provide Bipap if needed in the home as well as [facility], who can provide Bipap in the home but do not have a hospice home if needed.   Patient would like time to consider all options. She is agreeable to having referral sent to [facility]. [facility] and [facility]. Both will assess patient's chart and insurance and will let CM know if patient qualifies.              ADDENDUM: 1545: Both [facility] can make a bed offer. Updated husband, he plans to discuss with patient tonight. TOC member will follow up with patient and husband tomorrow 04/29/19.  Expected Discharge Plan: Long Term Acute Care (LTAC) Barriers to Discharge: Other (comment)(LTACH benefits/qualifying)   Patient Goals and CMS Choice Patient states their goals for this hospitalization and ongoing recovery are:: return home CMS Medicare.gov Compare Post Acute Care list provided to:: Patient Choice offered to / list presented to : Patient  Expected Discharge Plan and Services Expected Discharge Plan: Long Term Acute Care (LTAC)     Post Acute Care Choice: Long Term Acute Care (LTAC)                       Prior Living Arrangements/Services     Patient language and need for interpreter reviewed:: Yes        Need for Family  Participation in Patient Care: Yes (Comment) Care giver support system in place?: Yes (comment)   Criminal Activity/Legal Involvement Pertinent to Current Situation/Hospitalization: No - Comment as needed  Activities of Daily Living Home Assistive Devices/Equipment: Eyeglasses, Oxygen, Bedside commode/3-in-1, CBG Meter, Grab bars in shower, Nebulizer, Dentures (specify type) ADL Screening (condition at time of admission) Patient's cognitive ability adequate to safely complete daily activities?: Yes Is the patient deaf or have difficulty hearing?: No Does the patient have difficulty seeing, even when wearing glasses/contacts?: No Does the patient have difficulty concentrating, remembering, or making decisions?: No Patient able to express need for assistance with ADLs?: Yes Does the patient have difficulty dressing or bathing?: No Independently performs ADLs?: Yes (appropriate for developmental age) Does the patient have difficulty walking or climbing stairs?: Yes Weakness of Legs: None Weakness of Arms/Hands: None  Permission Sought/Granted   Permission granted to share information with : Yes, Verbal Permission Granted     Permission granted to share info w AGENCY: [facility] reps        Emotional Assessment     Affect (typically observed): Accepting Orientation: : Oriented to Self, Oriented to Place, Oriented to  Time      Admission diagnosis:  Community acquired pneumonia of right lower lobe of lung ([place]) [J18.1] Patient Active Problem List   Diagnosis Date Noted  . Palliative  care by specialist   . Interstitial pulmonary fibrosis ([place])   . Dyspnea and respiratory abnormalities   . Community acquired pneumonia of right lower lobe of lung ([place]) 04/22/2019  . Chronic heart failure with preserved ejection fraction (HFpEF)/dCHF -EF 60 to 65% 04/22/2019  . 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 Stress and adjustment reaction 08/28/2018  . Drug-induced constipation  08/20/2018  . Lipoma of torso 08/20/2018  . Mediastinal adenopathy 08/06/2018  . Hilar adenopathy 08/06/2018  . Tobacco abuse 07/18/2018  . Goals of care, counseling/discussion 07/18/2018  . Chronic respiratory failure with hypoxia due to COPD/ILD-- on 6L/min at [facility] 07/18/2018  . Thrombus of right ventricle without MI 06/26/2018  . Lung mass   . Hypoxemia   . Ground glass opacity present on imaging of lung   . H/o Pulmonary embolism - currently on Eliquis 06/25/2018  . Acute CHF (congestive heart failure) ([place]) 06/25/2018  . Centrilobular emphysema ([place]) 06/25/2018  . History of non-ST elevation myocardial infarction (NSTEMI) 06/25/2018  . Chronic back pain 01/01/2014  . Scoliosis 01/01/2014  . Peripheral neuropathy 01/01/2014   PCP:  [name], DO Pharmacy:   [id number] [place]Phone: [phone]: [phone]    Social Determinants of Health (SDOH) Interventions    Readmission Risk Interventions No flowsheet data found.

## 2019-04-28 NOTE — Progress Notes (Signed)
Daily Progress Note   Patient Name: Dawn Nichols       Date: 04/28/2019 DOB: Aug 14, 1956  Age: 63 y.o. MRN#: [id number] Attending Physician: Kathie Dike, MD Primary Care Physician: [name], DO Admit Date: 04/22/2019  Reason for Consultation/Follow-up: Establishing goals of care  Subjective: Patient awake, alert, oriented. Sitting in chair this morning. States "I feel better." Remains on 15L HFNC and intermittent episodes of dyspnea at rest.   GOC:   Husband, Dawn Nichols at bedside.   Discussed course of hospitalization including diagnoses, interventions, and plan of care. Patient and husband understand her unfortunate irreversible condition and the likelihood that this wasn't pneumonia, but an IPF flare-up. Discussed possible eligibility for LTACH placement and explained that SW is following.   Patient and husband share that they discussed code status over the weekend and patient shares her decision for DNR code status, understanding recommendation for DNR/DNI by pulmonology, attending, and palliative provider.   Discussed and completed electronic MOST form with patient and husband. Patient's wishes include: DNR/DNI, limited interventions including BiPAP/CPAP if indicated, IVF/ABX if indicated, and NO feeding tube. Durable DNR completed. Copies of MOST and Durable DNR made for chart, patient, and family. Electronic MOST form available on epic. Patient declines completing AD packet with chaplain.   Patient does verbalize thoughts on wanting to be home if time was short. Her and husband are willing to consider LTACH first if this is an option.   Answered questions and concerns about plan of care. PMT contact information given. Patient/husband appreciative.    Length of Stay: 6   Current Medications: Scheduled Meds:  . apixaban  5 mg Oral BID  . Chlorhexidine Gluconate Cloth  6 each Topical Q0600  . doxycycline  100 mg Oral Q12H  . feeding supplement (ENSURE ENLIVE)  237 mL Oral BID BM  . FLUoxetine  40 mg Oral Daily  . gabapentin  600 mg Oral QID  . guaiFENesin  600 mg Oral BID  . insulin aspart  0-5 Units Subcutaneous QHS  . insulin aspart  0-9 Units Subcutaneous TID WC  . methylPREDNISolone (SOLU-MEDROL) injection  40 mg Intravenous Q6H  . multivitamin with minerals  1 tablet Oral QHS  . Nintedanib  150 mg Oral BID  . sodium chloride flush  3 mL Intravenous Q12H  . umeclidinium-vilanterol  1 puff Inhalation Daily    Continuous Infusions: . sodium chloride    . cefTRIAXone (ROCEPHIN)  IV Stopped (04/27/19 1256)    PRN Meds: sodium chloride, acetaminophen **OR** acetaminophen, albuterol, loperamide, ondansetron **OR** ondansetron (ZOFRAN) IV, oxyCODONE-acetaminophen, sodium chloride, sodium chloride flush, traZODone  Physical Exam Vitals signs and nursing note reviewed.  Constitutional:      General: She is awake.  HENT:     Head: Normocephalic and atraumatic.  Cardiovascular:     Rate and Rhythm: Normal rate.  Pulmonary:     Effort: No tachypnea, accessory muscle usage or respiratory distress.     Comments: 15L HFNC, intermittent dyspnea at rest Skin:    General: Skin is warm and dry.  Neurological:     Mental Status: She is alert and oriented to person, place, and time.  Psychiatric:        Mood and Affect: Mood normal.        Speech: Speech normal.        Behavior: Behavior normal.        Cognition and Memory: Cognition normal.            Vital Signs: BP 121/75 (BP Location: Right Arm)   Pulse (!) 54   Temp 98 F (36.7 C) (Oral)   Resp 12   Ht 5\' 3"  (1.6 m)   Wt 83.3 kg   SpO2 98%   BMI 32.53 kg/m  SpO2: SpO2: 98 % O2 Device: O2 Device: High Flow Nasal Cannula O2 Flow Rate: O2 Flow Rate (L/min): 15 L/min  Intake/output  summary:   Intake/Output Summary (Last 24 hours) at 04/28/2019 1129 Last data filed at 04/28/2019 3875 Gross per 24 hour  Intake 540 ml  Output 401 ml  Net 139 ml   LBM: Last BM Date: 04/28/19 Baseline Weight: Weight: 83.9 kg Most recent weight: Weight: 83.3 kg       Palliative Assessment/Data: PPS 40%    Flowsheet Rows     Most Recent Value  Intake Tab  Referral Department  Hospitalist  Unit at Time of Referral  ICU  Palliative Care Primary Diagnosis  Pulmonary  Palliative Care Type  New Palliative care  Reason for referral  Clarify Goals of Care  Date first seen by Palliative Care  04/24/19  Clinical Assessment  Palliative Performance Scale Score  40%  Psychosocial & Spiritual Assessment  Palliative Care Outcomes  Patient/Family meeting held?  Yes  Who was at the meeting?  patient and husband  Palliative Care Outcomes  Clarified goals of care, Counseled regarding hospice, Provided end of life care assistance, Provided psychosocial or spiritual support, ACP counseling assistance      Patient Active Problem List   Diagnosis Date Noted  . Palliative care by specialist   . Interstitial pulmonary fibrosis ([place])   . Dyspnea and respiratory abnormalities   . Community acquired pneumonia of right lower lobe of lung ([place]) 04/22/2019  . Chronic heart failure with preserved ejection fraction (HFpEF)/dCHF -EF 60 to 65% 04/22/2019  . 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 Stress and adjustment reaction 08/28/2018  . Drug-induced constipation 08/20/2018  . Lipoma of torso 08/20/2018  . Mediastinal adenopathy 08/06/2018  . Hilar adenopathy 08/06/2018  . Tobacco abuse 07/18/2018  . Goals of care, counseling/discussion 07/18/2018  . Chronic respiratory failure with hypoxia due to COPD/ILD-- on 6L/min at [facility] 07/18/2018  . Thrombus of right ventricle without MI 06/26/2018  . Lung mass   . Hypoxemia   . Ground glass opacity present  on imaging of lung   . H/o Pulmonary embolism -  currently on Eliquis 06/25/2018  . Acute CHF (congestive heart failure) ([place]) 06/25/2018  . Centrilobular emphysema ([place]) 06/25/2018  . History of non-ST elevation myocardial infarction (NSTEMI) 06/25/2018  . Chronic back pain 01/01/2014  . Scoliosis 01/01/2014  . Peripheral neuropathy 01/01/2014    Palliative Care Assessment & Plan   Patient Profile: 63 y.o. female  with past medical history of ILD/IPF on 6L home oxygen, centrilobular emphysema, PE on Eliquis admitted on 04/22/2019 with worsening respiratory symptoms and hypoxia. Followed by [name] for ILD/IPF. Recent visit on 7/28 and CT chest with worsening fibrosis. Patient started on antibiotics, steroids, bronchodilators, and mucolytics with possible right-sided pneumonia. Patient requiring 15L HFNC and BiPAP use. If this is IPF flare-up, [name] suspects very poor prognosis. Palliative medicine consultation for goals of care.   Assessment: Acute on chronic hypoxic respiratory failure IPF exacerbation Dyspnea Weakness  Recommendations/Plan:  Electronic MOST form completed with patient and husband. Patient's wishes include: DNR/DNI, limited interventions including CPAP/BiPAP if indicated, IVF/ABX if indicated, and NO feeding tube. Durable DNR completed. Copies made for chart, patient, and family.   Continue current plan of care and medical management per attending.   TOC RN/SW following for possible LTACH eligibility?   PMT will follow inpatient.    Code Status: DNR/DNI   Code Status Orders  (From admission, onward)         Start     Ordered   04/28/19 1126  Do not attempt resuscitation (DNR)  Continuous    Question Answer Comment  In the event of cardiac or respiratory ARREST Do not call a "code blue"   In the event of cardiac or respiratory ARREST Do not perform Intubation, CPR, defibrillation or ACLS   In the event of cardiac or respiratory ARREST Use medication by any route, position, wound care, and  other measures to relive pain and suffering. May use oxygen, suction and manual treatment of airway obstruction as needed for comfort.      04/28/19 1125        Code Status History    Date Active Date Inactive Code Status Order ID Comments User Context   04/22/2019 1717 04/28/2019 1125 Full Code [id number]  Roxan Hockey, MD Inpatient   06/25/2018 0406 06/28/2018 1951 Full Code [id number]  Rise Patience, MD Inpatient   Advance Care Planning Activity       Prognosis:  Poor long-term with acute on chronic respiratory failure with underlying IPF.  Discharge Planning:  To Be Determined  Care plan was discussed with patient, husband, RN, Dr Roderic Palau  Thank you for allowing the Palliative Medicine Team to assist in the care of this patient.   Time In: 1045 Time Out: 1130 Total Time 45 Prolonged Time Billed no      Greater than 50%  of this time was spent counseling and coordinating care related to the above assessment and plan.  Ihor Dow, DNP, FNP-C Palliative Medicine Team  Phone: [phone] Fax: [phone]  Please contact Palliative Medicine Team phone at [phone] for questions and concerns.

## 2019-04-28 NOTE — Progress Notes (Signed)
Patient Demographics:    Dawn Nichols, is a 63 y.o. female, DOB - 1956/08/08, HKV:[id number]  Admit date - 04/22/2019   Admitting Physician [name], MD  Outpatient Primary MD for the patient is [name], DO  LOS - 6  Chief Complaint  Patient presents with  . Shortness of Breath        Subjective:    Dawn Nichols has not had any nausea or vomiting. Feels that breathing is about the same. Does not feel short of breath if she is not moving. Still requiring bipap and high flow oxygen   Assessment  & Plan :    Principal Problem:   Community acquired pneumonia of right lower lobe of lung ([place]) Active Problems:   H/o Pulmonary embolism - currently on Eliquis   Centrilobular emphysema ([facility])   Tobacco abuse   Goals of care, counseling/discussion   Chronic respiratory failure with hypoxia due to COPD/ILD-- on 6L/min at Home   Chronic heart failure with preserved ejection fraction (HFpEF)/dCHF -EF 60 to 65%   Palliative care by specialist   Interstitial pulmonary fibrosis ([facility])   Dyspnea and respiratory abnormalities  Brief Summary  63 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, manufacturing systems with past medical history relevant for ILD/IPF, centrilobular emphysema, mediastinal adenopathy, pulmonary embolism on chronic anticoagulation with Eliquis, chronic hypoxic respiratory failure on 6 L of oxygen at home admitted on 04/22/2019 with with worsening respiratory symptoms and significant hypoxia--required continuous BiPAP after failing high flow oxygen - Suspected IPF flareup--high mortality risk and very poor short-term prognosis -Unable to discharge home or to a Facility as patient requires 15 L of oxygen frequent BiPAP --If LTAC appropriate...Marland KitchenMarland KitchenMarland Kitchen pulmonologist [name] recommends DNR status. patient waiting to talk to family   A/p 1)Acute on chronic Hypoxic respiratory Failure---  --- -Procalcitonin is less than  0.1,  -Mild leukocytosis in the setting of steroid use -Afebrile -Troponin is not elevated, BNP 31,  - chest x-ray without volume overload/failure -COVID-19 negative -Procalcitonin is not elevated -right-sided pneumonia or CHF much  less likely,  IPF flareup is more likely the culprit for patient's worsening respiratory status --Continues to require  BiPAP for prolonged periods of time alternating with  15L HHFNC with an FIO2 of 100%.  -May use BiPAP 4 hours on 3 hours off during the day and continuously overnight -Continue IV Rocephin and doxycycline as ordered, mucolytics bronchodilators and supplemental oxygen as ordered Decrease Solu-Medrol to 40 mg every 6 hours from 80 mg every 6 hours  -We will continue to titrate oxygen down while keeping oxygen saturations greater than 85%, as long as patient is not symptomatic.  She likely has some degree of chronic hypoxia.  2)Acute COPD exacerbation/underlying ILD--- COPD exacerbation secondary to pneumonia--adjusted IV Solu-Medrol as above, bronchodilators mucolytics as ordered -IV antibiotics as above -BiPAP and supplemental oxygen as above -c/n OFEV for ILD --- Discussed with [name] patient's primary pulmonologist--- if this is a true IPF flareup then mortality is very very high in the short-term (- 90% -3 month mortality and 50% inpatient mortality with median survival 3-13 days)  3)Social/Ethics---  discussed with palliative care and patient has agreed to DNR status.  4)Chronic anticoagulation due to history of PE--- continue Eliquis  5)Anxiety disorder--- continue  Prozac  6)Tobacco Abuse--nicotine patch as ordered  7)HFpEF--- last known EF 60 to 65%, patient with chronic diastolic dysfunction CHF--- currently appears euvolemic, no CHF exacerbation clinically and radiologically at this time  8) steroid-induced hyperglycemia---- Use Novolog/Humalog Sliding scale insulin with Accu-Cheks/Fingersticks as ordered  --May improve  after decreasing dose of Solu-Medrol today  Disposition/Need for in-Hospital Stay- patient unable to be discharged at this time due to -requiring BiPAP for prolonged periods of time alternating with very high flow oxygen --Asked case management to review options of possible LTAC as a bridge home.  If patient does not improve in the next several days, may need to reconsider goals and discuss hospice.  Code Status : DNR  Family Communication:   (patient is alert, awake and coherent) -Discussed with patient  Disposition Plan  : -Unable to discharge home or to a Facility as patient requires 15 L of oxygen frequent BiPAP --If LTAC appropriate...Marland KitchenMarland KitchenMarland Kitchen  Consults  : Phone consult with patient's pulmonologist [name] who is an IPF specialist Palliative medicine  DVT Prophylaxis  : Eliquis- SCDs   Lab Results  Component Value Date   PLT 388 04/27/2019   Inpatient Medications  Scheduled Meds: . apixaban  5 mg Oral BID  . Chlorhexidine Gluconate Cloth  6 each Topical Q0600  . doxycycline  100 mg Oral Q12H  . feeding supplement (ENSURE ENLIVE)  237 mL Oral BID BM  . FLUoxetine  40 mg Oral Daily  . gabapentin  600 mg Oral QID  . guaiFENesin  600 mg Oral BID  . insulin aspart  0-5 Units Subcutaneous QHS  . insulin aspart  0-9 Units Subcutaneous TID WC  . methylPREDNISolone (SOLU-MEDROL) injection  40 mg Intravenous Q6H  . multivitamin with minerals  1 tablet Oral QHS  . Nintedanib  150 mg Oral BID  . sodium chloride flush  3 mL Intravenous Q12H  . umeclidinium-vilanterol  1 puff Inhalation Daily   Continuous Infusions: . sodium chloride    . cefTRIAXone (ROCEPHIN)  IV 1 g (04/27/19 1223)   PRN Meds:.sodium chloride, acetaminophen **OR** acetaminophen, albuterol, loperamide, ondansetron **OR** ondansetron (ZOFRAN) IV, oxyCODONE-acetaminophen, sodium chloride, sodium chloride flush, traZODone    Anti-infectives (From admission, onward)   Start     Dose/Rate Route Frequency  Ordered Stop   04/22/19 1415  cefTRIAXone (ROCEPHIN) 1 g in sodium chloride 0.9 % 100 mL IVPB  Status:  Discontinued     1 g 200 mL/hr over 30 Minutes Intravenous Every 24 hours 04/22/19 1413 04/22/19 1417   04/22/19 1415  doxycycline (VIBRA-TABS) tablet 100 mg     100 mg Oral Every 12 hours 04/22/19 1413     04/22/19 1230  cefTRIAXone (ROCEPHIN) 1 g in sodium chloride 0.9 % 100 mL IVPB     1 g 200 mL/hr over 30 Minutes Intravenous Every 24 hours 04/22/19 1225     04/22/19 1230  azithromycin (ZITHROMAX) 500 mg in sodium chloride 0.9 % 250 mL IVPB  Status:  Discontinued     500 mg 250 mL/hr over 60 Minutes Intravenous Every 24 hours 04/22/19 1225 04/22/19 1703        Objective:   Vitals:   04/28/19 0600 04/28/19 0621 04/28/19 0623 04/28/19 0748  BP: 121/75     Pulse: (!) 57 66  (!) 54  Resp: (!) 21 17  12   Temp:    98 F (36.7 C)  TempSrc:    Oral  SpO2: 99% 97%  98%  Weight:   83.3  kg   Height:        Wt Readings from Last 3 Encounters:  04/28/19 83.3 kg  04/08/19 83.9 kg  11/12/18 83.5 kg     Intake/Output Summary (Last 24 hours) at 04/28/2019 9211 Last data filed at 04/28/2019 0622 Gross per 24 hour  Intake 440 ml  Output 401 ml  Net 39 ml     Physical Exam  General exam: Alert, awake, oriented x 3 Respiratory system: bilateral crackles. Respiratory effort normal. Cardiovascular system:RRR. No murmurs, rubs, gallops. Gastrointestinal system: Abdomen is nondistended, soft and nontender. No organomegaly or masses felt. Normal bowel sounds heard. Central nervous system: Alert and oriented. No focal neurological deficits. Extremities: No C/C/E, +pedal pulses Skin: No rashes, lesions or ulcers Psychiatry: Judgement and insight appear normal. Mood & affect appropriate.     Data Review:   Micro Results Recent Results (from the past 240 hour(s))  SARS Coronavirus 2 [facility] order, Performed in [facility] hospital lab) Nasopharyngeal Nasopharyngeal Swab      Status: None   Collection Time: 04/22/19 11:46 AM   Specimen: Nasopharyngeal Swab  Result Value Ref Range Status   SARS Coronavirus 2 NEGATIVE NEGATIVE Final    Comment: (NOTE) If result is NEGATIVE SARS-CoV-2 target nucleic acids are NOT DETECTED. The SARS-CoV-2 RNA is generally detectable in upper and lower  respiratory specimens during the acute phase of infection. The lowest  concentration of SARS-CoV-2 viral copies this assay can detect is 250  copies / mL. A negative result does not preclude SARS-CoV-2 infection  and should not be used as the sole basis for treatment or other  patient management decisions.  A negative result may occur with  improper specimen collection / handling, submission of specimen other  than nasopharyngeal swab, presence of viral mutation(s) within the  areas targeted by this assay, and inadequate number of viral copies  (<250 copies / mL). A negative result must be combined with clinical  observations, patient history, and epidemiological information. If result is POSITIVE SARS-CoV-2 target nucleic acids are DETECTED. The SARS-CoV-2 RNA is generally detectable in upper and lower  respiratory specimens dur ing the acute phase of infection.  Positive  results are indicative of active infection with SARS-CoV-2.  Clinical  correlation with patient history and other diagnostic information is  necessary to determine patient infection status.  Positive results do  not rule out bacterial infection or co-infection with other viruses. If result is PRESUMPTIVE POSTIVE SARS-CoV-2 nucleic acids MAY BE PRESENT.   A presumptive positive result was obtained on the submitted specimen  and confirmed on repeat testing.  While 2019 novel coronavirus  (SARS-CoV-2) nucleic acids may be present in the submitted sample  additional confirmatory testing may be necessary for epidemiological  and / or clinical management purposes  to differentiate between  SARS-CoV-2 and other  Sarbecovirus currently known to infect humans.  If clinically indicated additional testing with an alternate test  methodology [phone]) is advised. The SARS-CoV-2 RNA is generally  detectable in upper and lower respiratory sp ecimens during the acute  phase of infection. The expected result is Negative. Fact Sheet for Patients:  StrictlyIdeas.no Fact Sheet for Healthcare Providers: BankingDealers.co.za This test is not yet approved or cleared by the [place] FDA and has been authorized for detection and/or diagnosis of SARS-CoV-2 by FDA under an Emergency Use Authorization (EUA).  This EUA will remain in effect (meaning this test can be used) for the duration of the COVID-19 declaration under Section 564(b)(1) of  the Act, 21 U.S.C. section 360bbb-3(b)(1), unless the authorization is terminated or revoked sooner. Performed at [facility], [address], [place], [place] [address]   MRSA PCR Screening     Status: None   Collection Time: 04/22/19  6:12 PM   Specimen: Nasopharyngeal  Result Value Ref Range Status   MRSA by PCR NEGATIVE NEGATIVE Final    Comment:        The GeneXpert MRSA Assay (FDA approved for NASAL specimens only), is one component of a comprehensive MRSA colonization surveillance program. It is not intended to diagnose MRSA infection nor to guide or monitor treatment for MRSA infections. Performed at [facility], [address] [place], [place] [address]     Radiology Reports Dg Chest [place] 1 View  Result Date: 04/25/2019 CLINICAL DATA:  Patient with trouble breathing for several days. EXAM: PORTABLE CHEST 1 VIEW COMPARISON:  April 22, 2019 FINDINGS: The patient has chronic interstitial lung disease. More focal opacity is seen in the right base than elsewhere which is new since April 14, 2019. No other interval changes. IMPRESSION: 1. Findings are chronic interstitial lung disease. 2. Persistent infiltrate  in the right base worrisome for pneumonia or aspiration. Recommend follow-up to resolution. Electronically Signed   By: Dorise Bullion III M.D   On: 04/25/2019 09:37   Dg Chest Portable 1 View  Result Date: 04/22/2019 CLINICAL DATA:  Shortness of breath EXAM: PORTABLE CHEST 1 VIEW COMPARISON:  04/14/2019 FINDINGS: Cardiac silhouette is stable, appearing mildly enlarged. Diffuse chronic interstitial lung changes, with increasing interstitial opacity. More focal airspace consolidation in the right lower lobe. No pneumothorax. IMPRESSION: 1. Right lower lobe airspace consolidation suspicious for pneumonia in the appropriate clinical setting. 2. Diffuse chronic interstitial changes with suggestion of superimposed interstitial edema. Electronically Signed   By: Davina Poke M.D.   On: 04/22/2019 11:33     CBC Recent Labs  Lab 04/22/19 1229 04/23/19 0630 04/27/19 0452  WBC 11.6* 12.1* 14.5*  HGB 13.4 13.4 12.5  HCT 42.1 41.9 40.1  PLT 326 354 388  MCV 92.3 91.7 94.6  MCH 29.4 29.3 29.5  MCHC 31.8 32.0 31.2  RDW 12.6 12.4 12.3  LYMPHSABS 1.7  --   --   MONOABS 0.5  --   --   EOSABS 0.2  --   --   BASOSABS 0.0  --   --     Chemistries  Recent Labs  Lab 04/22/19 1229 04/23/19 0630 04/25/19 0514 04/27/19 0452  NA 139 139 139 139  K 3.5 3.9 4.1 4.1  CL 101 102 104 103  CO2 27 25 24 31   GLUCOSE 91 132* 151* 179*  BUN 18 18 25* 27*  CREATININE 0.72 0.50 0.54 0.58  CALCIUM 8.5* 9.1 8.8* 8.7*  AST 22  --   --   --   ALT 18  --   --   --   ALKPHOS 28*  --   --   --   BILITOT 1.1  --   --   --    ------------------------------------------------------------------------------------------------------------------ No results for input(s): CHOL, HDL, LDLCALC, TRIG, CHOLHDL, LDLDIRECT in the last 72 hours.  No results found for: HGBA1C ------------------------------------------------------------------------------------------------------------------ No results for input(s): TSH,  T4TOTAL, T3FREE, THYROIDAB in the last 72 hours.  Invalid input(s): FREET3 ------------------------------------------------------------------------------------------------------------------ No results for input(s): VITAMINB12, FOLATE, FERRITIN, TIBC, IRON, RETICCTPCT in the last 72 hours.  Coagulation profile No results for input(s): INR, PROTIME in the last 168 hours.  No results for  input(s): DDIMER in the last 72 hours.  Cardiac Enzymes No results for input(s): CKMB, TROPONINI, MYOGLOBIN in the last 168 hours.  Invalid input(s): CK ------------------------------------------------------------------------------------------------------------------    Component Value Date/Time   BNP 31.0 04/22/2019 1229   Kathie Dike M.D on 04/28/2019 at 9:38 AM  Go to [email or web address] - for contact info  [facility] - Office  [phone]

## 2019-04-29 DIAGNOSIS — J9621 Acute and chronic respiratory failure with hypoxia: Secondary | ICD-10-CM

## 2019-04-29 DIAGNOSIS — R0602 Shortness of breath: Secondary | ICD-10-CM

## 2019-04-29 DIAGNOSIS — J441 Chronic obstructive pulmonary disease with (acute) exacerbation: Secondary | ICD-10-CM

## 2019-04-29 LAB — GLUCOSE, CAPILLARY
Glucose-Capillary: 188 mg/dL — ABNORMAL HIGH (ref 70–99)
Glucose-Capillary: 225 mg/dL — ABNORMAL HIGH (ref 70–99)
Glucose-Capillary: 147 mg/dL — ABNORMAL HIGH (ref 70–99)
Glucose-Capillary: 242 mg/dL — ABNORMAL HIGH (ref 70–99)
Glucose-Capillary: 150 mg/dL — ABNORMAL HIGH (ref 70–99)
Glucose-Capillary: 180 mg/dL — ABNORMAL HIGH (ref 70–99)

## 2019-04-29 MED ORDER — BUDESONIDE 0.5 MG/2ML IN SUSP
0.5000 mg | Freq: Two times a day (BID) | RESPIRATORY_TRACT | Status: DC
  Administered 2019-04-29 – 2019-05-03 (×9): 0.5 mg via RESPIRATORY_TRACT
  Filled 2019-04-29 (×9): qty 2

## 2019-04-29 MED ORDER — ARFORMOTEROL TARTRATE 15 MCG/2ML IN NEBU
15.0000 ug | INHALATION_SOLUTION | Freq: Two times a day (BID) | RESPIRATORY_TRACT | Status: DC
  Administered 2019-04-29 – 2019-05-03 (×9): 15 ug via RESPIRATORY_TRACT
  Filled 2019-04-29 (×9): qty 2

## 2019-04-29 MED ORDER — METHYLPREDNISOLONE SODIUM SUCC 125 MG IJ SOLR
60.0000 mg | Freq: Three times a day (TID) | INTRAMUSCULAR | Status: DC
  Administered 2019-04-29 – 2019-05-03 (×14): 60 mg via INTRAVENOUS
  Filled 2019-04-29 (×14): qty 2

## 2019-04-29 MED ORDER — PANTOPRAZOLE SODIUM 40 MG PO TBEC
40.0000 mg | DELAYED_RELEASE_TABLET | Freq: Every day | ORAL | Status: DC
  Administered 2019-04-29 – 2019-05-03 (×5): 40 mg via ORAL
  Filled 2019-04-29 (×5): qty 1

## 2019-04-29 NOTE — TOC Progression Note (Signed)
Transition of Care [facility]) - Progression Note    Patient Details  Name: Dawn Nichols MRN: [id number] Date of Birth: 09/29/1955  Transition of Care [facility]) CM/SW Contact  Ihor Gully, LCSW Phone Number: 04/29/2019, 12:32 PM  Clinical Narrative:    Discussed with patient and spouse separately as [name] is at work discharge plan options.  Discussed that [facility] option as [facility] could no longer make their offer due to medication issues.  Home with Hospice was also discussed as an option.  [name] states that he has not had an opportunity to discuss discharge plan with patient because he had to work today. His goal is to discuss discharge plan with patient tomorrow evening. Patient stated that if she went to [facility] was her husband's preference.  Patient states that while she would rather go home she does not have anyone who could stay with her in the home while her husband was at work. She was aware that Hospice was not able to be in the home while her husband worked.  Patient and spouse to discuss discharge plan and notify LCSW. [name] at [employer] [facility]	[place] notified that patient/spouse had not made a decision regarding discharge plan.    Expected Discharge Plan: Long Term Acute Care (LTAC) Barriers to Discharge: Other (comment)(LTACH benefits/qualifying)  Expected Discharge Plan and Services Expected Discharge Plan: Long Term Acute Care (LTAC)     Post Acute Care Choice: Long Term Acute Care (LTAC)                                         Social Determinants of Health (SDOH) Interventions    Readmission Risk Interventions No flowsheet data found.

## 2019-04-29 NOTE — Progress Notes (Signed)
Daily Progress Note   Patient Name: Dawn Nichols       Date: 04/29/2019 DOB: [birth year]  Age: 63 y.o. MRN#: [id number] Attending Physician: Barton Dubois, MD Primary Care Physician: [name], DO Admit Date: 04/22/2019  Reason for Consultation/Follow-up: Establishing goals of care  Subjective/GOC: F/u with patient. Son at bedside. Patient sitting up in bed and ate 100% of lunch.   She speaks of plan to attempt oxygen wean today.   [place] spoke with SW about LTACH vs. Home with hospice. She plans to further discuss with her husband this evening. Patient's ultimate goal is to return home but agreeable to [facility] if her and her husband decide this would be best. Answered questions regarding hospice philosophy and options.    Length of Stay: 7  Current Medications: Scheduled Meds:  . apixaban  5 mg Oral BID  . arformoterol  15 mcg Nebulization BID  . budesonide (PULMICORT) nebulizer solution  0.5 mg Nebulization BID  . Chlorhexidine Gluconate Cloth  6 each Topical Q0600  . feeding supplement (ENSURE ENLIVE)  237 mL Oral BID BM  . FLUoxetine  40 mg Oral Daily  . gabapentin  600 mg Oral QID  . guaiFENesin  600 mg Oral BID  . insulin aspart  0-5 Units Subcutaneous QHS  . insulin aspart  0-9 Units Subcutaneous TID WC  . mouth rinse  15 mL Mouth Rinse BID  . methylPREDNISolone (SOLU-MEDROL) injection  60 mg Intravenous Q8H  . multivitamin with minerals  1 tablet Oral QHS  . Nintedanib  150 mg Oral BID  . sodium chloride flush  3 mL Intravenous Q12H    Continuous Infusions: . sodium chloride      PRN Meds: sodium chloride, acetaminophen **OR** acetaminophen, albuterol, loperamide, ondansetron **OR** ondansetron (ZOFRAN) IV, oxyCODONE-acetaminophen, sodium chloride, sodium chloride  flush, traZODone  Physical Exam Vitals signs and nursing note reviewed.  Constitutional:      General: She is awake.  HENT:     Head: Normocephalic and atraumatic.  Cardiovascular:     Rate and Rhythm: Normal rate.  Pulmonary:     Effort: No tachypnea, accessory muscle usage or respiratory distress.     Comments: 15L HFNC, intermittent dyspnea at rest Skin:    General: Skin is warm and dry.  Neurological:     Mental  Status: She is alert and oriented to person, place, and time.  Psychiatric:        Mood and Affect: Mood normal.        Speech: Speech normal.        Behavior: Behavior normal.        Cognition and Memory: Cognition normal.            Vital Signs: BP 119/79   Pulse 77   Temp 98.2 F (36.8 C) (Oral)   Resp 15   Ht 5\' 3"  (1.6 m)   Wt 84.5 kg   SpO2 90%   BMI 33.00 kg/m  SpO2: SpO2: 90 % O2 Device: O2 Device: High Flow Nasal Cannula O2 Flow Rate: O2 Flow Rate (L/min): 15 L/min  Intake/output summary:   Intake/Output Summary (Last 24 hours) at 04/29/2019 1240 Last data filed at 04/29/2019 7096 Gross per 24 hour  Intake 823 ml  Output 150 ml  Net 673 ml   LBM: Last BM Date: 04/28/19 Baseline Weight: Weight: 83.9 kg Most recent weight: Weight: 84.5 kg       Palliative Assessment/Data: PPS 40%    Flowsheet Rows     Most Recent Value  Intake Tab  Referral Department  Hospitalist  Unit at Time of Referral  ICU  Palliative Care Primary Diagnosis  Pulmonary  Palliative Care Type  New Palliative care  Reason for referral  Clarify Goals of Care  Date first seen by Palliative Care  04/24/19  Clinical Assessment  Palliative Performance Scale Score  40%  Psychosocial & Spiritual Assessment  Palliative Care Outcomes  Patient/Family meeting held?  Yes  Who was at the meeting?  patient and husband  Palliative Care Outcomes  Clarified goals of care, Counseled regarding hospice, Provided end of life care assistance, Provided psychosocial or spiritual  support, ACP counseling assistance      Patient Active Problem List   Diagnosis Date Noted  . Palliative care by specialist   . Interstitial pulmonary fibrosis ([place])   . Dyspnea and respiratory abnormalities   . Community acquired pneumonia of right lower lobe of lung ([place]) 04/22/2019  . Chronic heart failure with preserved ejection fraction (HFpEF)/dCHF -EF 60 to 65% 04/22/2019  . 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 Stress and adjustment reaction 08/28/2018  . Drug-induced constipation 08/20/2018  . Lipoma of torso 08/20/2018  . Mediastinal adenopathy 08/06/2018  . Hilar adenopathy 08/06/2018  . Tobacco abuse 07/18/2018  . Goals of care, counseling/discussion 07/18/2018  . Chronic respiratory failure with hypoxia due to COPD/ILD-- on 6L/min at [facility] 07/18/2018  . Thrombus of right ventricle without MI 06/26/2018  . Lung mass   . Hypoxemia   . Ground glass opacity present on imaging of lung   . H/o Pulmonary embolism - currently on Eliquis 06/25/2018  . Acute CHF (congestive heart failure) ([place]) 06/25/2018  . Centrilobular emphysema ([place]) 06/25/2018  . History of non-ST elevation myocardial infarction (NSTEMI) 06/25/2018  . Chronic back pain 01/01/2014  . Scoliosis 01/01/2014  . Peripheral neuropathy 01/01/2014    Palliative Care Assessment & Plan   Patient Profile: 63 y.o. female  with past medical history of ILD/IPF on 6L home oxygen, centrilobular emphysema, PE on Eliquis admitted on 04/22/2019 with worsening respiratory symptoms and hypoxia. Followed by [name] for ILD/IPF. Recent visit on 7/28 and CT chest with worsening fibrosis. Patient started on antibiotics, steroids, bronchodilators, and mucolytics with possible right-sided pneumonia. Patient requiring 15L HFNC and BiPAP use. If this is IPF flare-up, Dr.  Ramaswamy suspects very poor prognosis. Palliative medicine consultation for goals of care.   Assessment: Acute on chronic hypoxic respiratory failure IPF  exacerbation Dyspnea Weakness  Recommendations/Plan:  Electronic MOST form completed with patient and husband on 04/28/19. Patient's wishes include: DNR/DNI, limited interventions including CPAP/BiPAP if indicated, IVF/ABX if indicated, and NO feeding tube. Durable DNR completed. Copies made for chart, patient, and family.   Continue current plan of care and medical management per attending.   TOC RN/SW following for disposition. Pending decision for LTACH vs home with hospice. Patient plans to further discuss with husband.   Code Status: DNR/DNI   Code Status Orders  (From admission, onward)         Start     Ordered   04/28/19 1126  Do not attempt resuscitation (DNR)  Continuous    Question Answer Comment  In the event of cardiac or respiratory ARREST Do not call a "code blue"   In the event of cardiac or respiratory ARREST Do not perform Intubation, CPR, defibrillation or ACLS   In the event of cardiac or respiratory ARREST Use medication by any route, position, wound care, and other measures to relive pain and suffering. May use oxygen, suction and manual treatment of airway obstruction as needed for comfort.      04/28/19 1125        Code Status History    Date Active Date Inactive Code Status Order ID Comments User Context   04/22/2019 1717 04/28/2019 1125 Full Code [id number]  Roxan Hockey, MD Inpatient   06/25/2018 0406 06/28/2018 1951 Full Code [id number]  Rise Patience, MD Inpatient   Advance Care Planning Activity       Prognosis:  Poor long-term with acute on chronic respiratory failure with underlying IPF.  Discharge Planning:  To Be Determined  Care plan was discussed with patient, son, RN  Thank you for allowing the Palliative Medicine Team to assist in the care of this patient.   Time In: 1225 Time Out: 1240 Total Time 15 Prolonged Time Billed no      Greater than 50%  of this time was spent counseling and coordinating care related to the  above assessment and plan.  Ihor Dow, DNP, FNP-C Palliative Medicine Team  Phone: [phone] Fax: [phone]  Please contact Palliative Medicine Team phone at [phone] for questions and concerns.

## 2019-04-29 NOTE — Progress Notes (Signed)
Patient Demographics:    Joda Braatz, is a 63 y.o. female, DOB - [birth year], BLT:[id number]  Admit date - 04/22/2019   Admitting Physician [name], MD  Outpatient Primary MD for the patient is [name], DO  LOS - 7  Chief Complaint  Patient presents with  . Shortness of Breath        Subjective:    Alex Sawyer still SOB with minimal exertion and having difficulty speaking in full sentences. Denies CP, nausea, vomiting and fever. Using 12-15L of HFNC. 36-48 hours of only needing BIPAP at night time.   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, manufacturing systems with past medical history relevant for ILD/IPF, centrilobular emphysema, mediastinal adenopathy, pulmonary embolism on chronic anticoagulation with Eliquis, chronic hypoxic respiratory failure on 6 L of oxygen at home admitted on 04/22/2019 with with worsening respiratory symptoms and significant hypoxia--required continuous BiPAP after failing high flow oxygen - Suspected IPF flareup--high mortality risk and very poor short-term prognosis -Unable to discharge home or to a Facility as patient requires 15 L of oxygen frequent BiPAP --If LTAC appropriate...Marland KitchenMarland KitchenMarland Kitchen pulmonologist [name] recommends DNR status. patient waiting to talk to family   A/p 1)Acute on chronic Hypoxic respiratory Failure---  ---  -Procalcitonin is less than 0.1,  -Mild leukocytosis in the setting of steroid use -Afebrile -Troponin is not elevated, BNP 31,  -chest x-ray without volume overload/failure -COVID-19 negative -right-sided pneumonia or CHF much  less likely,  IPF flareup is more likely the culprit for patient's worsening respiratory status --Continues to require  HFNC supplementation (12-15L); and using BIPAP nightly. -Continue IV steroids, nebulizer bronchodilators and mucolytics  -will continue to titrate oxygen down while keeping oxygen saturations greater than 86%, as long as patient is not symptomatic.  She likely has some degree of chronic hypoxia at baseline.  2)Acute COPD exacerbation/underlying ILD--- COPD exacerbation secondary to pneumonia -patient has completed antibiotics for PNA -continue adjusted IV Solu-Medrol, bronchodilators and mucolytics  -BiPAP nightly  -wean oxygen supplementation as tolerated ---Case Discussed with [name] patient's primary pulmonologist (by Dr. Denton Brick)--- if this is a true IPF flareup then mortality is very very high in the short-term (- 90% -3 month mortality and 50% inpatient mortality with median survival 3-13 days)  3)Social/Ethics- -GOC discussed with palliative care and patient has agreed to DNR status. -continue to treat what is treatable and follow clinical response.  4)Chronic anticoagulation due to history of PE- -continue Eliquis  5)Anxiety disorder- -stable mood -no hallucinations or SI -continue Prozac  6)Tobacco Abuse- -nicotine patch as ordered -cessation counseling provided  7)HFpEF--- last known EF 60 to 65%, patient with chronic diastolic dysfunction CHF--- currently appears euvolemic, no CHF exacerbation clinically  and radiologically at this time. -continue daily weights and low sodium diet.   8) steroid-induced hyperglycemia---- Use Novolog/Humalog Sliding scale insulin with Accu-Cheks/Fingersticks as ordered -May improve  after decreasing dose of Solu-Medrol -follow CBG's and adjust hypoglycemic regimen as needed.   Disposition/Need for in-Hospital Stay- patient unable to be discharged at this time due to requirement of HFNC supplementation. -Asked case management to review options of possible LTAC as a bridge to home.  -If patient does not improve in the next several days, may need to reconsider goals and discuss hospice. -no requiring BIPAP during the day for 36 hours now. -will transfer to med-surg bed; wean oxygen as tolerated with intention for 86-88% O 2 sat; continue BIPAP nightly.  Code Status : DNR  Family Communication:   (patient is alert, awake and coherent) -Discussed with patient  Disposition Plan  : -Unable to discharge home or to a Facility at this point, as patient requiring 15 L of oxygen. --If LTAC appropriate...Marland KitchenMarland KitchenMarland Kitchen  Consults  : Phone consult with patient's pulmonologist [name] who is an IPF specialist. Palliative medicine  DVT Prophylaxis  : Eliquis- SCDs   Lab Results  Component Value Date   PLT 388 04/27/2019   Inpatient Medications  Scheduled Meds: . apixaban  5 mg Oral BID  . arformoterol  15 mcg Nebulization BID  . budesonide (PULMICORT) nebulizer solution  0.5 mg Nebulization BID  . Chlorhexidine Gluconate Cloth  6 each Topical Q0600  . feeding supplement (ENSURE ENLIVE)  237 mL Oral BID BM  . FLUoxetine  40 mg Oral Daily  . gabapentin  600 mg Oral QID  . guaiFENesin  600 mg Oral BID  . insulin aspart  0-5 Units Subcutaneous QHS  . insulin aspart  0-9 Units Subcutaneous TID WC  . mouth rinse  15 mL Mouth Rinse BID  . methylPREDNISolone (SOLU-MEDROL) injection  60 mg Intravenous Q8H  . multivitamin with minerals  1 tablet Oral QHS  . Nintedanib  150 mg Oral BID  . sodium chloride flush  3 mL Intravenous Q12H   Continuous Infusions: . sodium chloride     PRN Meds:.sodium chloride, acetaminophen **OR** acetaminophen, albuterol, loperamide, ondansetron  **OR** ondansetron (ZOFRAN) IV, oxyCODONE-acetaminophen, sodium chloride, sodium chloride flush, traZODone    Anti-infectives (From admission, onward)   Start     Dose/Rate Route Frequency Ordered Stop   04/22/19 1415  cefTRIAXone (ROCEPHIN) 1 g in sodium chloride 0.9 % 100 mL IVPB  Status:  Discontinued     1 g 200 mL/hr over 30 Minutes Intravenous Every 24 hours 04/22/19 1413 04/22/19 1417   04/22/19 1415  doxycycline (VIBRA-TABS) tablet 100 mg  Status:  Discontinued     100 mg Oral Every 12 hours 04/22/19 1413 04/29/19 0851   04/22/19 1230  cefTRIAXone (ROCEPHIN) 1 g in sodium chloride 0.9 % 100 mL IVPB  Status:  Discontinued     1 g 200 mL/hr over 30 Minutes Intravenous Every 24 hours 04/22/19 1225 04/29/19 0851   04/22/19 1230  azithromycin (ZITHROMAX) 500 mg in sodium chloride 0.9 % 250 mL IVPB  Status:  Discontinued     500 mg 250 mL/hr over 60 Minutes Intravenous Every 24 hours 04/22/19 1225 04/22/19 1703        Objective:   Vitals:   04/29/19 0600 04/29/19 0700 04/29/19 0750 04/29/19 0800  BP: 128/71 122/77  135/70  Pulse: 81 65 64 64  Resp: 16 15 16  (!) 22  Temp:   98.2 F (36.8 C)  TempSrc:   Oral   SpO2: (!) 88% 92% 96% 96%  Weight:      Height:        Wt Readings from Last 3 Encounters:  04/29/19 84.5 kg  04/08/19 83.9 kg  11/12/18 83.5 kg     Intake/Output Summary (Last 24 hours) at 04/29/2019 1638 Last data filed at 04/29/2019 4665 Gross per 24 hour  Intake 923 ml  Output 150 ml  Net 773 ml     Physical Exam General exam: Alert, awake, oriented x 3; wearing HFNC 12-15 L currently; denies CP, fever and productive cough. Still easily winded with minimal activity. Respiratory system: diffuse rhonchi, no wheezing, no crackles. Positive intermittent tachypnea and inability to speak in full sentences.  Cardiovascular system:RRR. No murmurs, rubs, gallops. Gastrointestinal system: Abdomen is nondistended, soft and nontender. No organomegaly or masses  felt. Normal bowel sounds heard. Central nervous system: Alert and oriented. No focal neurological deficits. Extremities: No C/C/E, +pedal pulses Skin: No rashes, lesions or ulcers Psychiatry: Judgement and insight appear normal. Mood & affect appropriate.       Data Review:   Micro Results Recent Results (from the past 240 hour(s))  SARS Coronavirus 2 [facility] order, Performed in [facility] hospital lab) Nasopharyngeal Nasopharyngeal Swab     Status: None   Collection Time: 04/22/19 11:46 AM   Specimen: Nasopharyngeal Swab  Result Value Ref Range Status   SARS Coronavirus 2 NEGATIVE NEGATIVE Final    Comment: (NOTE) If result is NEGATIVE SARS-CoV-2 target nucleic acids are NOT DETECTED. The SARS-CoV-2 RNA is generally detectable in upper and lower  respiratory specimens during the acute phase of infection. The lowest  concentration of SARS-CoV-2 viral copies this assay can detect is 250  copies / mL. A negative result does not preclude SARS-CoV-2 infection  and should not be used as the sole basis for treatment or other  patient management decisions.  A negative result may occur with  improper specimen collection / handling, submission of specimen other  than nasopharyngeal swab, presence of viral mutation(s) within the  areas targeted by this assay, and inadequate number of viral copies  (<250 copies / mL). A negative result must be combined with clinical  observations, patient history, and epidemiological information. If result is POSITIVE SARS-CoV-2 target nucleic acids are DETECTED. The SARS-CoV-2 RNA is generally detectable in upper and lower  respiratory specimens dur ing the acute phase of infection.  Positive  results are indicative of active infection with SARS-CoV-2.  Clinical  correlation with patient history and other diagnostic information is  necessary to determine patient infection status.  Positive results do  not rule out bacterial infection or co-infection  with other viruses. If result is PRESUMPTIVE POSTIVE SARS-CoV-2 nucleic acids MAY BE PRESENT.   A presumptive positive result was obtained on the submitted specimen  and confirmed on repeat testing.  While 2019 novel coronavirus  (SARS-CoV-2) nucleic acids may be present in the submitted sample  additional confirmatory testing may be necessary for epidemiological  and / or clinical management purposes  to differentiate between  SARS-CoV-2 and other Sarbecovirus currently known to infect humans.  If clinically indicated additional testing with an alternate test  methodology [phone]) is advised. The SARS-CoV-2 RNA is generally  detectable in upper and lower respiratory sp ecimens during the acute  phase of infection. The expected result is Negative. Fact Sheet for Patients:  StrictlyIdeas.no Fact Sheet for Healthcare Providers: BankingDealers.co.za This test is not yet approved or cleared by  the [employer] and has been authorized for detection and/or diagnosis of SARS-CoV-2 by FDA under an Emergency Use Authorization (EUA).  This EUA will remain in effect (meaning this test can be used) for the duration of the COVID-19 declaration under Section 564(b)(1) of the Act, 21 U.S.C. section 360bbb-3(b)(1), unless the authorization is terminated or revoked sooner. Performed at [facility], [address] [place], [place] [address]   MRSA PCR Screening     Status: None   Collection Time: 04/22/19  6:12 PM   Specimen: Nasopharyngeal  Result Value Ref Range Status   MRSA by PCR NEGATIVE NEGATIVE Final    Comment:        The GeneXpert MRSA Assay (FDA approved for NASAL specimens only), is one component of a comprehensive MRSA colonization surveillance program. It is not intended to diagnose MRSA infection nor to guide or monitor treatment for MRSA infections. Performed at [facility], [address], [place], [place] [address]      Radiology Reports Dg Chest [place] 1 View  Result Date: 04/25/2019 CLINICAL DATA:  Patient with trouble breathing for several days. EXAM: PORTABLE CHEST 1 VIEW COMPARISON:  April 22, 2019 FINDINGS: The patient has chronic interstitial lung disease. More focal opacity is seen in the right base than elsewhere which is new since April 14, 2019. No other interval changes. IMPRESSION: 1. Findings are chronic interstitial lung disease. 2. Persistent infiltrate in the right base worrisome for pneumonia or aspiration. Recommend follow-up to resolution. Electronically Signed   By: Dorise Bullion III M.D   On: 04/25/2019 09:37   Dg Chest Portable 1 View  Result Date: 04/22/2019 CLINICAL DATA:  Shortness of breath EXAM: PORTABLE CHEST 1 VIEW COMPARISON:  04/14/2019 FINDINGS: Cardiac silhouette is stable, appearing mildly enlarged. Diffuse chronic interstitial lung changes, with increasing interstitial opacity. More focal airspace consolidation in the right lower lobe. No pneumothorax. IMPRESSION: 1. Right lower lobe airspace consolidation suspicious for pneumonia in the appropriate clinical setting. 2. Diffuse chronic interstitial changes with suggestion of superimposed interstitial edema. Electronically Signed   By: Davina Poke M.D.   On: 04/22/2019 11:33     CBC Recent Labs  Lab 04/22/19 1229 04/23/19 0630 04/27/19 0452  WBC 11.6* 12.1* 14.5*  HGB 13.4 13.4 12.5  HCT 42.1 41.9 40.1  PLT 326 354 388  MCV 92.3 91.7 94.6  MCH 29.4 29.3 29.5  MCHC 31.8 32.0 31.2  RDW 12.6 12.4 12.3  LYMPHSABS 1.7  --   --   MONOABS 0.5  --   --   EOSABS 0.2  --   --   BASOSABS 0.0  --   --     Chemistries  Recent Labs  Lab 04/22/19 1229 04/23/19 0630 04/25/19 0514 04/27/19 0452  NA 139 139 139 139  K 3.5 3.9 4.1 4.1  CL 101 102 104 103  CO2 27 25 24 31   GLUCOSE 91 132* 151* 179*  BUN 18 18 25* 27*  CREATININE 0.72 0.50 0.54 0.58  CALCIUM 8.5* 9.1 8.8* 8.7*  AST 22  --   --   --   ALT 18  --   --    --   ALKPHOS 28*  --   --   --   BILITOT 1.1  --   --   --     Cardiac Enzymes No results for input(s): CKMB, TROPONINI, MYOGLOBIN in the last 168 hours.  Invalid input(s): CK ------------------------------------------------------------------------------------------------------------------    Component Value Date/Time  BNP 31.0 04/22/2019 1229   Barton Dubois M.D  [phone]  on 04/29/2019 at 9:22 AM  Go to [email or web address] - for contact info  [facility] - Office  [phone]

## 2019-04-29 NOTE — Progress Notes (Signed)
Nutrition Follow-up  DOCUMENTATION CODES:      INTERVENTION:  Continue Ensure BID Continue MVI Education on small frequent meals   NUTRITION DIAGNOSIS:   Increased nutrient needs related to acute illness, chronic illness(COPD-Empysema, pulmonary fibrosis, PNA) as evidenced by per patient/family report, meal completion < 50%.  Progressing; pt reported 80-100% of meals  GOAL:   Provide needs based on ASPEN/SCCM guidelines    MONITOR:   PO intake, Supplement acceptance, Labs, Weight trends, I & O's  REASON FOR ASSESSMENT:   Malnutrition Screening Tool    ASSESSMENT:   Patient is a 63 yo female with hx of COPD (emphysema) and pulmonary fibrosis,HF (EF-60-65%), NSTEMI, and tobacco smoker. She presents with right sided pneumonia, acute COPD exacerbation.  Patient sitting up in bed with son in room at time of visit and reports feeling pretty good today. Patient reports improvements to appetite and po and stated "appetite is maybe a little too good". She recalls 100% of pancakes with no syrup, oatmeal, eggs and coffee for breakfast this morning. PTA patient endorses 1 week of decreased appetite and intake and stated "I just didn't feel good and didn't want to eat anything"   RD encouraged 4-5 small frequent meals vs 3 large meals; suggested daily intake of ONS as an easy way to provide calories and protein when feeling tired or experiencing SOB. Patient reports that she enjoys the chocolate flavor.   Current wt 84.5 kg (185.9 lb) UBW 185 lb  Wt history reviewed; patient noted with 8.14 lb wt gain over the past 7 months - 4.6%; insignificant for time frame  Diet Order:   Diet Order            Diet Heart Room service appropriate? Yes; Fluid consistency: Thin  Diet effective now              EDUCATION NEEDS:   No education needs have been identified at this time  Skin:  Skin Assessment: Reviewed RN Assessment(dry and intact)  Last BM:  8/8  Height:   Ht Readings  from Last 1 Encounters:  04/22/19 5\' 3"  (1.6 m)    Weight:   Wt Readings from Last 1 Encounters:  04/29/19 84.5 kg    Ideal Body Weight:  52 kg  BMI:  Body mass index is 33 kg/m.  Estimated Nutritional Needs:   Kcal:  1950-2150  Protein:  98-108  Fluid:  >1.9L/day   Lajuan Lines, RD, [place] [phone] After Hours/Weekend Pager: [phone]

## 2019-04-30 LAB — CBC
HCT: 40.4 % (ref 36.0–46.0)
Hemoglobin: 12.8 g/dL (ref 12.0–15.0)
MCH: 29.7 pg (ref 26.0–34.0)
MCHC: 31.7 g/dL (ref 30.0–36.0)
MCV: 93.7 fL (ref 80.0–100.0)
Platelets: 371 10*3/uL (ref 150–400)
RBC: 4.31 MIL/uL (ref 3.87–5.11)
RDW: 12.4 % (ref 11.5–15.5)
WBC: 18 10*3/uL — ABNORMAL HIGH (ref 4.0–10.5)
nRBC: 0 % (ref 0.0–0.2)

## 2019-04-30 LAB — BASIC METABOLIC PANEL
Anion gap: 9 (ref 5–15)
BUN: 20 mg/dL (ref 8–23)
CO2: 27 mmol/L (ref 22–32)
Calcium: 8.2 mg/dL — ABNORMAL LOW (ref 8.9–10.3)
Chloride: 100 mmol/L (ref 98–111)
Creatinine, Ser: 0.66 mg/dL (ref 0.44–1.00)
GFR calc Af Amer: >60 mL/min (ref >60)
GFR calc non Af Amer: >60 mL/min (ref >60)
Glucose, Bld: 172 mg/dL — ABNORMAL HIGH (ref 70–99)
Potassium: 4.5 mmol/L (ref 3.5–5.1)
Sodium: 136 mmol/L (ref 135–145)

## 2019-04-30 LAB — GLUCOSE, CAPILLARY
Glucose-Capillary: 166 mg/dL — ABNORMAL HIGH (ref 70–99)
Glucose-Capillary: 199 mg/dL — ABNORMAL HIGH (ref 70–99)
Glucose-Capillary: 222 mg/dL — ABNORMAL HIGH (ref 70–99)
Glucose-Capillary: 202 mg/dL — ABNORMAL HIGH (ref 70–99)

## 2019-04-30 NOTE — Care Management Important Message (Signed)
Important Message  Patient Details  Name: Dawn Nichols MRN: [id number] Date of Birth: [birth year]   Medicare Important Message Given:  Yes     Tommy Medal 04/30/2019, 2:16 PM

## 2019-04-30 NOTE — Progress Notes (Signed)
Patient Demographics:    Dawn Nichols, is a 63 y.o. female, DOB - 16-Feb-1956, [id number]  Admit date - 04/22/2019   Admitting Physician [name], MD  Outpatient Primary MD for the patient is [name], DO  LOS - 8  Chief Complaint  Patient presents with  . Shortness of Breath        Subjective:    Dawn Nichols reports shortness of breath on minimal exertion and is still having oxygen desaturation with activities.  No chest pain, no nausea, no vomiting, no fever.  Using 7 L high flow nasal cannula supplementation and BiPAP nightly.     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, manufacturing systems with past medical history relevant for ILD/IPF, centrilobular emphysema, mediastinal adenopathy, pulmonary embolism on chronic anticoagulation with Eliquis, chronic hypoxic respiratory failure on 6 L of oxygen at home admitted on 04/22/2019 with with worsening respiratory symptoms and significant hypoxia--required continuous BiPAP after failing high flow oxygen - Suspected IPF flareup--high mortality risk and very poor short-term prognosis -Unable to discharge home or to a Facility as patient requires 15 L of oxygen frequent BiPAP --If LTAC appropriate...Marland KitchenMarland KitchenMarland Kitchen pulmonologist [name] recommends DNR status. patient waiting to talk to family   A/p 1)Acute on chronic Hypoxic  respiratory Failure---  --- -Procalcitonin is less than 0.1,  -Mild leukocytosis in the setting of steroid use -Afebrile -Troponin is not elevated, BNP 31,  -chest x-ray without volume overload/failure -COVID-19 negative -right-sided pneumonia or CHF much  less likely,  IPF flareup is more likely the culprit for patient's worsening respiratory status -Continues to require  HFNC supplementation (7L); and using BIPAP nightly. -Continue IV steroids, nebulizer bronchodilators and mucolytics  -will continue to titrate oxygen down while keeping oxygen saturations greater than 86-88%, as long as patient is not symptomatic.  She likely has some degree of chronic hypoxia at baseline.  2)Acute COPD exacerbation/underlying ILD--- COPD exacerbation secondary to pneumonia -patient has completed antibiotics for PNA -continue adjusted IV Solu-Medrol, bronchodilators and mucolytics  -BiPAP nightly  -wean oxygen supplementation as tolerated ---Case Discussed with [name] patient's primary pulmonologist (by Dr. Denton Brick)--- if this is a true IPF flareup then mortality is very very high in the short-term (- 90% -3 month mortality and 50% inpatient mortality with median survival 3-13 days)  3)Social/Ethics- -GOC discussed with palliative care and patient has agreed to DNR status. -continue to treat what is treatable and follow clinical response.  4)Chronic anticoagulation due to history of PE- -continue Eliquis  5)Anxiety disorder- -stable mood -no hallucinations or SI -continue Prozac  6)Tobacco Abuse- -nicotine patch as ordered -cessation counseling provided  7)HFpEF--- last known EF 60 to 65%, patient with chronic diastolic dysfunction  CHF--- currently appears euvolemic, no CHF exacerbation clinically and radiologically at this time. -continue daily weights and low sodium diet.   8) steroid-induced hyperglycemia---- Use Novolog/Humalog Sliding scale insulin with  Accu-Cheks/Fingersticks as ordered -May improve after decreasing dose of Solu-Medrol -follow CBG's and adjust hypoglycemic regimen as needed.   Disposition/Need for in-Hospital Stay- patient unable to be discharged at this time due to requirement of HFNC supplementation. -Asked case management to review options of possible LTAC as a bridge to home.  -If patient does not improve in the next several days, may need to reconsider goals and discuss hospice. -no requiring BIPAP during the day for 48 hours now. -will transfer to med-surg bed; wean oxygen as tolerated with intention for 86-88% O 2 sat; continue BIPAP nightly.  Code Status : DNR  Family Communication:   (patient is alert, awake and coherent) -Discussed with patient  Disposition Plan  : -Unable to discharge home or to a Facility at this point, as patient requiring 15 L of oxygen. --If LTAC appropriate...Marland KitchenMarland KitchenMarland Kitchen  Consults  : Phone consult with patient's pulmonologist [name] who is an IPF specialist. Palliative medicine  DVT Prophylaxis  : Eliquis- SCDs   Lab Results  Component Value Date   PLT 371 04/30/2019   Inpatient Medications  Scheduled Meds: . apixaban  5 mg Oral BID  . arformoterol  15 mcg Nebulization BID  . budesonide (PULMICORT) nebulizer solution  0.5 mg Nebulization BID  . feeding supplement (ENSURE ENLIVE)  237 mL Oral BID BM  . FLUoxetine  40 mg Oral Daily  . gabapentin  600 mg Oral QID  . guaiFENesin  600 mg Oral BID  . insulin aspart  0-5 Units Subcutaneous QHS  . insulin aspart  0-9 Units Subcutaneous TID WC  . mouth rinse  15 mL Mouth Rinse BID  . methylPREDNISolone (SOLU-MEDROL) injection  60 mg Intravenous Q8H  . multivitamin with minerals  1 tablet Oral QHS  . Nintedanib  150 mg Oral BID  . pantoprazole  40 mg Oral Daily  . sodium chloride flush  3 mL Intravenous Q12H   Continuous Infusions: . sodium chloride     PRN Meds:.sodium chloride, acetaminophen **OR** acetaminophen, albuterol,  loperamide, ondansetron **OR** ondansetron (ZOFRAN) IV, oxyCODONE-acetaminophen, sodium chloride, sodium chloride flush, traZODone    Anti-infectives (From admission, onward)   Start     Dose/Rate Route Frequency Ordered Stop   04/22/19 1415  cefTRIAXone (ROCEPHIN) 1 g in sodium chloride 0.9 % 100 mL IVPB  Status:  Discontinued     1 g 200 mL/hr over 30 Minutes Intravenous Every 24 hours 04/22/19 1413 04/22/19 1417   04/22/19 1415  doxycycline (VIBRA-TABS) tablet 100 mg  Status:  Discontinued     100 mg Oral Every 12 hours 04/22/19 1413 04/29/19 0851   04/22/19 1230  cefTRIAXone (ROCEPHIN) 1 g in sodium chloride 0.9 % 100 mL IVPB  Status:  Discontinued     1 g 200 mL/hr over 30 Minutes Intravenous Every 24 hours 04/22/19 1225 04/29/19 0851   04/22/19 1230  azithromycin (ZITHROMAX) 500 mg in sodium chloride 0.9 % 250 mL IVPB  Status:  Discontinued     500 mg 250 mL/hr over 60 Minutes Intravenous Every 24 hours 04/22/19 1225 04/22/19 1703        Objective:   Vitals:   04/30/19 0809 04/30/19 0814 04/30/19 1131 04/30/19 1400  BP:    135/87  Pulse:    84  Resp:    18  Temp:  98.2 F (36.8 C) 97.8 F (36.6 C)  TempSrc:   Oral Oral  SpO2: 93% 91%  92%  Weight:      Height:        Wt Readings from Last 3 Encounters:  04/30/19 84.3 kg  04/08/19 83.9 kg  11/12/18 83.5 kg     Intake/Output Summary (Last 24 hours) at 04/30/2019 1620 Last data filed at 04/30/2019 1300 Gross per 24 hour  Intake 3 ml  Output 1050 ml  Net -1047 ml     Physical Exam General exam: Alert, awake, oriented x 3; denies chest pain, no fever; still short of breath with exertion but feeling better overall.  No BiPAP usage during the day and down to 7L HFNC Respiratory system: Diffuse rhonchi right, fair air movement; no using accessory muscles.  Positive tachypnea with minimal activity.   Cardiovascular system:RRR. No murmurs, rubs, gallops. Gastrointestinal system: Abdomen is nondistended, soft and  nontender. No organomegaly or masses felt. Normal bowel sounds heard. Central nervous system: Alert and oriented. No focal neurological deficits. Extremities: No C/C/E, +pedal pulses Skin: No rashes, lesions or ulcers Psychiatry: Judgement and insight appear normal. Mood & affect appropriate.     Data Review:   Micro Results Recent Results (from the past 240 hour(s))  SARS Coronavirus 2 [facility] order, Performed in [facility] hospital lab) Nasopharyngeal Nasopharyngeal Swab     Status: None   Collection Time: 04/22/19 11:46 AM   Specimen: Nasopharyngeal Swab  Result Value Ref Range Status   SARS Coronavirus 2 NEGATIVE NEGATIVE Final    Comment: (NOTE) If result is NEGATIVE SARS-CoV-2 target nucleic acids are NOT DETECTED. The SARS-CoV-2 RNA is generally detectable in upper and lower  respiratory specimens during the acute phase of infection. The lowest  concentration of SARS-CoV-2 viral copies this assay can detect is 250  copies / mL. A negative result does not preclude SARS-CoV-2 infection  and should not be used as the sole basis for treatment or other  patient management decisions.  A negative result may occur with  improper specimen collection / handling, submission of specimen other  than nasopharyngeal swab, presence of viral mutation(s) within the  areas targeted by this assay, and inadequate number of viral copies  (<250 copies / mL). A negative result must be combined with clinical  observations, patient history, and epidemiological information. If result is POSITIVE SARS-CoV-2 target nucleic acids are DETECTED. The SARS-CoV-2 RNA is generally detectable in upper and lower  respiratory specimens dur ing the acute phase of infection.  Positive  results are indicative of active infection with SARS-CoV-2.  Clinical  correlation with patient history and other diagnostic information is  necessary to determine patient infection status.  Positive results do  not rule out  bacterial infection or co-infection with other viruses. If result is PRESUMPTIVE POSTIVE SARS-CoV-2 nucleic acids MAY BE PRESENT.   A presumptive positive result was obtained on the submitted specimen  and confirmed on repeat testing.  While 2019 novel coronavirus  (SARS-CoV-2) nucleic acids may be present in the submitted sample  additional confirmatory testing may be necessary for epidemiological  and / or clinical management purposes  to differentiate between  SARS-CoV-2 and other Sarbecovirus currently known to infect humans.  If clinically indicated additional testing with an alternate test  methodology [phone]) is advised. The SARS-CoV-2 RNA is generally  detectable in upper and lower respiratory sp ecimens during the acute  phase of infection. The expected result is Negative. Fact Sheet for Patients:  StrictlyIdeas.no Fact Sheet for Healthcare Providers: BankingDealers.co.za This test is not yet approved or cleared by the [place] FDA and has been authorized for detection and/or diagnosis of SARS-CoV-2 by FDA under an Emergency Use Authorization (EUA).  This EUA will remain in effect (meaning this test can be used) for the duration of the COVID-19 declaration under Section 564(b)(1) of the Act, 21 U.S.C. section 360bbb-3(b)(1), unless the authorization is terminated or revoked sooner. Performed at [facility], [address] [place], [place] [address]   MRSA PCR Screening     Status: None   Collection Time: 04/22/19  6:12 PM   Specimen: Nasopharyngeal  Result Value Ref Range Status   MRSA by PCR NEGATIVE NEGATIVE Final    Comment:        The GeneXpert MRSA Assay (FDA approved for NASAL specimens only), is one component of a comprehensive MRSA colonization surveillance program. It is not intended to diagnose MRSA infection nor to guide or monitor treatment for MRSA infections. Performed at [facility], [address], [place], [place] [address]     Radiology Reports Dg Chest [place] 1 View  Result Date: 04/25/2019 CLINICAL DATA:  Patient with trouble breathing for several days. EXAM: PORTABLE CHEST 1 VIEW COMPARISON:  April 22, 2019 FINDINGS: The patient has chronic interstitial lung disease. More focal opacity is seen in the right base than elsewhere which is new since April 14, 2019. No other interval changes. IMPRESSION: 1. Findings are chronic interstitial lung disease. 2. Persistent infiltrate in the right base worrisome for pneumonia or aspiration. Recommend follow-up to resolution. Electronically Signed   By: Dorise Bullion III M.D   On: 04/25/2019 09:37   Dg Chest Portable 1 View  Result Date: 04/22/2019 CLINICAL DATA:  Shortness of breath EXAM: PORTABLE CHEST 1 VIEW COMPARISON:  04/14/2019 FINDINGS: Cardiac silhouette is stable, appearing mildly enlarged. Diffuse chronic interstitial lung changes, with increasing interstitial opacity. More focal airspace consolidation in the right lower lobe. No pneumothorax. IMPRESSION: 1. Right lower lobe airspace consolidation suspicious for pneumonia in the appropriate clinical setting. 2. Diffuse chronic interstitial changes with suggestion of superimposed interstitial edema. Electronically Signed   By: Davina Poke M.D.   On: 04/22/2019 11:33     CBC Recent Labs  Lab 04/27/19 0452 04/30/19 0425  WBC 14.5* 18.0*  HGB 12.5 12.8  HCT 40.1 40.4  PLT 388 371  MCV 94.6 93.7  MCH 29.5 29.7  MCHC 31.2 31.7  RDW 12.3 12.4    Chemistries  Recent Labs  Lab 04/25/19 0514 04/27/19 0452 04/30/19 0425  NA 139 139 136  K 4.1 4.1 4.5  CL 104 103 100  CO2 24 31 27   GLUCOSE 151* 179* 172*  BUN 25* 27* 20  CREATININE 0.54 0.58 0.66  CALCIUM 8.8* 8.7* 8.2*    Cardiac Enzymes No results for input(s): CKMB, TROPONINI, MYOGLOBIN in the last 168 hours.  Invalid input(s): CK  ------------------------------------------------------------------------------------------------------------------    Component Value Date/Time   BNP 31.0 04/22/2019 1229   Barton Dubois M.D  [phone]  on 04/30/2019 at 4:20 PM  Go to [email or web address] - for contact info  [facility] - Office  [phone]

## 2019-04-30 NOTE — Progress Notes (Signed)
Patient wants to try sleeping without BIPAP tonight and just staying on HFNC. Will monitor through the night.

## 2019-05-01 LAB — GLUCOSE, CAPILLARY
Glucose-Capillary: 158 mg/dL — ABNORMAL HIGH (ref 70–99)
Glucose-Capillary: 270 mg/dL — ABNORMAL HIGH (ref 70–99)
Glucose-Capillary: 275 mg/dL — ABNORMAL HIGH (ref 70–99)
Glucose-Capillary: 160 mg/dL — ABNORMAL HIGH (ref 70–99)

## 2019-05-01 MED ORDER — MORPHINE SULFATE (CONCENTRATE) 10 MG/0.5ML PO SOLN
5.0000 mg | ORAL | Status: DC | PRN
  Administered 2019-05-03 (×3): 5 mg via ORAL
  Filled 2019-05-01 (×2): qty 0.5

## 2019-05-01 NOTE — Progress Notes (Signed)
Patient Demographics:    Dawn Nichols, is a 63 y.o. female, DOB - 08-08-1956, [id number]  Admit date - 04/22/2019   Admitting Physician [name], MD  Outpatient Primary MD for the patient is Dawn Norlander, DO  LOS - 9  Chief Complaint  Patient presents with  . Shortness of Breath        Subjective:    [name] continues to report shortness of breath with minimal exertion.  Denies chest pain, no nausea, no vomiting.  Went overnight without the use of BiPAP and tolerating well saturations while on high flow nasal cannula 7 L.   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, manufacturing systems with past medical history relevant for ILD/IPF, centrilobular emphysema, mediastinal adenopathy, pulmonary embolism on chronic anticoagulation with Eliquis, chronic hypoxic respiratory failure on 6 L of oxygen at home admitted on 04/22/2019 with with worsening respiratory symptoms and significant hypoxia--required continuous BiPAP after failing high flow oxygen - Suspected IPF flareup--high mortality risk and very poor short-term prognosis -Unable to discharge home or to a Facility as patient requires 15 L of oxygen frequent BiPAP --If LTAC appropriate...Marland KitchenMarland KitchenMarland Kitchen pulmonologist [name] recommends DNR status. patient waiting to talk to family   A/p 1)Acute on chronic Hypoxic  respiratory Failure---  --- -Procalcitonin is less than 0.1,  -Mild leukocytosis in the setting of steroid use -Afebrile -Troponin is not elevated, BNP 31,  -chest x-ray without volume overload/failure -COVID-19 negative -right-sided pneumonia or CHF much  less likely,  IPF flareup is more likely the culprit for patient's worsening respiratory status -Continues to require  HFNC supplementation (7L); and using BIPAP nightly. -Continue IV steroids, nebulizer bronchodilators and mucolytics  -will continue to titrate oxygen down while keeping oxygen saturations greater than 86-88%, as long as patient is not symptomatic.  She likely has some degree of chronic hypoxia at baseline.  2)Acute COPD exacerbation/underlying ILD--- COPD exacerbation secondary to pneumonia -patient has completed antibiotics for PNA -continue adjusted IV Solu-Medrol, bronchodilators and mucolytics  -BiPAP nightly  -wean oxygen supplementation as tolerated ---Case Discussed with [name] patient's primary pulmonologist (by Dr. Denton Brick)--- if this is a true IPF flareup then mortality is very very high in the short-term (- 90% -3 month mortality and 50% inpatient mortality with median survival 3-13 days)  3)Social/Ethics- -GOC discussed with palliative care and patient has agreed to DNR status. -continue to treat what is treatable and follow clinical response.  4)Chronic anticoagulation due to history of PE- -continue Eliquis  5)Anxiety disorder- -stable mood -no hallucinations or SI -continue Prozac  6)Tobacco Abuse- -nicotine patch as ordered -cessation counseling provided  7)HFpEF--- last known EF 60 to 65%, patient with chronic diastolic dysfunction CHF--- currently  appears euvolemic, no CHF exacerbation clinically and radiologically at this time. -continue daily weights and low sodium diet.   8) steroid-induced hyperglycemia---- Use Novolog/Humalog Sliding scale insulin with  Accu-Cheks/Fingersticks as ordered -May improve after decreasing dose of Solu-Medrol -follow CBG's and adjust hypoglycemic regimen as needed.   Disposition/Need for in-Hospital Stay- patient unable to be discharged at this time due to requirement of HFNC supplementation. -Asked case management to review options of possible LTAC as a bridge to home.  -If patient does not improve in the next several days, may need to reconsider goals and discuss hospice. -no requiring BIPAP during the day for 72 hours now and tolerated one night with use of BIPAP. -will transfer to med-surg bed; wean oxygen as tolerated with intention for 86-88% O 2 sat; continue BIPAP nightly PRN. -When using BiPAP she tolerates well while in the hospital.  In the next 24 hours we will repeat ABG to assess CO2 levels.  Code Status : DNR  Family Communication:   (patient is alert, awake and coherent) -Discussed with patient  Disposition Plan  : -Unable to discharge home or to a Facility at this point, as patient requiring 15 L of oxygen. --If LTAC appropriate...Marland KitchenMarland KitchenMarland Kitchen  Consults  : Phone consult with patient's pulmonologist [name] who is an IPF specialist. Palliative medicine  DVT Prophylaxis  : Eliquis- SCDs   Lab Results  Component Value Date   PLT 371 04/30/2019   Inpatient Medications  Scheduled Meds: . apixaban  5 mg Oral BID  . arformoterol  15 mcg Nebulization BID  . budesonide (PULMICORT) nebulizer solution  0.5 mg Nebulization BID  . feeding supplement (ENSURE ENLIVE)  237 mL Oral BID BM  . FLUoxetine  40 mg Oral Daily  . gabapentin  600 mg Oral QID  . guaiFENesin  600 mg Oral BID  . insulin aspart  0-5 Units Subcutaneous QHS  . insulin aspart  0-9 Units Subcutaneous TID WC  . mouth rinse  15 mL Mouth Rinse BID  . methylPREDNISolone (SOLU-MEDROL) injection  60 mg Intravenous Q8H  . multivitamin with minerals  1 tablet Oral QHS  . Nintedanib  150 mg Oral BID  . pantoprazole  40 mg Oral Daily  .  sodium chloride flush  3 mL Intravenous Q12H   Continuous Infusions: . sodium chloride     PRN Meds:.sodium chloride, acetaminophen **OR** acetaminophen, albuterol, loperamide, morphine CONCENTRATE, ondansetron **OR** ondansetron (ZOFRAN) IV, oxyCODONE-acetaminophen, sodium chloride, sodium chloride flush, traZODone    Anti-infectives (From admission, onward)   Start     Dose/Rate Route Frequency Ordered Stop   04/22/19 1415  cefTRIAXone (ROCEPHIN) 1 g in sodium chloride 0.9 % 100 mL IVPB  Status:  Discontinued     1 g 200 mL/hr over 30 Minutes Intravenous Every 24 hours 04/22/19 1413 04/22/19 1417   04/22/19 1415  doxycycline (VIBRA-TABS) tablet 100 mg  Status:  Discontinued     100 mg Oral Every 12 hours 04/22/19 1413 04/29/19 0851   04/22/19 1230  cefTRIAXone (ROCEPHIN) 1 g in sodium chloride 0.9 % 100 mL IVPB  Status:  Discontinued     1 g 200 mL/hr over 30 Minutes Intravenous Every 24 hours 04/22/19 1225 04/29/19 0851   04/22/19 1230  azithromycin (ZITHROMAX) 500 mg in sodium chloride 0.9 % 250 mL IVPB  Status:  Discontinued     500 mg 250 mL/hr over 60 Minutes Intravenous Every 24 hours 04/22/19 1225 04/22/19 1703        Objective:  Vitals:   05/01/19 0545 05/01/19 0752 05/01/19 0759 05/01/19 1417  BP: (!) 139/97   (!) 172/86  Pulse: 80   (!) 103  Resp: (!) 21   (!) 22  Temp: 97.8 F (36.6 C)   98.1 F (36.7 C)  TempSrc: Oral   Oral  SpO2: 94% 94% 98% 92%  Weight:      Height:        Wt Readings from Last 3 Encounters:  05/01/19 82.3 kg  04/08/19 83.9 kg  11/12/18 83.5 kg     Intake/Output Summary (Last 24 hours) at 05/01/2019 1729 Last data filed at 05/01/2019 1300 Gross per 24 hour  Intake 720 ml  Output 1300 ml  Net -580 ml     Physical Exam General exam: Alert, awake, oriented x 3; no chest pain, no fevers, no nausea, no vomiting.  Still requiring 7 L high flow nasal cannula supplementation, did not require the use of BiPAP overnight. Respiratory  system: Scattered rhonchi, no wheezing, fair air movement. .  Cardiovascular system:RRR. No murmurs, rubs, gallops. Gastrointestinal system: Abdomen is nondistended, soft and nontender. No organomegaly or masses felt. Normal bowel sounds heard. Central nervous system: Alert and oriented. No focal neurological deficits. Extremities: No C/C/E, +pedal pulses Skin: No rashes, lesions or ulcers Psychiatry: Judgement and insight appear normal. Mood & affect appropriate.      Data Review:   Micro Results Recent Results (from the past 240 hour(s))  SARS Coronavirus 2 [facility] order, Performed in [facility] hospital lab) Nasopharyngeal Nasopharyngeal Swab     Status: None   Collection Time: 04/22/19 11:46 AM   Specimen: Nasopharyngeal Swab  Result Value Ref Range Status   SARS Coronavirus 2 NEGATIVE NEGATIVE Final    Comment: (NOTE) If result is NEGATIVE SARS-CoV-2 target nucleic acids are NOT DETECTED. The SARS-CoV-2 RNA is generally detectable in upper and lower  respiratory specimens during the acute phase of infection. The lowest  concentration of SARS-CoV-2 viral copies this assay can detect is 250  copies / mL. A negative result does not preclude SARS-CoV-2 infection  and should not be used as the sole basis for treatment or other  patient management decisions.  A negative result may occur with  improper specimen collection / handling, submission of specimen other  than nasopharyngeal swab, presence of viral mutation(s) within the  areas targeted by this assay, and inadequate number of viral copies  (<250 copies / mL). A negative result must be combined with clinical  observations, patient history, and epidemiological information. If result is POSITIVE SARS-CoV-2 target nucleic acids are DETECTED. The SARS-CoV-2 RNA is generally detectable in upper and lower  respiratory specimens dur ing the acute phase of infection.  Positive  results are indicative of active infection with  SARS-CoV-2.  Clinical  correlation with patient history and other diagnostic information is  necessary to determine patient infection status.  Positive results do  not rule out bacterial infection or co-infection with other viruses. If result is PRESUMPTIVE POSTIVE SARS-CoV-2 nucleic acids MAY BE PRESENT.   A presumptive positive result was obtained on the submitted specimen  and confirmed on repeat testing.  While 2019 novel coronavirus  (SARS-CoV-2) nucleic acids may be present in the submitted sample  additional confirmatory testing may be necessary for epidemiological  and / or clinical management purposes  to differentiate between  SARS-CoV-2 and other Sarbecovirus currently known to infect humans.  If clinically indicated additional testing with an alternate test  methodology [phone]) is advised.  The SARS-CoV-2 RNA is generally  detectable in upper and lower respiratory sp ecimens during the acute  phase of infection. The expected result is Negative. Fact Sheet for Patients:  StrictlyIdeas.no Fact Sheet for Healthcare Providers: BankingDealers.co.za This test is not yet approved or cleared by the [place] FDA and has been authorized for detection and/or diagnosis of SARS-CoV-2 by FDA under an Emergency Use Authorization (EUA).  This EUA will remain in effect (meaning this test can be used) for the duration of the COVID-19 declaration under Section 564(b)(1) of the Act, 21 U.S.C. section 360bbb-3(b)(1), unless the authorization is terminated or revoked sooner. Performed at [facility], [address], [place], [place] [address]   MRSA PCR Screening     Status: None   Collection Time: 04/22/19  6:12 PM   Specimen: Nasopharyngeal  Result Value Ref Range Status   MRSA by PCR NEGATIVE NEGATIVE Final    Comment:        The GeneXpert MRSA Assay (FDA approved for NASAL specimens only), is one component of a comprehensive MRSA  colonization surveillance program. It is not intended to diagnose MRSA infection nor to guide or monitor treatment for MRSA infections. Performed at [facility], [address] [place], [place] [address]     Radiology Reports Dg Chest Baring 1 View  Result Date: 04/25/2019 CLINICAL DATA:  Patient with trouble breathing for several days. EXAM: PORTABLE CHEST 1 VIEW COMPARISON:  April 22, 2019 FINDINGS: The patient has chronic interstitial lung disease. More focal opacity is seen in the right base than elsewhere which is new since April 14, 2019. No other interval changes. IMPRESSION: 1. Findings are chronic interstitial lung disease. 2. Persistent infiltrate in the right base worrisome for pneumonia or aspiration. Recommend follow-up to resolution. Electronically Signed   By: Dorise Bullion III M.D   On: 04/25/2019 09:37   Dg Chest Portable 1 View  Result Date: 04/22/2019 CLINICAL DATA:  Shortness of breath EXAM: PORTABLE CHEST 1 VIEW COMPARISON:  04/14/2019 FINDINGS: Cardiac silhouette is stable, appearing mildly enlarged. Diffuse chronic interstitial lung changes, with increasing interstitial opacity. More focal airspace consolidation in the right lower lobe. No pneumothorax. IMPRESSION: 1. Right lower lobe airspace consolidation suspicious for pneumonia in the appropriate clinical setting. 2. Diffuse chronic interstitial changes with suggestion of superimposed interstitial edema. Electronically Signed   By: Davina Poke M.D.   On: 04/22/2019 11:33     CBC Recent Labs  Lab 04/27/19 0452 04/30/19 0425  WBC 14.5* 18.0*  HGB 12.5 12.8  HCT 40.1 40.4  PLT 388 371  MCV 94.6 93.7  MCH 29.5 29.7  MCHC 31.2 31.7  RDW 12.3 12.4    Chemistries  Recent Labs  Lab 04/25/19 0514 04/27/19 0452 04/30/19 0425  NA 139 139 136  K 4.1 4.1 4.5  CL 104 103 100  CO2 24 31 27   GLUCOSE 151* 179* 172*  BUN 25* 27* 20  CREATININE 0.54 0.58 0.66  CALCIUM 8.8* 8.7* 8.2*    Cardiac Enzymes  No results for input(s): CKMB, TROPONINI, MYOGLOBIN in the last 168 hours.  Invalid input(s): CK ------------------------------------------------------------------------------------------------------------------    Component Value Date/Time   BNP 31.0 04/22/2019 1229   Barton Dubois M.D  [phone]  on 05/01/2019 at 5:29 PM  Go to [email or web address] - for contact info  [facility] - Office  [phone]

## 2019-05-01 NOTE — Progress Notes (Addendum)
Daily Progress Note   Patient Name: Dawn Nichols       Date: 05/01/2019 DOB: April 24, 1956  Age: 63 y.o. MRN#: [id number] Attending Physician: Barton Dubois, MD Primary Care Physician: [name], DO Admit Date: 04/22/2019  Reason for Consultation/Follow-up: Establishing goals of care  Subjective/GOC:  Patient awake, alert, oriented and able to participate in [place]. Sitting in recliner this afternoon. Currently on 7L [place]. Great appetite.   Extensive f/u discussion with patient and husband [name]) regarding plan of care.   Patient and husband understand diagnoses and poor prognosis. Knowing time may be short, [name] wishes to be home with family and friends.   Introduced hospice options and philosophy. Explained role of comfort, quality of life, and dignity at EOL, including focus on symptom management and preventing recurrent hospitalization. Educated on EOL expectations.   Discussed DME: Patient would benefit from prn Bipap at home for comfort and quality of life with progressive IPF. She also needs hospital bed, nebulizer machine, oxygen humidifier, and pure wick if possible.   Discussed comfort medications especially use of prn oxycodone or Roxanol not only for pain but for shortness of breath.  [name] understands the [name] will not cure her condition, but she does desire to continue taking Ofev as long as she can, hoping it may give her a little more time. [name] is currently covered under her insurance and her co-pay is through a Programmer, applications.   Therapeutic listening and emotional/spiritual support provided. Shelbey and [name] are appropriately saddened by poor prognosis but they knew this was eventually coming, and [name] is very much at peace with her prognosis. She is a spiritual individual  and places her life in God's hands.   Answered all questions and concerns. Spoke with [name], SW about DME and plan of care while with the [facility]. They have PMT contact information.    Length of Stay: 9  Current Medications: Scheduled Meds:  . apixaban  5 mg Oral BID  . arformoterol  15 mcg Nebulization BID  . budesonide (PULMICORT) nebulizer solution  0.5 mg Nebulization BID  . feeding supplement (ENSURE ENLIVE)  237 mL Oral BID BM  . FLUoxetine  40 mg Oral Daily  . gabapentin  600 mg Oral QID  . guaiFENesin  600 mg Oral BID  . insulin aspart  0-5  Units Subcutaneous QHS  . insulin aspart  0-9 Units Subcutaneous TID WC  . mouth rinse  15 mL Mouth Rinse BID  . methylPREDNISolone (SOLU-MEDROL) injection  60 mg Intravenous Q8H  . multivitamin with minerals  1 tablet Oral QHS  . Nintedanib  150 mg Oral BID  . pantoprazole  40 mg Oral Daily  . sodium chloride flush  3 mL Intravenous Q12H    Continuous Infusions: . sodium chloride      PRN Meds: sodium chloride, acetaminophen **OR** acetaminophen, albuterol, loperamide, ondansetron **OR** ondansetron (ZOFRAN) IV, oxyCODONE-acetaminophen, sodium chloride, sodium chloride flush, traZODone  Physical Exam Vitals signs and nursing note reviewed.  Constitutional:      General: She is awake.  HENT:     Head: Normocephalic and atraumatic.  Cardiovascular:     Rate and Rhythm: Normal rate.  Pulmonary:     Effort: No tachypnea, accessory muscle usage or respiratory distress.     Comments: 7L HFNC, intermittent dyspnea at rest Skin:    General: Skin is warm and dry.  Neurological:     Mental Status: She is alert and oriented to person, place, and time.  Psychiatric:        Mood and Affect: Mood normal.        Speech: Speech normal.        Behavior: Behavior normal.        Cognition and Memory: Cognition normal.            Vital Signs: BP (!) 139/97 (BP Location: Right Arm)   Pulse 80   Temp 97.8 F (36.6 C) (Oral)   Resp  (!) 21   Ht 5\' 3"  (1.6 m)   Wt 82.3 kg   SpO2 98%   BMI 32.14 kg/m  SpO2: SpO2: 98 % O2 Device: O2 Device: (nebulizer) O2 Flow Rate: O2 Flow Rate (L/min): 7 L/min  Intake/output summary:   Intake/Output Summary (Last 24 hours) at 05/01/2019 1224 Last data filed at 05/01/2019 1100 Gross per 24 hour  Intake 480 ml  Output 1600 ml  Net -1120 ml   LBM: Last BM Date: 04/30/19 Baseline Weight: Weight: 83.9 kg Most recent weight: Weight: 82.3 kg       Palliative Assessment/Data: PPS 40%    Flowsheet Rows     Most Recent Value  Intake Tab  Referral Department  Hospitalist  Unit at Time of Referral  ICU  Palliative Care Primary Diagnosis  Pulmonary  Palliative Care Type  New Palliative care  Reason for referral  Clarify Goals of Care  Date first seen by Palliative Care  04/24/19  Clinical Assessment  Palliative Performance Scale Score  40%  Psychosocial & Spiritual Assessment  Palliative Care Outcomes  Patient/Family meeting held?  Yes  Who was at the meeting?  patient and husband  Palliative Care Outcomes  Clarified goals of care, Counseled regarding hospice, Provided end of life care assistance, Provided psychosocial or spiritual support, ACP counseling assistance      Patient Active Problem List   Diagnosis Date Noted  . Palliative care by specialist   . Interstitial pulmonary fibrosis ([place])   . Dyspnea and respiratory abnormalities   . Community acquired pneumonia of right lower lobe of lung ([place]) 04/22/2019  . Chronic heart failure with preserved ejection fraction (HFpEF)/dCHF -EF 60 to 65% 04/22/2019  . 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 Stress and adjustment reaction 08/28/2018  . Drug-induced constipation 08/20/2018  . Lipoma of torso 08/20/2018  . Mediastinal adenopathy 08/06/2018  .  Hilar adenopathy 08/06/2018  . Tobacco abuse 07/18/2018  . Goals of care, counseling/discussion 07/18/2018  . Chronic respiratory failure with hypoxia due to COPD/ILD-- on  6L/[facility] at [facility] 07/18/2018  . Thrombus of right ventricle without MI 06/26/2018  . Lung mass   . Hypoxemia   . Ground glass opacity present on imaging of lung   . H/o Pulmonary embolism - currently on Eliquis 06/25/2018  . Acute CHF (congestive heart failure) ([place]) 06/25/2018  . Centrilobular emphysema ([place]) 06/25/2018  . History of non-ST elevation myocardial infarction (NSTEMI) 06/25/2018  . Chronic back pain 01/01/2014  . Scoliosis 01/01/2014  . Peripheral neuropathy 01/01/2014    Palliative Care Assessment & Plan   Patient Profile: 63 y.o. female  with past medical history of ILD/IPF on 6L home oxygen, centrilobular emphysema, PE on Eliquis admitted on 04/22/2019 with worsening respiratory symptoms and hypoxia. Followed by [name] for ILD/IPF. Recent visit on 7/28 and CT chest with worsening fibrosis. Patient started on antibiotics, steroids, bronchodilators, and mucolytics with possible right-sided pneumonia. Patient requiring 15L HFNC and BiPAP use. If this is IPF flare-up, [name] suspects very poor prognosis. Palliative medicine consultation for goals of care.   Assessment: Acute on chronic hypoxic respiratory failure IPF exacerbation Dyspnea Weakness  Recommendations/Plan:  Electronic MOST form completed with patient and husband on 04/28/19. Patient's wishes include: DNR/DNI, limited interventions including CPAP/BiPAP if indicated, IVF/ABX if indicated, and NO feeding tube. Durable DNR completed. Copies made for chart, patient, and family.   Continue current plan of care and medical management per attending.   Plan is for discharge home with hospice services.   DME: home trilogy/BiPAP prn, hospital bed, nebulizer machine, and oxygen humidifier. Patient has BSC and walker.   Patient does wish to continue Ofev. SW to evaluate if insurance will continue to cover with addition of hospice services. Patient's Ofev co-pay is through a Programmer, applications.   Will need  ambulance transport home.   Symptom management  Continue prn nebs  Continue oxycodone prn pain/dyspnea  Continue trazodone HS prn sleep  Low dose Roxanol 5mg  PO q4h prn pain/dypspnea/air hunger/tachypnea  Husband needs time to prepare the house for her arrival home with hospice. Updated attending. Anticipate discharge this weekend if stable.    Code Status: DNR/DNI   Code Status Orders  (From admission, onward)         Start     Ordered   04/28/19 1126  Do not attempt resuscitation (DNR)  Continuous    Question Answer Comment  In the event of cardiac or respiratory ARREST Do not call a "code blue"   In the event of cardiac or respiratory ARREST Do not perform Intubation, CPR, defibrillation or ACLS   In the event of cardiac or respiratory ARREST Use medication by any route, position, wound care, and other measures to relive pain and suffering. May use oxygen, suction and manual treatment of airway obstruction as needed for comfort.      04/28/19 1125        Code Status History    Date Active Date Inactive Code Status Order ID Comments User Context   04/22/2019 1717 04/28/2019 1125 Full Code [id number]  Roxan Hockey, MD Inpatient   06/25/2018 0406 06/28/2018 1951 Full Code [id number]  Rise Patience, MD Inpatient   Advance Care Planning Activity       Prognosis:  Poor long-term with acute on chronic respiratory failure with underlying IPF.  Discharge Planning:  Home with Hospice  Care plan  was discussed with patient, husband, Dr. Dyann Kief, [place]  Thank you for allowing the Palliative Medicine Team to assist in the care of this patient.   Time In: 1200 1500-  Time Out: 1220 1620 Total Time 100 Prolonged Time Billed yes      Greater than 50%  of this time was spent counseling and coordinating care related to the above assessment and plan.  Ihor Dow, DNP, FNP-C Palliative Medicine Team  Phone: [phone] Fax: [phone]  Please contact  Palliative Medicine Team phone at [phone] for questions and concerns.

## 2019-05-01 NOTE — TOC Progression Note (Signed)
Transition of Care [facility]) - Progression Note    Patient Details  Name: KEERAT DENICOLA MRN: [id number] Date of Birth: 1956/01/29  Transition of Care [facility]) CM/SW Contact  Ihor Gully, LCSW Phone Number: 05/01/2019, 5:06 PM  Clinical Narrative:    Spoke with patient, spouse and Palliative NP, Megan. Patient plans to go home with hospice. Per Jinny Blossom, patient is in need of Trilogy, pure wick, hospital bed, humidifier and neb machine. Patient's current DME supplier is [employer]. Juliann Pulse with [employer] notified of Trilogy need as well as other DME needs. MD notified of DME orders needed.  Message left for Doreatha Lew with [facility] requesting return contact and referral faxed.     Expected Discharge Plan: Long Term Acute Care (LTAC) Barriers to Discharge: Other (comment)(LTACH benefits/qualifying)  Expected Discharge Plan and Services Expected Discharge Plan: Long Term Acute Care (LTAC)     Post Acute Care Choice: Long Term Acute Care (LTAC)                                         Social Determinants of Health (SDOH) Interventions    Readmission Risk Interventions No flowsheet data found.

## 2019-05-02 ENCOUNTER — Ambulatory Visit: Payer: 59 | Admitting: Primary Care

## 2019-05-02 LAB — BLOOD GAS, ARTERIAL
Acid-Base Excess: 5.1 mmol/L — ABNORMAL HIGH (ref 0.0–2.0)
Bicarbonate: 29.1 mmol/L — ABNORMAL HIGH (ref 20.0–28.0)
FIO2: 48
O2 Saturation: 95.7 %
Patient temperature: 37.2
pCO2 arterial: 39.4 mmHg (ref 32.0–48.0)
pH, Arterial: 7.476 — ABNORMAL HIGH (ref 7.350–7.450)
pO2, Arterial: 79.7 mmHg — ABNORMAL LOW (ref 83.0–108.0)

## 2019-05-02 LAB — GLUCOSE, CAPILLARY
Glucose-Capillary: 154 mg/dL — ABNORMAL HIGH (ref 70–99)
Glucose-Capillary: 215 mg/dL — ABNORMAL HIGH (ref 70–99)
Glucose-Capillary: 136 mg/dL — ABNORMAL HIGH (ref 70–99)
Glucose-Capillary: 176 mg/dL — ABNORMAL HIGH (ref 70–99)

## 2019-05-02 NOTE — Progress Notes (Signed)
New referral for hospice at home received from [place] [phone]) at [facility].   Dawn Nichols is a 63 yo female who was admitted to [facility] on 8.11 with increased shortness of breath. She is on home oxygen at 7L/[place].  Medical history includes:  Interstitial Lung Disease, Idiopathic Pulmonary Fibrosis, emphysema, CHF (EF 60-65%)  hyperlipidemia, NSTEMI,  mediastinal adenopathy, PE on chronic anticoagulation with Eliquis, hypoxic respiratory failure.  She is post goals of care discussion with Palliative Medicine and per Ihor Dow NP, both Dawn Nichols and [name] are aware of her poor prognosis.  Dawn Nichols verbalizes she wants to spend the time left with her family at home.    I spoke with Dawn Nichols regarding hospice services.  Her husband was not present at the hospital, but was at work.  Dawn Nichols endorses hospice at home and would like to proceed with equipment order and establishing care at discharge. She confirms DNR status.  Portable DNR to accompany patient home.     Current DME:  Oxygen @ 6-7L/[place] ([employer]) DME Needs:  Hospital bed, Nebulizer, Bipap, geomatt, over bed table, trapeze, humidifier for oxygen.  Main Contact:  Dawn Nichols- husband [phone] Patient's room: # [phone]   [phone]  Thank you for allowing participation in this patient's care.  Dimas Aguas, RN Clinical Nurse Liaison [facility] [phone]

## 2019-05-02 NOTE — Progress Notes (Signed)
Patient Demographics:    Dawn Nichols, is a 63 y.o. female, DOB - [birth year], [id number]  Admit date - 04/22/2019   Admitting Physician [name], MD  Outpatient Primary MD for the patient is [name], DO  LOS - 10  Chief Complaint  Patient presents with   Shortness of Breath        Subjective:    Dawn Nichols continues to report shortness of breath with minimal exertion.  Denies chest pain, no nausea, no vomiting.  Went overnight without the use of BiPAP and tolerating well saturations while on high flow nasal cannula 7 L.   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, manufacturing systems with past medical history relevant for ILD/IPF, centrilobular emphysema, mediastinal adenopathy, pulmonary embolism on chronic anticoagulation with Eliquis, chronic hypoxic respiratory failure on 6 L of oxygen at home admitted on 04/22/2019 with with worsening respiratory symptoms and significant hypoxia--required continuous BiPAP after failing high flow oxygen - Suspected IPF flareup--high mortality risk and very poor short-term prognosis -Unable to discharge home or to a Facility as patient requires 15 L of oxygen frequent BiPAP --If LTAC appropriate...Marland KitchenMarland KitchenMarland Kitchen pulmonologist [name] recommends DNR status. patient waiting to talk to family   A/p 1)Acute on chronic Hypoxic  respiratory Failure---  --- -Procalcitonin is less than 0.1,  -Mild leukocytosis in the setting of steroid use -Afebrile -Troponin is not elevated, BNP 31,  -chest x-ray without volume overload/failure -COVID-19 negative -right-sided pneumonia or CHF much  less likely,  IPF flareup is more likely the culprit for patient's worsening respiratory status -Continues to require  HFNC supplementation (7L); and using BIPAP nightly. -Continue IV steroids, nebulizer bronchodilators and mucolytics  -will continue to titrate oxygen down while keeping oxygen saturations greater than 86-88%, as long as patient is not symptomatic.  She likely has some degree of chronic hypoxia at baseline.  2)Acute COPD exacerbation/underlying ILD--- COPD exacerbation secondary to pneumonia -patient has completed antibiotics for PNA -continue slow steroid tapering, bronchodilators and mucolytics  -BiPAP nightly PRN  -continue to wean oxygen supplementation as tolerated ---Case Discussed with [name] patient's primary pulmonologist (by Dr. Denton Brick)--- if this is a true IPF flareup then mortality is very very high in the short-term (- 90% -3 month mortality and 50% inpatient mortality with median survival 3-13 days)  3)Social/Ethics- -GOC discussed with palliative care and patient has agreed to DNR status. -continue to treat what is treatable and follow clinical response. -patient to be discharge home with home hospice.  4)Chronic anticoagulation due to history of PE- -continue Eliquis  5)Anxiety disorder- -stable mood -no hallucinations or SI -continue Prozac  6)Tobacco Abuse- -nicotine patch as ordered -cessation counseling provided  7)HFpEF--- last known  EF 60 to 65%, patient with chronic diastolic dysfunction CHF--- currently appears euvolemic, no CHF exacerbation clinically and radiologically at this time. -continue daily weights and low sodium diet.   8) steroid-induced hyperglycemia---- Use  Novolog/Humalog Sliding scale insulin with Accu-Cheks/Fingersticks as ordered -May improve after decreasing dose of Solu-Medrol -follow CBG's and adjust hypoglycemic regimen as needed.   Disposition/Need for in-Hospital Stay- patient unable to be discharged at this time due to requirement of HFNC supplementation. -Asked case management to review options of possible LTAC as a bridge to home.  -If patient does not improve in the next several days, may need to reconsider goals and discuss hospice. -no requiring BIPAP during the day for 96 hours now; patient activity capacity very limited. -will transfer to med-surg bed; wean oxygen as tolerated with intention for 86-88% O 2 sat; continue BIPAP nightly PRN. -Patient has been able to tolerate 48 hours without the use of BiPAP at nighttime; but after discussing with her that given increase shortness of breath with minimal exertion and also low oxygen saturation on high flow nasal cannula supplementation next step in treatment will be the use of BiPAP to mitigate symptoms if required.  ABG demonstrating a stable CO2 pH 7.476 and a PO2 while receiving high flow nasal cannula 7 L of 79.7.    Code Status : DNR  Family Communication:   (patient is alert, awake and coherent) -Discussed with patient  Disposition Plan  : -Patient in agreement now to go home with home hospice.  Has been able to titrate down her oxygen supplementation to 6-7 L high flow [place].  Equipment in the process to be delivered by hospice and hopefully able to go home in the next 24 hours.  Consults  : Phone consult with patient's pulmonologist [name] who is an IPF specialist. Palliative medicine  DVT Prophylaxis  : Eliquis  Lab Results  Component Value Date   PLT 371 04/30/2019   Inpatient Medications  Scheduled Meds:  apixaban  5 mg Oral BID   arformoterol  15 mcg Nebulization BID   budesonide (PULMICORT) nebulizer solution  0.5 mg Nebulization BID   feeding  supplement (ENSURE ENLIVE)  237 mL Oral BID BM   FLUoxetine  40 mg Oral Daily   gabapentin  600 mg Oral QID   guaiFENesin  600 mg Oral BID   insulin aspart  0-5 Units Subcutaneous QHS   insulin aspart  0-9 Units Subcutaneous TID WC   mouth rinse  15 mL Mouth Rinse BID   methylPREDNISolone (SOLU-MEDROL) injection  60 mg Intravenous Q8H   multivitamin with minerals  1 tablet Oral QHS   Nintedanib  150 mg Oral BID   pantoprazole  40 mg Oral Daily   sodium chloride flush  3 mL Intravenous Q12H   Continuous Infusions:  sodium chloride     PRN Meds:.sodium chloride, acetaminophen **OR** acetaminophen, albuterol, loperamide, morphine CONCENTRATE, ondansetron **OR** ondansetron (ZOFRAN) IV, oxyCODONE-acetaminophen, sodium chloride, sodium chloride flush, traZODone   Anti-infectives (From admission, onward)   Start     Dose/Rate Route Frequency Ordered Stop   04/22/19 1415  cefTRIAXone (ROCEPHIN) 1 g in sodium chloride 0.9 % 100 mL IVPB  Status:  Discontinued     1 g 200 mL/hr over 30 Minutes Intravenous Every 24 hours 04/22/19 1413 04/22/19 1417   04/22/19 1415  doxycycline (VIBRA-TABS) tablet 100 mg  Status:  Discontinued     100 mg Oral Every 12 hours 04/22/19 1413 04/29/19 0851   04/22/19 1230  cefTRIAXone (ROCEPHIN) 1 g in sodium chloride 0.9 % 100 mL IVPB  Status:  Discontinued     1 g 200 mL/hr over 30 Minutes Intravenous Every 24 hours 04/22/19 1225 04/29/19 0851   04/22/19 1230  azithromycin (ZITHROMAX) 500 mg in sodium chloride 0.9 % 250 mL IVPB  Status:  Discontinued     500 mg 250 mL/hr over 60 Minutes Intravenous Every 24 hours 04/22/19 1225 04/22/19 1703        Objective:   Vitals:   05/02/19 0529 05/02/19 0805 05/02/19 0811 05/02/19 1433  BP:    129/75  Pulse:    85  Resp:    20  Temp:    99 F (37.2 C)  TempSrc:    Oral  SpO2:  93% 97% 95%  Weight: 85.2 kg     Height:        Wt Readings from Last 3 Encounters:  05/02/19 85.2 kg  04/08/19 83.9 kg    11/12/18 83.5 kg    Intake/Output Summary (Last 24 hours) at 05/02/2019 1719 Last data filed at 05/02/2019 1703 Gross per 24 hour  Intake 360 ml  Output 4250 ml  Net -3890 ml     Physical Exam General exam: Alert, awake, oriented x 3; patient reports good urine output, no chest pain, no nausea, no vomiting.  Still using 6-7 L high flow nasal cannula supplementation.  48-hour without the need of BiPAP at nighttime.  Still with significant shortness of breath and desaturation with minimal exertion. Respiratory system: No wheezing, positive scattered rhonchi right, fair air movement bilaterally.  No crackles Cardiovascular system:RRR. No murmurs, rubs, gallops. Gastrointestinal system: Abdomen is nondistended, soft and nontender. No organomegaly or masses felt. Normal bowel sounds heard. Central nervous system: Alert and oriented. No focal neurological deficits. Extremities: No C/C/E, +pedal pulses Skin: No rashes, lesions or ulcers Psychiatry: Judgement and insight appear normal. Mood & affect appropriate.     Data Review:   Micro Results Recent Results (from the past 240 hour(s))  MRSA PCR Screening     Status: None   Collection Time: 04/22/19  6:12 PM   Specimen: Nasopharyngeal  Result Value Ref Range Status   MRSA by PCR NEGATIVE NEGATIVE Final    Comment:        The GeneXpert MRSA Assay (FDA approved for NASAL specimens only), is one component of a comprehensive MRSA colonization surveillance program. It is not intended to diagnose MRSA infection nor to guide or monitor treatment for MRSA infections. Performed at [facility], [address] [place], [place] [address]     Radiology Reports Dg Chest [place] 1 View  Result Date: 04/25/2019 CLINICAL DATA:  Patient with trouble breathing for several days. EXAM: PORTABLE CHEST 1 VIEW COMPARISON:  April 22, 2019 FINDINGS: The patient has chronic interstitial lung disease. More focal opacity is seen in the right base than  elsewhere which is new since April 14, 2019. No other interval changes. IMPRESSION: 1. Findings are chronic interstitial lung disease. 2. Persistent infiltrate in the right base worrisome for pneumonia or aspiration. Recommend follow-up to resolution. Electronically Signed   By: Dorise Bullion III M.D   On: 04/25/2019 09:37   Dg Chest Portable 1 View  Result Date: 04/22/2019 CLINICAL DATA:  Shortness of breath EXAM: PORTABLE CHEST 1 VIEW COMPARISON:  04/14/2019 FINDINGS: Cardiac silhouette is stable, appearing mildly enlarged. Diffuse chronic interstitial lung changes, with increasing interstitial opacity. More focal airspace consolidation in the right lower lobe. No pneumothorax. IMPRESSION:  1. Right lower lobe airspace consolidation suspicious for pneumonia in the appropriate clinical setting. 2. Diffuse chronic interstitial changes with suggestion of superimposed interstitial edema. Electronically Signed   By: Davina Poke M.D.   On: 04/22/2019 11:33     CBC Recent Labs  Lab 04/27/19 0452 04/30/19 0425  WBC 14.5* 18.0*  HGB 12.5 12.8  HCT 40.1 40.4  PLT 388 371  MCV 94.6 93.7  MCH 29.5 29.7  MCHC 31.2 31.7  RDW 12.3 12.4    Chemistries  Recent Labs  Lab 04/27/19 0452 04/30/19 0425  NA 139 136  K 4.1 4.5  CL 103 100  CO2 31 27  GLUCOSE 179* 172*  BUN 27* 20  CREATININE 0.58 0.66  CALCIUM 8.7* 8.2*    Cardiac Enzymes No results for input(s): CKMB, TROPONINI, MYOGLOBIN in the last 168 hours.  Invalid input(s): CK ------------------------------------------------------------------------------------------------------------------    Component Value Date/Time   BNP 31.0 04/22/2019 1229   Barton Dubois M.D  [phone]  on 05/02/2019 at 5:19 PM  Go to [email or web address] - for contact info  [facility] - Office  [phone]

## 2019-05-02 NOTE — Care Management Important Message (Signed)
Important Message  Patient Details  Name: Dawn Nichols MRN: [id number] Date of Birth: 11/19/1955   Medicare Important Message Given:  Yes     Tommy Medal 05/02/2019, 2:18 PM

## 2019-05-02 NOTE — TOC Progression Note (Signed)
Transition of Care [facility]) - Progression Note    Patient Details  Name: Dawn Nichols MRN: [id number] Date of Birth: 05-11-1956  Transition of Care [facility]) CM/SW Contact  Ihor Gully, LCSW Phone Number: 05/02/2019, 2:51 PM  Clinical Narrative:    Patient referred to [facility] for hospice care at home. Referral faxed and received by Doreatha Lew with [facility].  Discussed DME needs Hospital bed with mattress to help with back pain, nebulizer machine, humidifier and pure wick (they are unable to get). Marcene Brawn indicated that she was in the process of reaching out to Mr. Mayfield to discuss enrollment and equipment.    Expected Discharge Plan: Long Term Acute Care (LTAC) Barriers to Discharge: Other (comment)(LTACH benefits/qualifying)  Expected Discharge Plan and Services Expected Discharge Plan: Long Term Acute Care (LTAC)     Post Acute Care Choice: Long Term Acute Care (LTAC)                                         Social Determinants of Health (SDOH) Interventions    Readmission Risk Interventions No flowsheet data found.

## 2019-05-02 NOTE — Progress Notes (Signed)
Patient refuse CPAP/BiPAP as before. She is on 7 liters high flow

## 2019-05-03 DIAGNOSIS — J9621 Acute and chronic respiratory failure with hypoxia: Secondary | ICD-10-CM

## 2019-05-03 DIAGNOSIS — J849 Interstitial pulmonary disease, unspecified: Secondary | ICD-10-CM

## 2019-05-03 LAB — GLUCOSE, CAPILLARY
Glucose-Capillary: 177 mg/dL — ABNORMAL HIGH (ref 70–99)
Glucose-Capillary: 211 mg/dL — ABNORMAL HIGH (ref 70–99)
Glucose-Capillary: 225 mg/dL — ABNORMAL HIGH (ref 70–99)
Glucose-Capillary: 196 mg/dL — ABNORMAL HIGH (ref 70–99)

## 2019-05-03 MED ORDER — ALBUTEROL SULFATE (2.5 MG/3ML) 0.083% IN NEBU: 2.5000 mg | INHALATION_SOLUTION | RESPIRATORY_TRACT | 4 refills | Status: AC | PRN

## 2019-05-03 MED ORDER — ACETAMINOPHEN 325 MG PO TABS: 650.0000 mg | ORAL_TABLET | Freq: Four times a day (QID) | ORAL | 0 refills | Status: AC | PRN

## 2019-05-03 MED ORDER — PANTOPRAZOLE SODIUM 40 MG PO TBEC: 40.0000 mg | DELAYED_RELEASE_TABLET | Freq: Every day | ORAL | 1 refills | Status: AC

## 2019-05-03 MED ORDER — BUDESONIDE 0.5 MG/2ML IN SUSP: 0.5000 mg | Freq: Two times a day (BID) | RESPIRATORY_TRACT | 1 refills | Status: AC

## 2019-05-03 MED ORDER — MORPHINE SULFATE (CONCENTRATE) 10 MG/0.5ML PO SOLN: 5.0000 mg | ORAL | 0 refills | Status: DC | PRN

## 2019-05-03 MED ORDER — TRAZODONE HCL 50 MG PO TABS: 50.0000 mg | ORAL_TABLET | Freq: Every evening | ORAL | 0 refills | Status: AC | PRN

## 2019-05-03 MED ORDER — PREDNISONE 20 MG PO TABS: ORAL_TABLET | ORAL | 0 refills | Status: AC

## 2019-05-03 NOTE — Discharge Summary (Signed)
Physician Discharge Summary  Dawn Nichols [id number] DOB: 02/05/1956 DOA: 04/22/2019  PCP: [name], DO  Admit date: 04/22/2019 Discharge date: 05/03/2019  Time spent: 35 minutes  Recommendations for Outpatient Follow-up:  1. Patient has been discharged home with home hospice 2. Continue cleaning her medication list focusing more: 4 and treating the treatable without long-term medications intervention for preventive purposes. 3. Follow CBG levels and initiate treatment with hypoglycemic regimen if needed.   Discharge Diagnoses:  Principal Problem:   Community acquired pneumonia of right lower lobe of lung ([place]) Active Problems:   H/o Pulmonary embolism - currently on Eliquis   Centrilobular emphysema (HCC)   Tobacco abuse   Goals of care, counseling/discussion   Chronic respiratory failure with hypoxia due to COPD/ILD-- on 6L/min at Home   Chronic heart failure with preserved ejection fraction (HFpEF)/dCHF -EF 60 to 65%   Palliative care by specialist   Interstitial pulmonary fibrosis (HCC)   Dyspnea and respiratory abnormalities   Acute on chronic respiratory failure with hypoxia (HCC)   ILD (interstitial lung disease) ([place])   Discharge Condition: Stable and overall improved.  Patient discharged home with home hospice and instruction to follow-up with PCP in 10 to 14 days.  Continue adjusted amount of oxygen supplementation and the use of as needed NIV BIPAP.  Diet recommendation: Heart healthy diet.  Filed Weights   05/01/19 0534 05/02/19 0529 05/03/19 0501  Weight: 82.3 kg 85.2 kg 83.5 kg    History of present illness:  As per H&P written by Dr. 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, manufacturing systems with past medical history relevant for ILD/IPF, centrilobular emphysema, mediastinal adenopathy, pulmonary embolism on chronic anticoagulation with Eliquis, chronic hypoxic respiratory failure on 6 L of oxygen at home presents with worsening  respiratory symptoms and significant hypoxia--when EMS got to patient home O2 sats was 78% on 8 L  --- She has had increased productive cough, fatigue, malaise, chills, no high fevers  In ED chest x-ray suggestive of right-sided pneumonia -WBC is 11.6 -COVID-19 negative ---Troponin negative, BNP not elevated,  -Despite high flow oxygen hypoxia and increased work of breathing persisted so patient will be admitted to stepdown for BiPAP initiation  -Denies chest pains, denies pleuritic symptoms, patient tells me she has been compliant with Eliquis.  Hospital Course:  1)Acute on chronic Hypoxic respiratory Failure--- -Procalcitonin is less than 0.1,  -leukocytosis in the setting of steroid use -Afebrile -Troponin is not elevated, BNP 31,  -chest x-ray without volume overload/failure -COVID-19 negative -right-sided pneumonia or CHF less likely,  IPF flareup is more likely the culprit for patient's worsening respiratory status -Continues to require  HFNC supplementation (7L); and using BIPAP PRN. -Continue steroids tapering, nebulizer treatment, bronchodilators and mucolytics  -will continue to titrate oxygen down while keeping oxygen saturations greater than 86-88%, as long as patient is not symptomatic.  -tolerating 6-7L [place] supplementation at discharge.  2)Acute COPD exacerbation/underlying ILD---COPD exacerbation secondary to pneumonia -patient has completed antibiotics for PNA -No fever and elevated WBC's in the setting of steroids usage.  -continue slow steroid tapering, bronchodilators and mucolytics  -BiPAP PRN  -continue to wean oxygen supplementation as tolerated (chronically on 5-6L) ---Case Discussed with [name] patient's primary pulmonologist (by Dr. Denton Brick)--- if this is a true IPF flareup then mortality is very very high in the short-term (- 90% -3 month mortality and 50% inpatient mortality with median survival 13 days)  3)Social/Ethics- -GOC discussed  with palliative  care and patient has agreed to be DNR/DNI status. -continue to treat what is treatable and follow clinical response. -patient discharged home with home hospice.  4)Chronic anticoagulation due to history of PE- -continue Eliquis -Rate controlled and no pleuritic chest pain.  5)Anxiety disorder- -stable mood -no hallucinations or SI -continue Prozac and PRN trazodone.  6)Tobacco Abuse- -nicotine patch ordered -Extensive cessation counseling provided  7) chronic diastolic heart failure ---last known EF 60 to 65%,patient with chronic diastolic dysfunction  -currently appears euvolemic, no CHF exacerbation clinically and/or radiologically at this time. -continue daily weights and low sodium diet.   8) steroid-induced hyperglycemia -Treated with a sliding scale insulin close CBG 121 inpatient -Patient advised to follow modified carbohydrate diet -Anticipate continued improvement in her blood sugar levels once steroids completely tapered off. -follow CBG's and initiate hypoglycemic regimen if needed.   9)Disposition-  -Patient has been able to tolerate 72 hours without the use of BiPAP at nighttime; but after discussing with her and evaluating increase shortness of breath with minimal exertion and also low oxygen saturation on high flow nasal cannula supplementation next step in treatment will be the use of BiPAP to mitigate symptoms if required.  ABG demonstrated stable CO2  Level, pH of 7.476 and a PO2 while receiving high flow nasal cannula 7 L of 79.7.   Procedures:  See below for x-ray reports  Consultations:  None  Discharge Exam: Vitals:   05/03/19 0804 05/03/19 1348  BP:  137/81  Pulse:  95  Resp:  20  Temp:  98.1 F (36.7 C)  SpO2: 98% 93%   General exam: Alert, awake, oriented x 3; patient reports good urine output, no chest pain, no nausea, no vomiting.  Still using 6-7 L high flow nasal cannula supplementation.  72-hour without the need of  BiPAP at nighttime.  Still with significant shortness of breath and desaturation with exertion. Respiratory system: No wheezing, positive scattered rhonchi right, fair air movement bilaterally.  No crackles Cardiovascular system:RRR. No murmurs, rubs, gallops. Gastrointestinal system: Abdomen is nondistended, soft and nontender. No organomegaly or masses felt. Normal bowel sounds heard. Central nervous system: Alert and oriented. No focal neurological deficits. Extremities: No C/C/E, +pedal pulses Skin: No rashes, lesions or ulcers Psychiatry: Judgement and insight appear normal. Mood & affect appropriate.    Discharge Instructions   Discharge Instructions    Diet - low sodium heart healthy   Complete by: As directed    Discharge instructions   Complete by: As directed    Follow heart healthy diet Take medications as prescribed Follow instructions backslash recommendations from home hospice Maintain adequate hydration. Make sure to paced yourself while doing activity to minimize worsening shortness of breath.     Allergies as of 05/03/2019   No Known Allergies     Medication List    STOP taking these medications   oxyCODONE-acetaminophen 10-325 MG tablet Commonly known as: PERCOCET     TAKE these medications   acetaminophen 325 MG tablet Commonly known as: TYLENOL Take 2 tablets (650 mg total) by mouth every 6 (six) hours as needed for mild pain (or Fever >/= 101).   albuterol 108 (90 Base) MCG/ACT inhaler Commonly known as: VENTOLIN HFA Inhale 2 puffs into the lungs every 6 (six) hours as needed for wheezing or shortness of breath. What changed: Another medication with the same name was added. Make sure you understand how and when to take each.   albuterol (2.5 MG/3ML) 0.083% nebulizer solution Commonly known as:  PROVENTIL Take 3 mLs (2.5 mg total) by nebulization every 2 (two) hours as needed for wheezing or shortness of breath (if symptoms not relieved by  inhaler.). What changed: You were already taking a medication with the same name, and this prescription was added. Make sure you understand how and when to take each.   Anoro Ellipta 62.5-25 MCG/INH Aepb Generic drug: umeclidinium-vilanterol TAKE 1 PUFF BY MOUTH EVERY DAY What changed: See the new instructions.   apixaban 5 MG Tabs tablet Commonly known as: ELIQUIS Take 1 tablet (5 mg total) by mouth 2 (two) times daily.   budesonide 0.5 MG/2ML nebulizer solution Commonly known as: PULMICORT Take 2 mLs (0.5 mg total) by nebulization 2 (two) times daily.   FLUoxetine 40 MG capsule Commonly known as: PROZAC Take 1 capsule (40 mg total) by mouth daily.   gabapentin 300 MG capsule Commonly known as: NEURONTIN Take 300-600 mg by mouth 4 (four) times daily.   morphine CONCENTRATE 10 MG/0.5ML Soln concentrated solution Take 0.25 mLs (5 mg total) by mouth every 4 (four) hours as needed for moderate pain or shortness of breath (tachypnea/air hunger).   nystatin powder Commonly known as: MYCOSTATIN/NYSTOP Apply topically 4 (four) times daily. What changed:   when to take this  reasons to take this   nystatin-triamcinolone ointment Commonly known as: MYCOLOG Apply 1 application topically 2 (two) times daily. What changed:   when to take this  reasons to take this   Ofev 150 MG Caps Generic drug: Nintedanib Take 1 capsule (150 mg total) by mouth 2 (two) times daily.   pantoprazole 40 MG tablet Commonly known as: PROTONIX Take 1 tablet (40 mg total) by mouth daily. Start taking on: May 04, 2019   predniSONE 20 MG tablet Commonly known as: Deltasone Take 3 tablets by mouth daily x2 days; then 2 tablets by mouth daily x3 days; then 1 tablet by mouth daily x3 days; then half tablet by mouth daily x3 days and stop prednisone.   traZODone 50 MG tablet Commonly known as: DESYREL Take 1 tablet (50 mg total) by mouth at bedtime as needed for sleep.            Durable  Medical Equipment  (From admission, onward)         Start     Ordered   05/01/19 1617  For home use only DME Nebulizer machine  Once    Question Answer Comment  Patient needs a nebulizer to treat with the following condition IPF (idiopathic pulmonary fibrosis) ([place])   Length of Need Lifetime      05/01/19 1617   05/01/19 1615  For home use only DME Bipap  Once    Question:  Length of Need  Answer:  6 Months   05/01/19 1617   05/01/19 1615  For home use only DME Hospital bed  Once    Question Answer Comment  Length of Need 6 Months   Patient has (list medical condition): ILD/IPF, chronic resp failure; diastolic HF.   The above medical condition requires: Patient requires the ability to reposition immediately   Head must be elevated greater than: 30 degrees   Bed type Semi-electric   Hoyer Lift Yes   Trapeze Bar Yes   Support Surface: Gel Overlay      05/01/19 1617         No Known Allergies Follow-up Information    [name], DO. Schedule an appointment as soon as possible for a visit in 42  day(s).   Specialty: Family Medicine Contact information: [place] [address] [phone]        Elouise Munroe, MD .   Specialties: Cardiology, Radiology Contact information: [address] [place] [address] [phone]            The results of significant diagnostics from this hospitalization (including imaging, microbiology, ancillary and laboratory) are listed below for reference.    Significant Diagnostic Studies: Dg Chest Port 1 View  Result Date: 04/25/2019 CLINICAL DATA:  Patient with trouble breathing for several days. EXAM: PORTABLE CHEST 1 VIEW COMPARISON:  April 22, 2019 FINDINGS: The patient has chronic interstitial lung disease. More focal opacity is seen in the right base than elsewhere which is new since April 14, 2019. No other interval changes. IMPRESSION: 1. Findings are chronic interstitial lung disease.  2. Persistent infiltrate in the right base worrisome for pneumonia or aspiration. Recommend follow-up to resolution. Electronically Signed   By: Dorise Bullion III M.D   On: 04/25/2019 09:37   Dg Chest Portable 1 View  Result Date: 04/22/2019 CLINICAL DATA:  Shortness of breath EXAM: PORTABLE CHEST 1 VIEW COMPARISON:  04/14/2019 FINDINGS: Cardiac silhouette is stable, appearing mildly enlarged. Diffuse chronic interstitial lung changes, with increasing interstitial opacity. More focal airspace consolidation in the right lower lobe. No pneumothorax. IMPRESSION: 1. Right lower lobe airspace consolidation suspicious for pneumonia in the appropriate clinical setting. 2. Diffuse chronic interstitial changes with suggestion of superimposed interstitial edema. Electronically Signed   By: Davina Poke M.D.   On: 04/22/2019 11:33    Microbiology: No results found for this or any previous visit (from the past 240 hour(s)).   Labs: Basic Metabolic Panel: Recent Labs  Lab 04/27/19 0452 04/30/19 0425  NA 139 136  K 4.1 4.5  CL 103 100  CO2 31 27  GLUCOSE 179* 172*  BUN 27* 20  CREATININE 0.58 0.66  CALCIUM 8.7* 8.2*   CBC: Recent Labs  Lab 04/27/19 0452 04/30/19 0425  WBC 14.5* 18.0*  HGB 12.5 12.8  HCT 40.1 40.4  MCV 94.6 93.7  PLT 388 371    BNP (last 3 results) Recent Labs    06/25/18 1521 04/22/19 1229  BNP 458.3* 31.0    CBG: Recent Labs  Lab 05/02/19 1111 05/02/19 1657 05/02/19 2152 05/03/19 0736 05/03/19 1126  GLUCAP 215* 136* 176* 177* 211*    Signed:  Barton Dubois MD.  [facility] 05/03/2019, 4:10 PM

## 2019-05-03 NOTE — Progress Notes (Signed)
Pt D/C instructions given to pt, verbalized understanding. EMS set up for transport home due to patient needing high flow oxygen for discharge. All patient's belongings gathered for home. IV and purewick still in place until patient close to leaving. Will continue to monitor and pass on to night shift RN.

## 2019-05-03 NOTE — Progress Notes (Addendum)
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  DME was ordered yesterday with [place] with Dawn Nichols this am, she advised that DME has not been delivered yet and that [place] will need to coordinate her d/c time to meet her at the home to check her V/S (?).  She was unsure why.  She states she is not sure if she is discharging today.  [facility] RN was scheduled to be at her home today at 3pm to begin hospice services.   Will reach out to LCSW to see if d/c plans have changed.  Thank you, Venia Carbon RN, BSN, [facility] Liaison (direct # in [place] under [facility]) [phone] (main 24 hour #)  **Update 1215, spouse confirms she will discharge today once DME set up.  DME provider called to arrange delivery time with spouse, but only advised him "it would be delivered later today".  Spouse advised to call unit once set up and let them know they can arrange to send her home via [facility].  Hospice will see them at their home on 8/23 10 am.

## 2019-05-04 NOTE — Progress Notes (Signed)
Patient discharged home via [facility]. IV discontinued, pure wick discontinued. Patient's belongings sent with patient.

## 2019-05-06 ENCOUNTER — Ambulatory Visit (INDEPENDENT_AMBULATORY_CARE_PROVIDER_SITE_OTHER): Payer: 59 | Admitting: Family Medicine

## 2019-05-06 ENCOUNTER — Telehealth: Payer: Self-pay | Admitting: Primary Care

## 2019-05-06 DIAGNOSIS — Z09 Encounter for follow-up examination after completed treatment for conditions other than malignant neoplasm: Secondary | ICD-10-CM

## 2019-05-06 DIAGNOSIS — J9621 Acute and chronic respiratory failure with hypoxia: Secondary | ICD-10-CM

## 2019-05-06 DIAGNOSIS — Z515 Encounter for palliative care: Secondary | ICD-10-CM | POA: Diagnosis not present

## 2019-05-06 DIAGNOSIS — J8410 Pulmonary fibrosis, unspecified: Secondary | ICD-10-CM | POA: Diagnosis not present

## 2019-05-06 DIAGNOSIS — J189 Pneumonia, unspecified organism: Secondary | ICD-10-CM

## 2019-05-06 DIAGNOSIS — J181 Lobar pneumonia, unspecified organism: Secondary | ICD-10-CM | POA: Diagnosis not present

## 2019-05-06 DIAGNOSIS — J9611 Chronic respiratory failure with hypoxia: Secondary | ICD-10-CM

## 2019-05-06 DIAGNOSIS — J849 Interstitial pulmonary disease, unspecified: Secondary | ICD-10-CM

## 2019-05-06 MED ORDER — ANORO ELLIPTA 62.5-25 MCG/INH IN AEPB: INHALATION_SPRAY | RESPIRATORY_TRACT | 3 refills | Status: AC

## 2019-05-06 NOTE — Telephone Encounter (Signed)
Did they start her on ANORO in the hospital? Yes she can continue, give 3 refills

## 2019-05-06 NOTE — Progress Notes (Signed)
Telephone visit  Subjective: CC: hospital follow up PCP: [name], DO HPI:Dawn Nichols is a 63 y.o. female calls for telephone consult today. Patient provides verbal consent for consult held via phone.  Location of patient: home Location of provider: Working remotely from home Others present for call: hospice aid  1. Hospital follow up Patient was admitted to the hospital on 04/22/2019 and discharged on 05/03/2019.  She was admitted for presumed community-acquired pneumonia of the right lower lobe.  On presentation she was in respiratory failure despite high flow oxygen.  She was admitted to stepdown for BiPAP.  She was treated with IV antibiotics and ultimately transitioned over to oral antibiotics.  There was concern for possible progression of her chronic pulmonary disease as well.  She was discharged with home hospice.  She notes that since she is gone home she has been doing okay.  She continues to have dyspnea with activity.  Typically when she is resting her pulse ox will stay in the 90s but after she ambulates it drops down into the low 80s.  She has been using 7 to 8 L of oxygen at home and has BiPAP to use as well.  They recently came in to do instructions with her for the BiPAP.  She has a clinical Education officer, museum, hospice nurse and hospice health aide.  She reports satisfaction with how things are going at home.  She reports that she is excepted her diagnosis and that she is nearing the end of her life.  She seems at peace with this.  She reports having good social support through friends and family.  She has no concerns at this time but wanted to update me on her recent hospitalization.   ROS: Per HPI  No Known Allergies Past Medical History:  Diagnosis Date  . Arthritis   . CHF (congestive heart failure) ([place])   . Emphysema of lung ([place])   . Hyperlipidemia   . NSTEMI (non-ST elevated myocardial infarction) ([place]) 06/25/2018  . Pulmonary fibrosis ([place])   . Tobacco use      Current Outpatient Medications:  .  acetaminophen (TYLENOL) 325 MG tablet, Take 2 tablets (650 mg total) by mouth every 6 (six) hours as needed for mild pain (or Fever >/= 101)., Disp: 30 tablet, Rfl: 0 .  albuterol (PROVENTIL HFA;VENTOLIN HFA) 108 (90 Base) MCG/ACT inhaler, Inhale 2 puffs into the lungs every 6 (six) hours as needed for wheezing or shortness of breath., Disp: 1 Inhaler, Rfl: 2 .  albuterol (PROVENTIL) (2.5 MG/3ML) 0.083% nebulizer solution, Take 3 mLs (2.5 mg total) by nebulization every 2 (two) hours as needed for wheezing or shortness of breath (if symptoms not relieved by inhaler.)., Disp: 75 mL, Rfl: 4 .  ANORO ELLIPTA 62.5-25 MCG/INH AEPB, TAKE 1 PUFF BY MOUTH EVERY DAY (Patient taking differently: Inhale 1 puff into the lungs daily. ), Disp: 60 each, Rfl: 3 .  apixaban (ELIQUIS) 5 MG TABS tablet, Take 1 tablet (5 mg total) by mouth 2 (two) times daily., Disp: 60 tablet, Rfl: 3 .  budesonide (PULMICORT) 0.5 MG/2ML nebulizer solution, Take 2 mLs (0.5 mg total) by nebulization 2 (two) times daily., Disp: 240 mL, Rfl: 1 .  FLUoxetine (PROZAC) 40 MG capsule, Take 1 capsule (40 mg total) by mouth daily., Disp: 90 capsule, Rfl: 1 .  gabapentin (NEURONTIN) 300 MG capsule, Take 300-600 mg by mouth 4 (four) times daily. , Disp: , Rfl:  .  Morphine Sulfate (MORPHINE CONCENTRATE) 10 MG/0.5ML SOLN  concentrated solution, Take 0.25 mLs (5 mg total) by mouth every 4 (four) hours as needed for moderate pain or shortness of breath (tachypnea/air hunger)., Disp: 30 mL, Rfl: 0 .  Nintedanib (OFEV) 150 MG CAPS, Take 1 capsule (150 mg total) by mouth 2 (two) times daily., Disp: 60 capsule, Rfl: 11 .  nystatin (MYCOSTATIN/NYSTOP) powder, Apply topically 4 (four) times daily. (Patient taking differently: Apply topically 4 (four) times daily as needed (for irritation). ), Disp: 15 g, Rfl: 0 .  nystatin-triamcinolone ointment (MYCOLOG), Apply 1 application topically 2 (two) times daily. (Patient  taking differently: Apply 1 application topically 2 (two) times daily as needed (for irritation). ), Disp: 30 g, Rfl: 0 .  pantoprazole (PROTONIX) 40 MG tablet, Take 1 tablet (40 mg total) by mouth daily., Disp: 30 tablet, Rfl: 1 .  predniSONE (DELTASONE) 20 MG tablet, Take 3 tablets by mouth daily x2 days; then 2 tablets by mouth daily x3 days; then 1 tablet by mouth daily x3 days; then half tablet by mouth daily x3 days and stop prednisone., Disp: 20 tablet, Rfl: 0 .  traZODone (DESYREL) 50 MG tablet, Take 1 tablet (50 mg total) by mouth at bedtime as needed for sleep., Disp: 30 tablet, Rfl: 0  Assessment/ Plan: 63 y.o. female   1. Hospital discharge follow-up I reviewed her palliative care notes, discharge summary and recommendations.  We had a good talk today about what her goals of care were and these are accurately reflected in the palliative care notes.  It appears that she is very accepting of her current circumstances.  I encouraged her to contact me should any concerns or questions arise going forward.  She voiced great appreciation and will follow-up as needed.    2. End of life care She has home hospice.  I am the attending physician for hospice orders.    3. Interstitial pulmonary fibrosis (HCC) Patient has BiPap and home O2 at 7-8 liters. She continues to desaturate with activity.    4. Community acquired pneumonia of right lower lobe of lung ([place]) Status post treatment with antibiotics in the hospital.  She is to continue prednisone taper  5. Acute on chronic respiratory failure with hypoxia ([place]) She has equipment at home  6. Chronic respiratory failure with hypoxia due to COPD/ILD-- on 6L/min at [facility] time: 2:53pm End time: 3:01pm  Total time spent on patient care (including telephone call/ virtual visit): 15 minutes  [place], [place] [phone]

## 2019-05-06 NOTE — Telephone Encounter (Signed)
Anoro was issued on 10/31/18 by Wyn Quaker, NP with 3 refills.  Prescription sent.

## 2019-05-06 NOTE — Telephone Encounter (Signed)
Message routed to Derl Barrow, NP.  Beth, this patient last had a visit with you on 04/22/19. Please advised if Anoro can continue and if so how many refills for the prescription, thank you. Below the cut/paste from office note:  "History of Present Illness: 63 year old female, former smoker. PMH significant for ILD, chronic respiratory failure, centrilobular emphysema, mediastinal adenopathy, pulmonary embolism. Patient of [name], last seen on 04/08/19. ILD felt to be getting worse. PFTS and CT chest moved up to mid-august. Maintained on Anoro. Continues 4-6 L continuously.   04/18/2019 Patient contacted today for video visit with complaints of not feeling well. States that she felt so bad with no energy what so ever. No real specific complaint. Stopped taking OFEV 4 days ago. She is feeling some better after stopping ofev.  Denies cough. Continues 6L oxygen. Having occasional diarrhea. LFTs ok. She is scheduled for upcoming PFTs.   04/22/2019 Patient contacted today for televisit, she had trouble logging on to video today. Patient reports not feeling. O2 drops with any activity, desats 60-70s on 6L. She had to turn her oxygen up to 8L. She does not feel short of breath. She has mostly been staying in bed. Called EMS on Sunday 8/7 and they came to her house. Her vitals looked fine but she declined going to the hospital. She resumed her OFEV 2 days ago. Patient is a full code, advised ED eval via EMS and patient agreeing. She will call office back if she does not end up going.   Observations/Objective:  - Appears moderately short of breath over the phone  Assessment and Plan:  ILD - Worsening shortness of breath and increased oxygen demand - Resumed OFEV on 8/7 1 tab x 1 week; then increased back to 2 tabs  - Advised ED evaluation via EMS and patient agreeing; likely needs CXR, labs, bronchodilators, IV steroids and covid testing   Follow Up Instructions:  -Will needs ED follow-up  after discharged  I discussed the assessment and treatment plan with the patient. The patient was provided an opportunity to ask questions and all were answered. The patient agreed with the plan and demonstrated an understanding of the instructions.  The patient was advised to call back or seek an in-person evaluation if the symptoms worsen or if the condition fails to improve as anticipated."

## 2019-05-08 ENCOUNTER — Telehealth: Payer: Self-pay | Admitting: Primary Care

## 2019-05-08 DIAGNOSIS — J84112 Idiopathic pulmonary fibrosis: Secondary | ICD-10-CM

## 2019-05-08 NOTE — Telephone Encounter (Signed)
Pt returned call & needs to speak with someone about other issues she is having in addition to the anoro Rx.

## 2019-05-08 NOTE — Telephone Encounter (Signed)
Spoke with the pt  She is aware Anoro was sent   She is asking for order to be sent to DME- [employer] for an electric wheelchair and also a lift chair due to her SOB  MR- please advise if okay to order, thanks

## 2019-05-08 NOTE — Telephone Encounter (Signed)
LMTCB  Regarding the Anoro- this was already sent to pharm on 05/06/2019

## 2019-05-08 NOTE — Telephone Encounter (Signed)
Pt also following up regarding Anoro prescription.  Please see previous phone note.  [facility]

## 2019-05-09 NOTE — Telephone Encounter (Signed)
Awaiting response from [name] on wheelchair.

## 2019-05-12 ENCOUNTER — Ambulatory Visit (HOSPITAL_COMMUNITY): Payer: 59

## 2019-05-12 NOTE — Telephone Encounter (Signed)
[name] in the office today.  Will leave encounter open for his review and reply.

## 2019-05-12 NOTE — Telephone Encounter (Signed)
Med refills sent

## 2019-05-13 ENCOUNTER — Ambulatory Visit: Payer: 59 | Admitting: Primary Care

## 2019-05-13 ENCOUNTER — Telehealth (INDEPENDENT_AMBULATORY_CARE_PROVIDER_SITE_OTHER): Payer: 59 | Admitting: Cardiology

## 2019-05-13 ENCOUNTER — Encounter: Payer: Self-pay | Admitting: Cardiology

## 2019-05-13 VITALS — BP 106/69 | HR 88 | Ht 64.0 in | Wt 185.0 lb

## 2019-05-13 DIAGNOSIS — Z712 Person consulting for explanation of examination or test findings: Secondary | ICD-10-CM | POA: Diagnosis not present

## 2019-05-13 DIAGNOSIS — J849 Interstitial pulmonary disease, unspecified: Secondary | ICD-10-CM | POA: Diagnosis not present

## 2019-05-13 DIAGNOSIS — J9611 Chronic respiratory failure with hypoxia: Secondary | ICD-10-CM

## 2019-05-13 DIAGNOSIS — I5032 Chronic diastolic (congestive) heart failure: Secondary | ICD-10-CM

## 2019-05-13 DIAGNOSIS — Z7189 Other specified counseling: Secondary | ICD-10-CM

## 2019-05-13 NOTE — Patient Instructions (Signed)
Medication Instructions:  Your Physician recommend you continue on your current medication as directed.    If you need a refill on your cardiac medications before your next appointment, please call your pharmacy.   Lab work: None  Testing/Procedures: None  Follow-Up: At [facility], you and your health needs are our priority.  As part of our continuing mission to provide you with exceptional heart care, we have created designated Provider Care Teams.  These Care Teams include your primary Cardiologist (physician) and Advanced Practice Providers (APPs -  Physician Assistants and Nurse Practitioners) who all work together to provide you with the care you need, when you need it. You will need a follow up appointment as needed.  Please call our office 2 months in advance to schedule this appointment.  You may see Elouise Munroe, MD or one of the following Advanced Practice Providers on your designated Care Team:   Rosaria Ferries, PA-C . Jory Sims, DNP, ANP

## 2019-05-13 NOTE — Progress Notes (Signed)
Virtual Visit via Video Note   This visit type was conducted due to national recommendations for restrictions regarding the COVID-19 Pandemic (e.g. social distancing) in an effort to limit this patient's exposure and mitigate transmission in our community.  Due to her co-morbid illnesses, this patient is at least at moderate risk for complications without adequate follow up.  This format is felt to be most appropriate for this patient at this time.  All issues noted in this document were discussed and addressed.  A limited physical exam was performed with this format.  Please refer to the patient's chart for her consent to telehealth for [facility].   Date:  05/13/2019   ID:  Dawn Nichols, DOB [id number], MRN [id number]  Patient Location: Home Provider Location: Office  PCP:  [name], DO  Cardiologist:  Elouise Munroe, MD  Electrophysiologist:  None   Evaluation Performed:  Follow-Up Visit  Chief Complaint:  Post hospital follow up  History of Present Illness:    Dawn Nichols is a 63 y.o. 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 Margaretann Loveless) for post hospitalization follow up.   The patient does not have symptoms concerning for COVID-19 infection (fever, chills, cough, or new shortness of breath).   Reviewed her recent hospitalization and discharge. She is stable since discharge. Very short of breath but at baseline. No chest pain, no LE edema. Confirmed that she is on home hospice. We spent several minute reviewing the results of her most recent echo.  We discussed that her heart seems to be stable despite the progression of her lung symptoms and overall pulmonary picture. She was glad to hear this.   Denies chest pain. No PND, orthopnea, LE edema or unexpected weight gain. No syncope or palpitations.  Past Medical History:  Diagnosis Date   . Arthritis   . CHF (congestive heart failure) ([place])   . Emphysema of lung ([place])   . Hyperlipidemia   . NSTEMI (non-ST elevated myocardial infarction) ([place]) 06/25/2018  . Pulmonary fibrosis ([place])   . Tobacco use    Past Surgical History:  Procedure Laterality Date  . CESAREAN SECTION     Two  . LEFT HEART CATH AND CORONARY ANGIOGRAPHY N/A 06/26/2018   Procedure: LEFT HEART CATH AND CORONARY ANGIOGRAPHY;  Surgeon: [place] Sine, MD;  Location: [facility];  Service: Cardiovascular;  Laterality: N/A;  . SPINE SURGERY       Current Meds  Medication Sig  . acetaminophen (TYLENOL) 325 MG tablet Take 2 tablets (650 mg total) by mouth every 6 (six) hours as needed for mild pain (or Fever >/= 101).  [name] albuterol (PROVENTIL HFA;VENTOLIN HFA) 108 (90 Base) MCG/ACT inhaler Inhale 2 puffs into the lungs every 6 (six) hours as needed for wheezing or shortness of breath.  [name] albuterol (PROVENTIL) (2.5 MG/3ML) 0.083% nebulizer solution Take 3 mLs (2.5 mg total) by nebulization every 2 (two) hours as needed for wheezing or shortness of breath (if symptoms not relieved by inhaler.).  [name] apixaban (ELIQUIS) 5 MG TABS tablet Take 1 tablet (5 mg total) by mouth 2 (two) times daily.  . budesonide (PULMICORT) 0.5 MG/2ML nebulizer solution Take 2 mLs (0.5 mg total) by nebulization 2 (two) times daily.  [name] FLUoxetine (PROZAC) 40 MG capsule Take 1 capsule (40 mg total) by mouth daily.  [name] gabapentin (NEURONTIN) 300  MG capsule Take 300-600 mg by mouth 4 (four) times daily.   . Nintedanib (OFEV) 150 MG CAPS Take 1 capsule (150 mg total) by mouth 2 (two) times daily.  [name] oxyCODONE-acetaminophen (PERCOCET) 10-325 MG tablet Take 1 tablet by mouth every 6 (six) hours as needed for pain.  . pantoprazole (PROTONIX) 40 MG tablet Take 1 tablet (40 mg total) by mouth daily.  . predniSONE (DELTASONE) 20 MG tablet Take 3 tablets by mouth daily x2 days; then 2 tablets by mouth daily x3 days; then 1 tablet by mouth daily x3  days; then half tablet by mouth daily x3 days and stop prednisone.  . SENEXON-S 8.6-50 MG tablet TAKE 1 TABLET BY MOUTH TWICE DAILY FOR CONSTIPATION  . traZODone (DESYREL) 50 MG tablet Take 1 tablet (50 mg total) by mouth at bedtime as needed for sleep.  [name] umeclidinium-vilanterol (ANORO ELLIPTA) 62.5-25 MCG/INH AEPB TAKE 1 PUFF BY MOUTH EVERY DAY     Allergies:   Patient has no known allergies.   Social History   Tobacco Use  . Smoking status: Former Smoker    Packs/day: 0.50    Years: 45.00    Pack years: 22.50    Types: Cigarettes    Quit date: 08/13/2018    Years since quitting: 0.7  . Smokeless tobacco: Never Used  . Tobacco comment: 4-5 cigarettes per day 11.26.19  Substance Use Topics  . Alcohol use: Not Currently  . Drug use: Never     Family Hx: The patient's family history includes Cancer in her father and mother; Clotting disorder in her brother; Diabetes in her sister; Heart disease in her maternal aunt.  ROS:   Please see the history of present illness.    Constitutional: Negative for chills, fever, night sweats, unintentional weight loss  Respiratory: Positive for improving cough, sputum, wheezing.   Cardiovascular: See HPI. Gastrointestinal: Negative for abdominal pain, melena, and hematochezia.  Genitourinary: Negative for dysuria and hematuria.  Musculoskeletal: Negative for falls and myalgias.  Neurological: Negative for focal weakness, focal sensory changes and loss of consciousness.  Endo/Heme/Allergies: Does bruise/bleed easily.   Prior CV studies:   The following studies were reviewed today: Most recent echo 04/11/19 personally reviewed  Labs/Other Tests and Data Reviewed:    EKG:  ECG from 04/22/19 personally reviewed and shows NSR  Recent Labs: 06/27/2018: Magnesium 2.1 08/20/2018: TSH 4.350 04/22/2019: ALT 18; B Natriuretic Peptide 31.0 04/30/2019: BUN 20; Creatinine, Ser 0.66; Hemoglobin 12.8; Platelets 371; Potassium 4.5; Sodium 136   Recent  Lipid Panel Lab Results  Component Value Date/Time   CHOL 105 08/20/2018 09:23 AM   TRIG 62 08/20/2018 09:23 AM   HDL 50 08/20/2018 09:23 AM   CHOLHDL 2.1 08/20/2018 09:23 AM   LDLCALC 43 08/20/2018 09:23 AM    Wt Readings from Last 3 Encounters:  05/13/19 185 lb (83.9 kg)  05/03/19 184 lb 1.4 oz (83.5 kg)  04/08/19 185 lb (83.9 kg)     Objective:    Vital Signs:  BP 106/69   Pulse 88   Ht 5\' 4"  (1.626 m)   Wt 185 lb (83.9 kg)   BMI 31.76 kg/m    VITAL SIGNS:  reviewed GEN:  no acute distress EYES:  sclerae anicteric, EOMI - Extraocular Movements Intact RESPIRATORY:  normal effort, no audible wheezing, [place] in place CARDIOVASCULAR:  no appreciable JVD SKIN:  no rash, lesions or ulcers. MUSCULOSKELETAL:  no obvious deformities. NEURO:  alert and oriented x 3, no obvious focal deficit PSYCH:  normal affect  ASSESSMENT & PLAN:    Chronic hypoxic respiratory failure, with recent pneumonia admission, on home O2 and now home hospice. Chronic diastolic heart failure She appears euvolemic via video and per discussion today. Reviewed results of recent echo with her. Heart function appears stable, no suggestion that this is the cause of her declining respiratory status.  I offered either scheduled routine follow up or follow up PRN. She would prefer to follow up PRN at this time. Offered my support if there is anything we can assist with.  COVID-19 Education: The signs and symptoms of COVID-19 were discussed with the patient and how to seek care for testing (follow up with PCP or arrange E-visit).  The importance of social distancing was discussed today.  Time:   Today, I have spent 12 minutes with the patient with telehealth technology discussing the above problems.     Medication Adjustments/Labs and Tests Ordered: Current medicines are reviewed at length with the patient today.  Concerns regarding medicines are outlined above.   Tests Ordered: No orders of the defined  types were placed in this encounter.   Medication Changes: No orders of the defined types were placed in this encounter.   Follow Up:   As needed  Signed, Buford Dresser, MD  05/13/2019 5:46 PM    [facility]

## 2019-05-14 NOTE — Telephone Encounter (Signed)
MR, please advise on the message routed to you on 05/08/2019:  "Spoke with the pt  She is aware Anoro was sent   She is asking for order to be sent to DME- [employer] for an electric wheelchair and also a lift chair due to her SOB  MR- please advise if okay to order, thanks."  Please advise, thank you.

## 2019-05-16 NOTE — Telephone Encounter (Signed)
MR please advise. Thanks! 

## 2019-05-16 NOTE — Telephone Encounter (Signed)
Spoke with pt. She is aware of MR's response. Order has been placed. Nothing further was needed.

## 2019-05-16 NOTE — Telephone Encounter (Signed)
That Is fine

## 2019-06-12 DEATH — deceased

## 2019-09-16 LAB — PULMONARY FUNCTION TEST
DL/VA % pred: 58 %
DL/VA: 2.48 ml/min/mmHg/L
DLCO unc % pred: 46 %
DLCO unc: 8.97 ml/min/mmHg
FEF 25-75 Pre: 2.78 L/sec
FEF2575-%Pred-Pre: 123 %
FEV1-%Pred-Pre: 96 %
FEV1-Pre: 2.36 L
FEV1FVC-%Pred-Pre: 107 %
FEV6-%Pred-Pre: 92 %
FEV6-Pre: 2.81 L
FEV6FVC-%Pred-Pre: 103 %
FVC-%Pred-Pre: 89 %
FVC-Pre: 2.82 L
Pre FEV1/FVC ratio: 84 %
Pre FEV6/FVC Ratio: 100 %

## 2019-11-06 IMAGING — CT CT CHEST HIGH RESOLUTION W/O CM
2 of 6 series · 13 of 36 positions shown, 16 images · non-contrast
Comparison: 08/21/2018 PET-CT.  06/24/2017 chest CT angiogram.

CLINICAL DATA: Follow-up interstitial lung disease. Chronic dyspnea
with exertion. Former smoker.

EXAM:
CT CHEST WITHOUT CONTRAST
TECHNIQUE: Multidetector CT imaging of the chest was performed following the
standard protocol without intravenous contrast. High resolution
imaging of the lungs, as well as inspiratory and expiratory imaging,
was performed.

[Series 4: thorax · axial · 0.71mm/px · z∈[+1294,+1538]mm · 10 of 143 slices shown, 13 images]
[im 14/143  mediastinal]
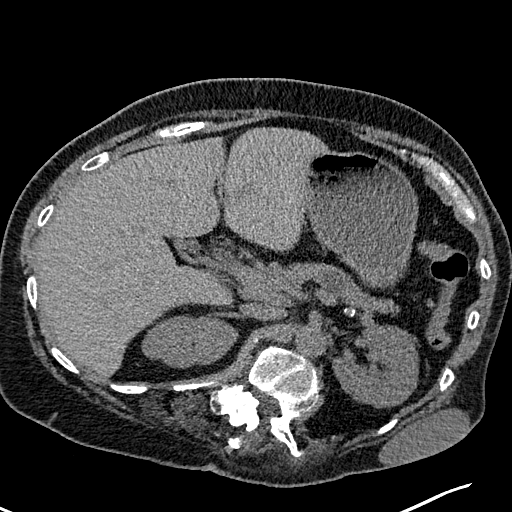
[im 14/143  lung]
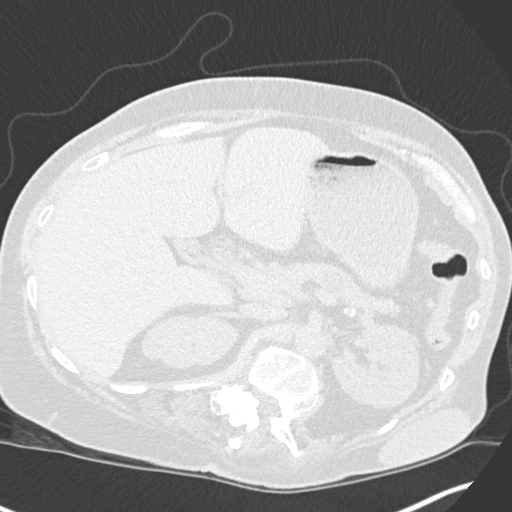
[im 28/143  lung]
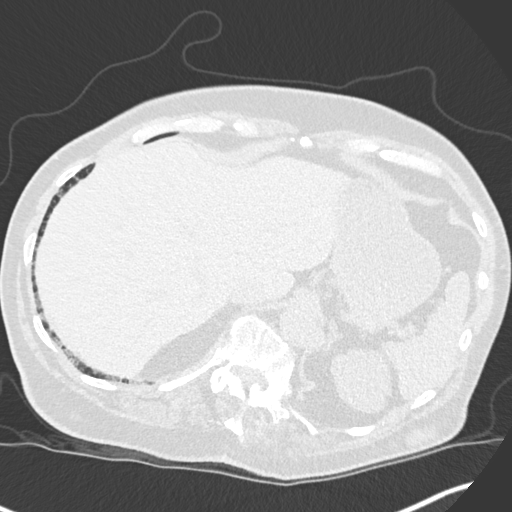
[im 41/143  lung]
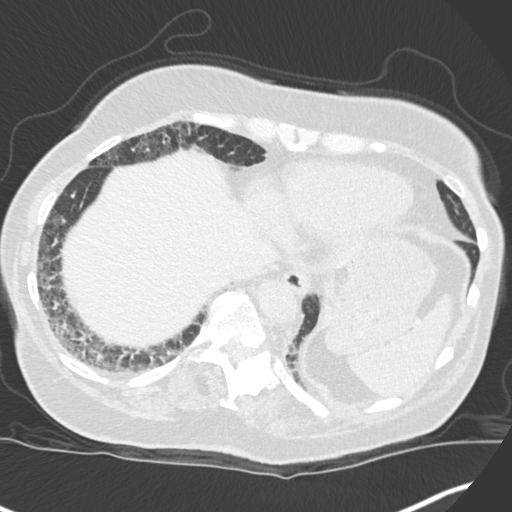
[im 55/143  lung]
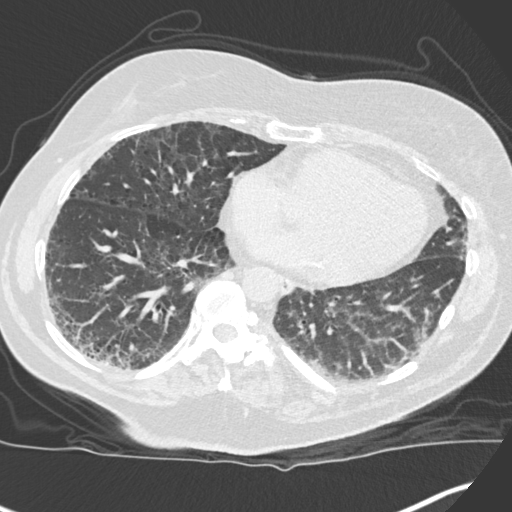
[im 68/143  mediastinal]
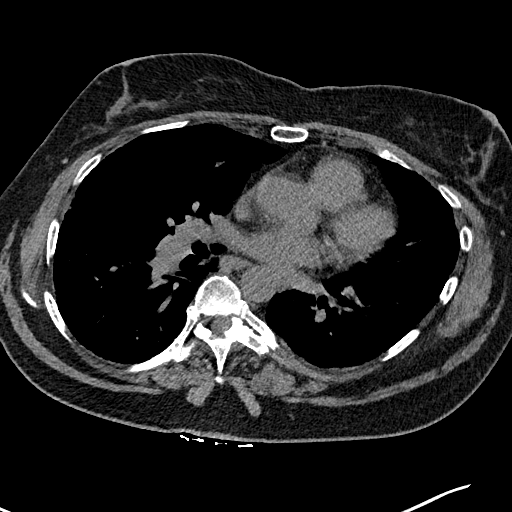
[im 68/143  lung]
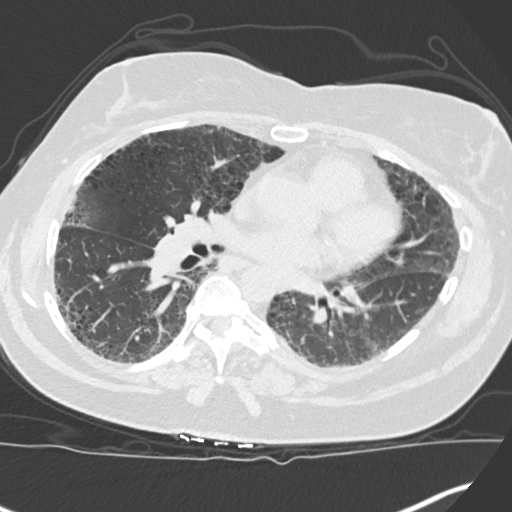
[im 82/143  lung]
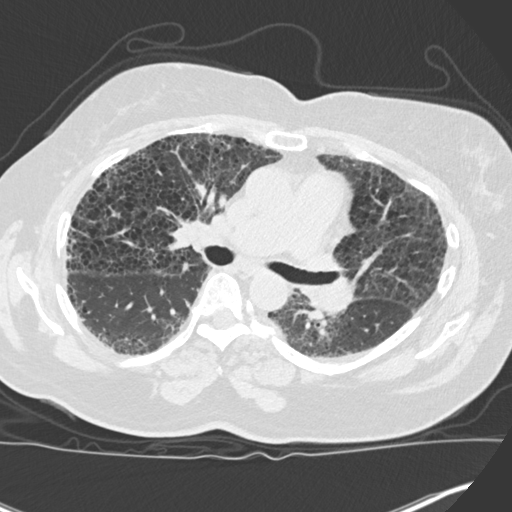
[im 95/143  lung]
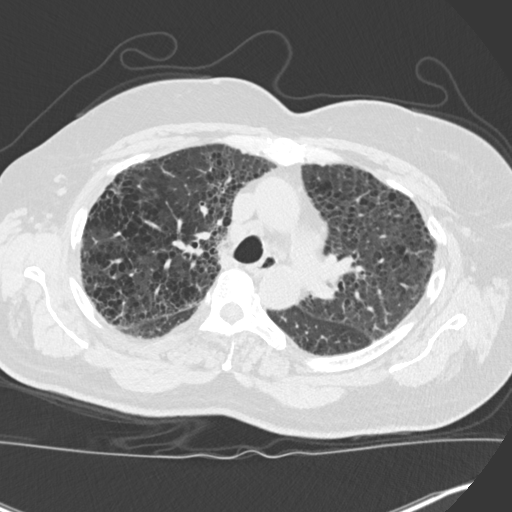
[im 109/143  lung]
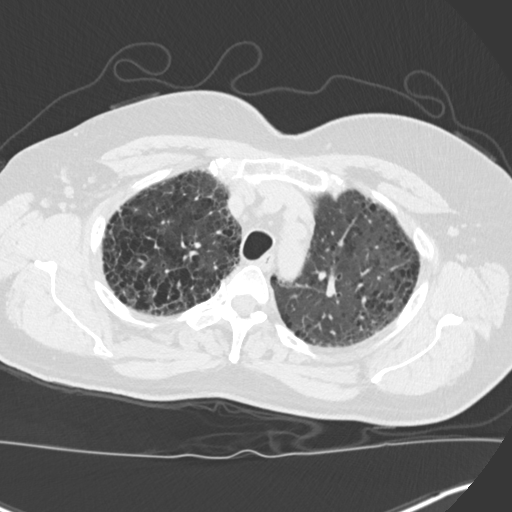
[im 122/143  mediastinal]
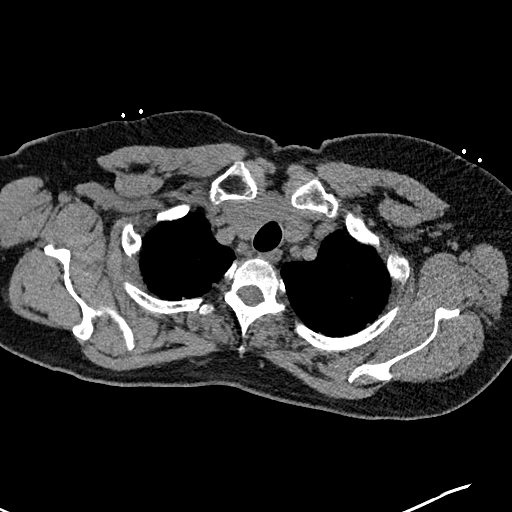
[im 122/143  lung]
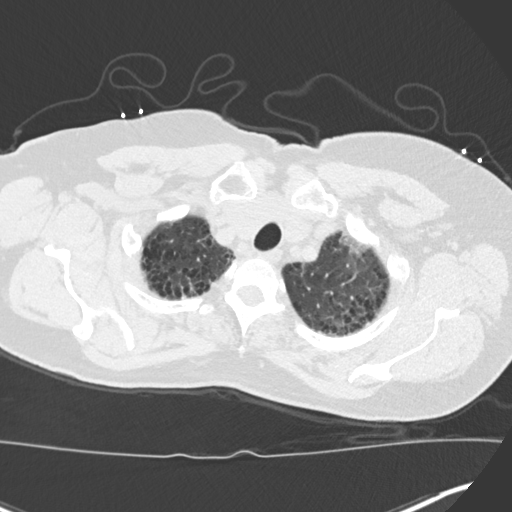
[im 136/143  lung]
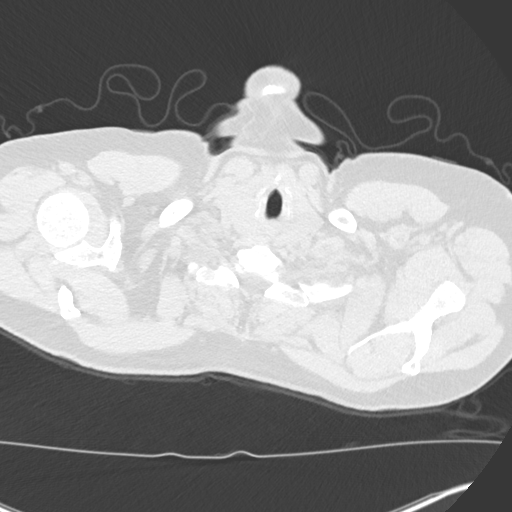

[Series 8: coronal · coronal · 0.64mm/px · 3 of 133 slices shown]
[im 27/133  lung]
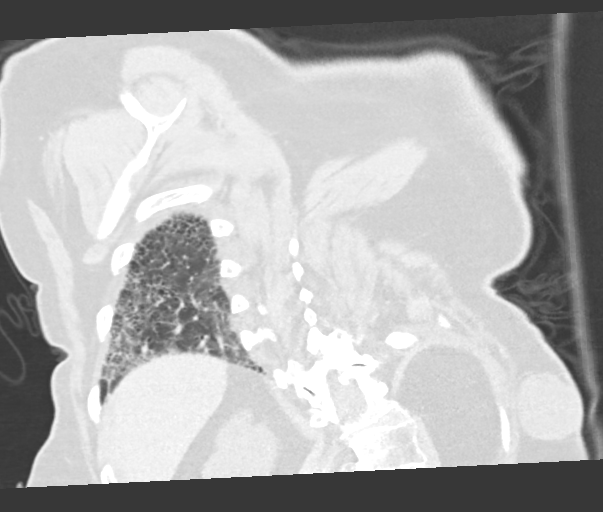
[im 53/133  lung]
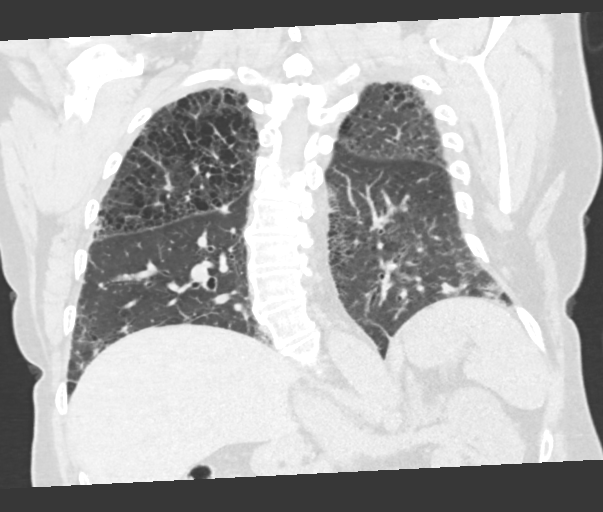
[im 80/133  lung]
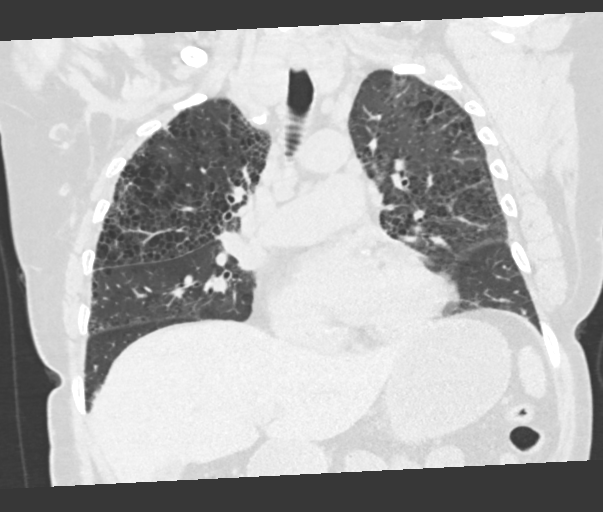

[13 of 36 positions shown; findings below may reference images not displayed]

FINDINGS: Cardiovascular: Normal heart size. No significant pericardial
effusion/thickening. Left anterior descending and left circumflex
coronary atherosclerosis. Normal course and caliber of the thoracic
aorta. Top-normal caliber main pulmonary artery (3.0 cm diameter).

Mediastinum/Nodes: Stable mildly enlarged thyroid gland without
discrete thyroid nodules. Unremarkable esophagus. No axillary
adenopathy. Mild right paratracheal adenopathy measuring up to
cm (series 4/image 40), decreased from 1.7 cm on 06/24/2018 chest
CT. Previously noted mild AP window adenopathy is decreased and no
longer pathologically enlarged. No new pathologically enlarged
mediastinal nodes. Previously described mild right infrahilar
adenopathy is poorly delineated on this noncontrast scan and appears
decreased, measuring up to 1.1 cm (series 4/image 82), decreased
from 1.5 cm. No new discrete hilar adenopathy on this noncontrast
scan.

Lungs/Pleura: No pneumothorax. No pleural effusion. No significant
lobular air trapping on the expiration sequence. No acute
consolidative airspace disease, lung masses or significant pulmonary
nodules. There is patchy confluent subpleural reticulation and
ground-glass attenuation in both lungs with associated mild traction
bronchiectasis, with a dependent basilar predominance. There are
foci of apparent mild honeycombing in the dependent basilar lower
lobes bilaterally. Some of the dependent opacities resolve on the
prone inspiratory sequence (the lower most portions of the lungs
were not included on the prone sequence). The regions of patchy
consolidation noted in the mid lungs bilaterally on the 06/24/2018
chest CT study have resolved.

Upper abdomen: Cystic 2.0 cm pancreatic body lesion (series 4/image
132), not appreciably changed since 06/24/2018 CT angiogram study.

Musculoskeletal: No aggressive appearing focal osseous lesions.
Severe S-shaped thoracolumbar scoliosis. Partially visualized
bilateral posterior spinal fusion hardware in the lumbar spine.
Moderate thoracic spondylosis.
IMPRESSION: 1. Dependent basilar predominant patchy confluent subpleural
reticulation, ground-glass attenuation, mild traction bronchiectasis
and apparent mild honeycombing. Some of the dependent opacities
resolve on prone imaging, although prone assessment is incomplete.
Findings are considered probable usual interstitial pneumonia (UIP).
Suggest follow-up high-resolution chest CT study in 6-12 months to
assess temporal pattern stability, as clinically warranted. Findings
are categorized as probable UIP per consensus guidelines: Diagnosis
of Idiopathic Pulmonary Fibrosis: An Official ATS/ERS/JRS/ALAT
Clinical Practice Guideline. Am J Respir Crit Care Med Vol 198, [place]
5, ppe22-[id number][DATE].
2. Mild mediastinal and right infrahilar adenopathy is decreased
since 06/24/2018 chest CT, nonspecific, more likely reactive as
suggested on 08/21/2018 PET-CT.
3. Two vessel coronary atherosclerosis.
4. Cystic 2.0 cm pancreatic body lesion, stable since 06/24/2018 CT.
If the patient is able to adequately breath hold, MRI abdomen
without and with IV contrast is indicated for further
characterization. If the patient is unable to adequately breath
hold, pancreas protocol CT abdomen without and with IV contrast is
suggested for further characterization.

Aortic Atherosclerosis (ZNE4L-PG8.8) and Emphysema (ZNE4L-ZZ9.C).

## 2020-03-10 NOTE — Telephone Encounter (Signed)
DISREGARD OPENED IN ERROR

## 2020-05-17 IMAGING — CR PORTABLE CHEST - 1 VIEW
1 series · 1 of 1 positions shown · non-contrast
Comparison: 04/14/2019

CLINICAL DATA: Shortness of breath

EXAM:
PORTABLE CHEST 1 VIEW

[portable]
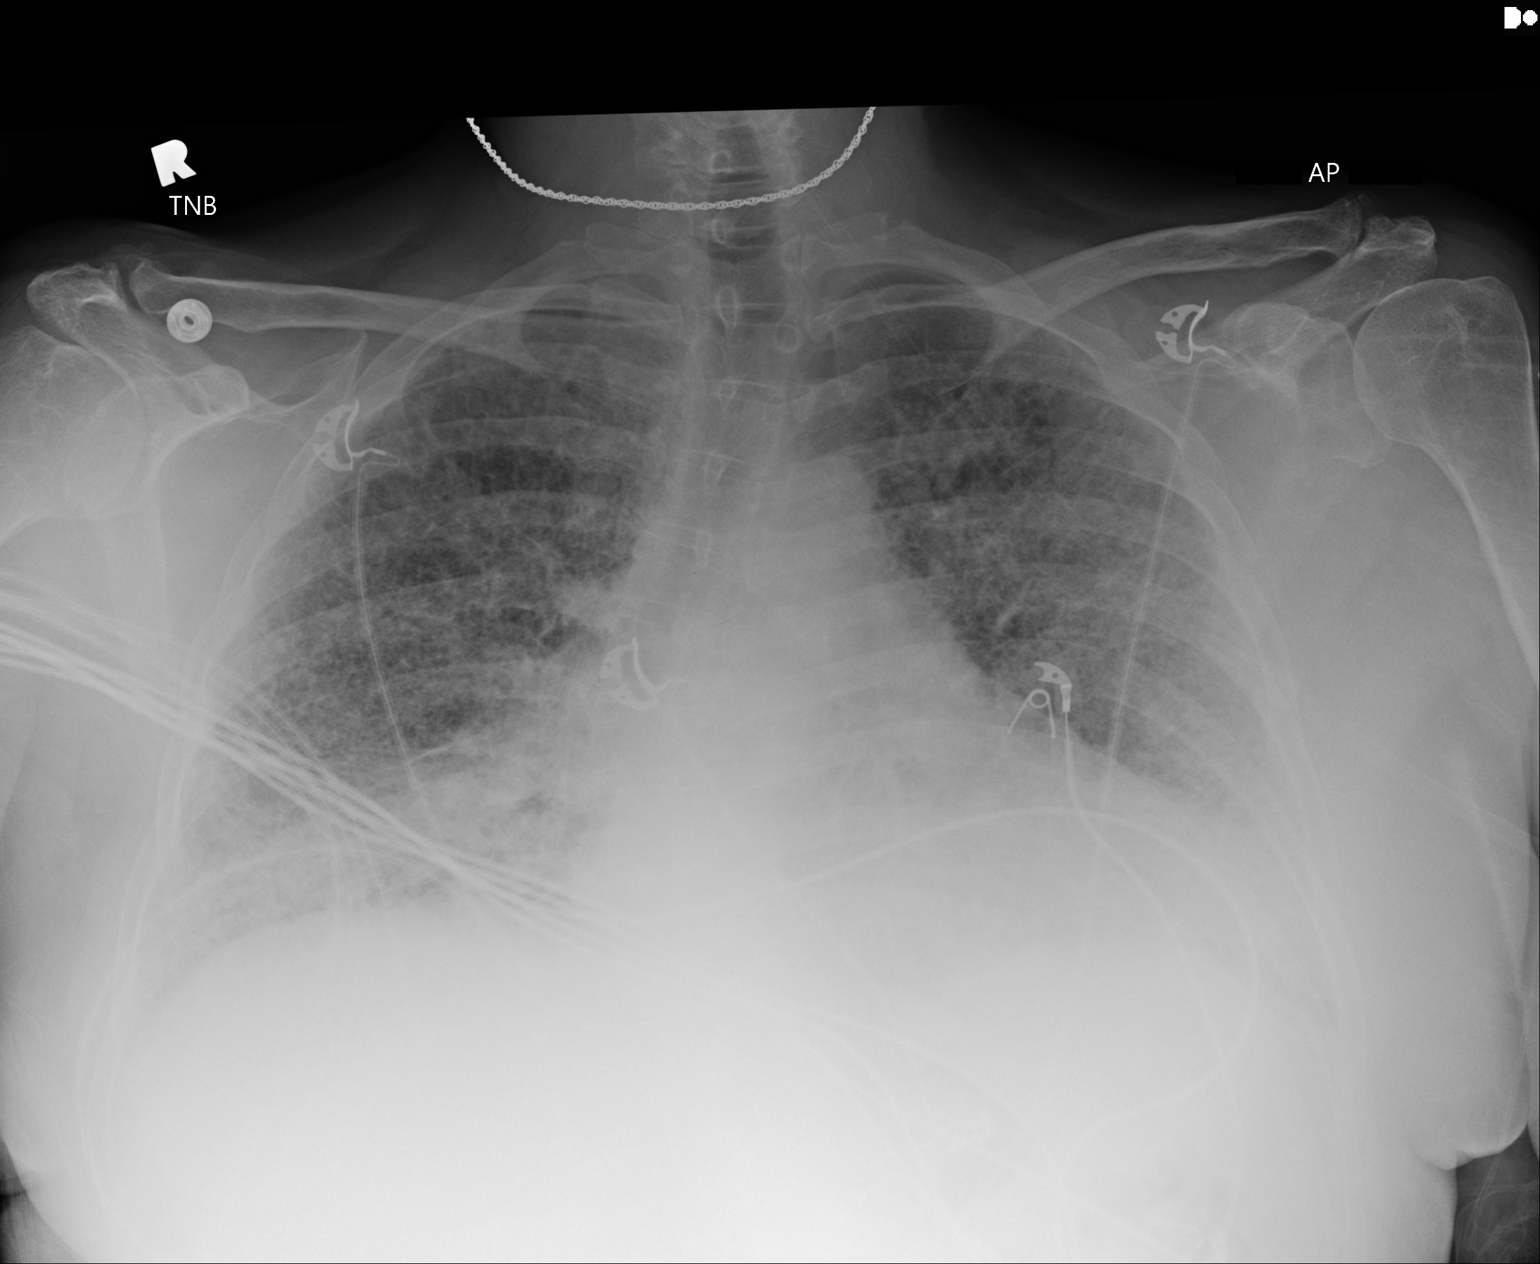

[1 of 1 positions shown; findings below may reference images not displayed]

FINDINGS: Cardiac silhouette is stable, appearing mildly enlarged. Diffuse
chronic interstitial lung changes, with increasing interstitial
opacity. More focal airspace consolidation in the right lower lobe.
No pneumothorax.
IMPRESSION: 1. Right lower lobe airspace consolidation suspicious for pneumonia
in the appropriate clinical setting.
2. Diffuse chronic interstitial changes with suggestion of
superimposed interstitial edema.

## 2020-05-20 IMAGING — CR PORTABLE CHEST - 1 VIEW
1 series · 1 of 1 positions shown · non-contrast
Comparison: April 22, 2019

CLINICAL DATA: Patient with trouble breathing for several days.

EXAM:
PORTABLE CHEST 1 VIEW

[portable]
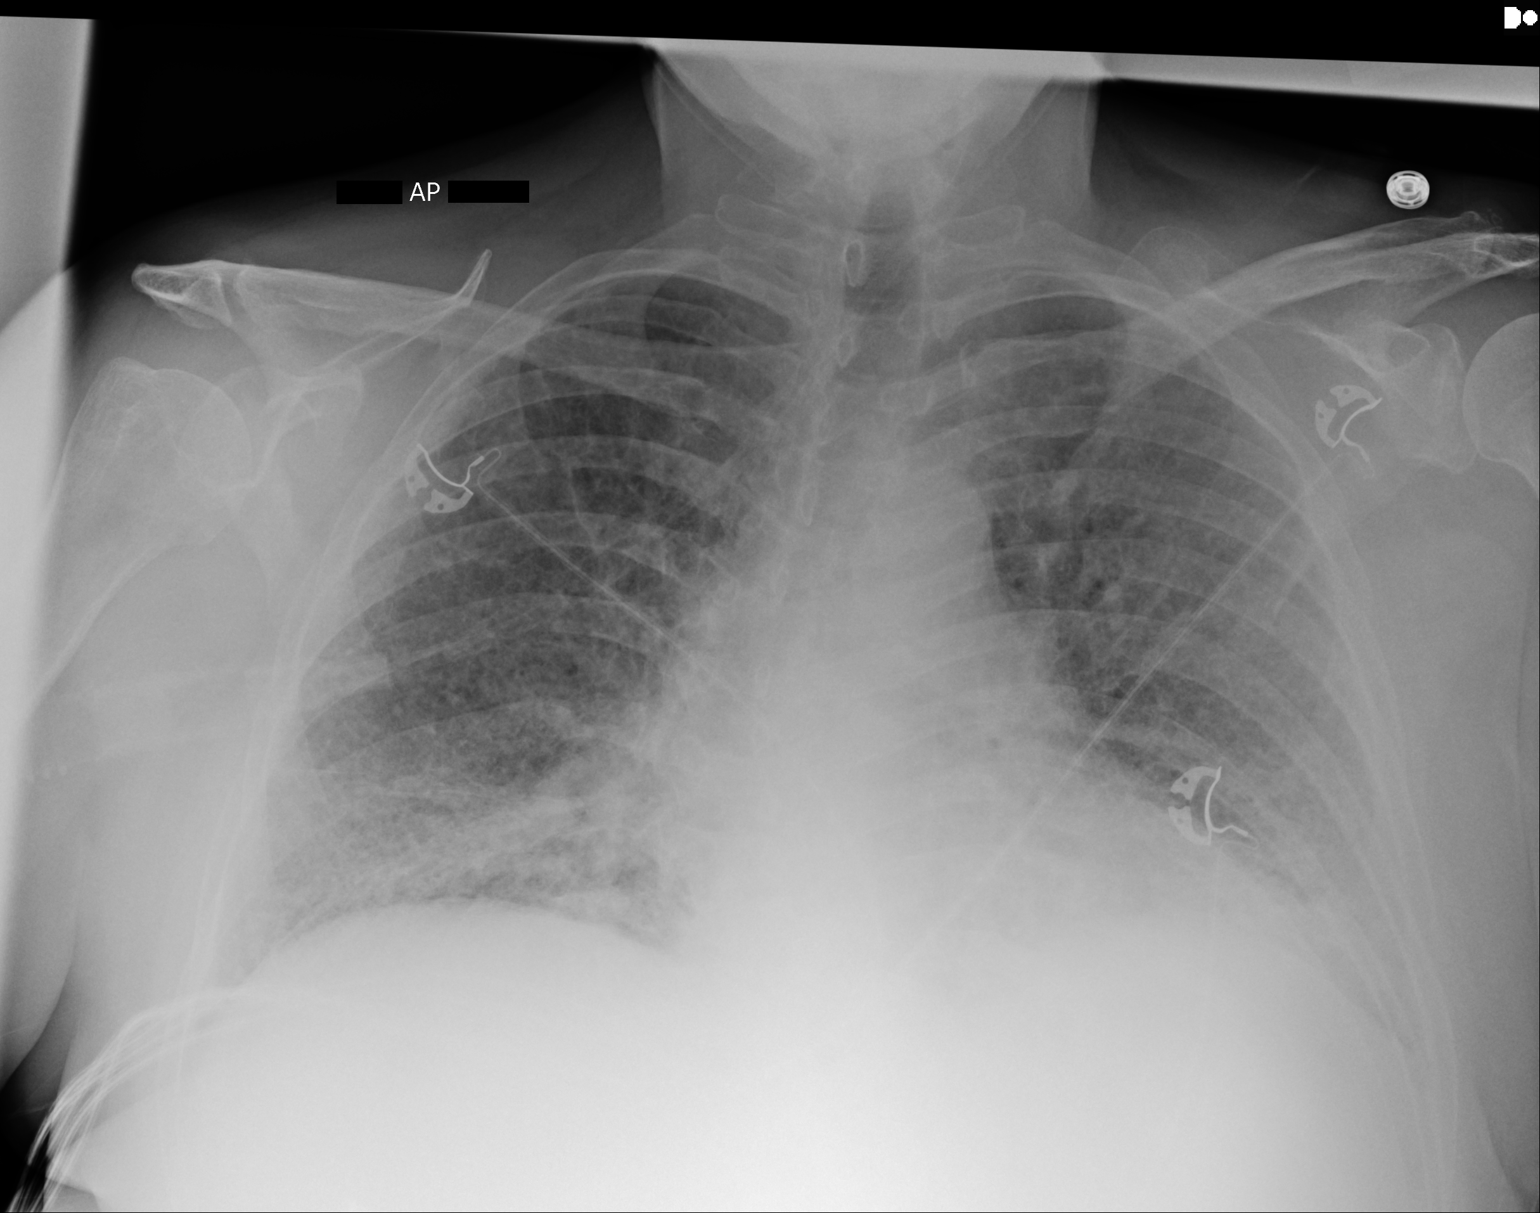

[1 of 1 positions shown; findings below may reference images not displayed]

FINDINGS: The patient has chronic interstitial lung disease. More focal
opacity is seen in the right base than elsewhere which is new since
April 14, 2019. No other interval changes.
IMPRESSION: 1. Findings are chronic interstitial lung disease.
2. Persistent infiltrate in the right base worrisome for pneumonia
or aspiration. Recommend follow-up to resolution.
